# Patient Record
Sex: Female | Born: 1940 | Race: White | Hispanic: No | Marital: Married | Smoking: Never smoker
Health system: Southern US, Community
[De-identification: ages and names within clinical notes are randomized; demographics above are authoritative.]

## PROBLEM LIST (undated history)

## (undated) DIAGNOSIS — M1990 Unspecified osteoarthritis, unspecified site: Secondary | ICD-10-CM

## (undated) DIAGNOSIS — R011 Cardiac murmur, unspecified: Secondary | ICD-10-CM

## (undated) DIAGNOSIS — I639 Cerebral infarction, unspecified: Secondary | ICD-10-CM

## (undated) HISTORY — DX: Cerebral infarction, unspecified: I63.9

## (undated) HISTORY — PX: APPENDECTOMY: SHX54

## (undated) HISTORY — PX: ABDOMINAL HYSTERECTOMY: SHX81

---

## 1999-07-15 ENCOUNTER — Encounter (INDEPENDENT_AMBULATORY_CARE_PROVIDER_SITE_OTHER): Payer: Self-pay | Admitting: Specialist

## 1999-07-15 ENCOUNTER — Observation Stay (HOSPITAL_COMMUNITY): Admission: RE | Admit: 1999-07-15 | Discharge: 1999-07-16 | Payer: Self-pay | Admitting: Orthopedic Surgery

## 1999-08-17 ENCOUNTER — Other Ambulatory Visit: Admission: RE | Admit: 1999-08-17 | Discharge: 1999-08-17 | Payer: Self-pay | Admitting: Obstetrics & Gynecology

## 2000-09-07 ENCOUNTER — Other Ambulatory Visit: Admission: RE | Admit: 2000-09-07 | Discharge: 2000-09-07 | Payer: Self-pay | Admitting: Obstetrics & Gynecology

## 2001-09-07 ENCOUNTER — Ambulatory Visit (HOSPITAL_COMMUNITY): Admission: RE | Admit: 2001-09-07 | Discharge: 2001-09-07 | Payer: Self-pay | Admitting: Gastroenterology

## 2001-09-07 ENCOUNTER — Encounter (INDEPENDENT_AMBULATORY_CARE_PROVIDER_SITE_OTHER): Payer: Self-pay

## 2001-09-17 ENCOUNTER — Other Ambulatory Visit: Admission: RE | Admit: 2001-09-17 | Discharge: 2001-09-17 | Payer: Self-pay | Admitting: Obstetrics & Gynecology

## 2002-03-04 ENCOUNTER — Observation Stay (HOSPITAL_COMMUNITY): Admission: RE | Admit: 2002-03-04 | Discharge: 2002-03-05 | Payer: Self-pay | Admitting: Obstetrics & Gynecology

## 2002-11-11 ENCOUNTER — Other Ambulatory Visit: Admission: RE | Admit: 2002-11-11 | Discharge: 2002-11-11 | Payer: Self-pay | Admitting: Obstetrics & Gynecology

## 2003-10-21 ENCOUNTER — Other Ambulatory Visit: Admission: RE | Admit: 2003-10-21 | Discharge: 2003-10-21 | Payer: Self-pay | Admitting: Obstetrics & Gynecology

## 2004-11-14 HISTORY — PX: JOINT REPLACEMENT: SHX530

## 2004-11-25 ENCOUNTER — Other Ambulatory Visit: Admission: RE | Admit: 2004-11-25 | Discharge: 2004-11-25 | Payer: Self-pay | Admitting: Obstetrics & Gynecology

## 2004-12-10 ENCOUNTER — Encounter: Admission: RE | Admit: 2004-12-10 | Discharge: 2004-12-10 | Payer: Self-pay | Admitting: Neurosurgery

## 2004-12-24 ENCOUNTER — Encounter: Admission: RE | Admit: 2004-12-24 | Discharge: 2004-12-24 | Payer: Self-pay | Admitting: Neurosurgery

## 2005-01-06 ENCOUNTER — Encounter: Admission: RE | Admit: 2005-01-06 | Discharge: 2005-01-06 | Payer: Self-pay | Admitting: Neurosurgery

## 2005-02-17 ENCOUNTER — Encounter: Admission: RE | Admit: 2005-02-17 | Discharge: 2005-02-17 | Payer: Self-pay | Admitting: Neurosurgery

## 2005-03-15 ENCOUNTER — Encounter: Admission: RE | Admit: 2005-03-15 | Discharge: 2005-03-15 | Payer: Self-pay | Admitting: Orthopedic Surgery

## 2005-05-24 ENCOUNTER — Inpatient Hospital Stay (HOSPITAL_COMMUNITY): Admission: RE | Admit: 2005-05-24 | Discharge: 2005-05-26 | Payer: Self-pay | Admitting: Orthopedic Surgery

## 2005-12-22 ENCOUNTER — Other Ambulatory Visit: Admission: RE | Admit: 2005-12-22 | Discharge: 2005-12-22 | Payer: Self-pay | Admitting: Obstetrics & Gynecology

## 2007-10-15 HISTORY — PX: JOINT REPLACEMENT: SHX530

## 2008-10-28 ENCOUNTER — Inpatient Hospital Stay (HOSPITAL_COMMUNITY): Admission: RE | Admit: 2008-10-28 | Discharge: 2008-10-30 | Payer: Self-pay | Admitting: Orthopedic Surgery

## 2009-09-29 ENCOUNTER — Encounter: Admission: RE | Admit: 2009-09-29 | Discharge: 2009-09-29 | Payer: Self-pay | Admitting: Orthopedic Surgery

## 2009-10-19 ENCOUNTER — Encounter (INDEPENDENT_AMBULATORY_CARE_PROVIDER_SITE_OTHER): Payer: Self-pay | Admitting: Orthopedic Surgery

## 2009-10-19 ENCOUNTER — Inpatient Hospital Stay (HOSPITAL_COMMUNITY): Admission: RE | Admit: 2009-10-19 | Discharge: 2009-10-21 | Payer: Self-pay | Admitting: Orthopedic Surgery

## 2010-01-11 ENCOUNTER — Inpatient Hospital Stay (HOSPITAL_COMMUNITY): Admission: RE | Admit: 2010-01-11 | Discharge: 2010-01-12 | Payer: Self-pay | Admitting: Orthopedic Surgery

## 2010-02-12 HISTORY — PX: EYE SURGERY: SHX253

## 2010-08-23 ENCOUNTER — Encounter
Admission: RE | Admit: 2010-08-23 | Discharge: 2010-09-02 | Payer: Self-pay | Source: Home / Self Care | Admitting: Orthopedic Surgery

## 2011-02-02 LAB — BASIC METABOLIC PANEL
BUN: 17 mg/dL (ref 6–23)
Creatinine, Ser: 0.53 mg/dL (ref 0.4–1.2)
GFR calc Af Amer: >60 mL/min (ref >60)
GFR calc non Af Amer: >60 mL/min (ref >60)

## 2011-02-02 LAB — TYPE AND SCREEN
ABO/RH(D): A POS
Antibody Screen: NEGATIVE

## 2011-02-02 LAB — URINALYSIS, ROUTINE W REFLEX MICROSCOPIC
Ketones, ur: NEGATIVE mg/dL
Nitrite: NEGATIVE
Protein, ur: NEGATIVE mg/dL
pH: 7 (ref 5.0–8.0)

## 2011-02-02 LAB — DIFFERENTIAL
Eosinophils Absolute: 0.1 10*3/uL (ref 0.0–0.7)
Lymphocytes Relative: 26 % (ref 12–46)
Lymphs Abs: 0.9 10*3/uL (ref 0.7–4.0)
Neutro Abs: 2 10*3/uL (ref 1.7–7.7)
Neutrophils Relative %: 57 % (ref 43–77)

## 2011-02-02 LAB — CBC
Platelets: 347 10*3/uL (ref 150–400)
WBC: 3.6 10*3/uL — ABNORMAL LOW (ref 4.0–10.5)

## 2011-02-02 LAB — APTT: aPTT: 29 seconds (ref 24–37)

## 2011-02-02 LAB — PROTIME-INR
INR: 1.01 (ref 0.00–1.49)
Prothrombin Time: 13.2 seconds (ref 11.6–15.2)

## 2011-02-06 LAB — BASIC METABOLIC PANEL
BUN: 8 mg/dL (ref 6–23)
GFR calc non Af Amer: >60 mL/min (ref >60)
Glucose, Bld: 98 mg/dL (ref 70–99)
Potassium: 4.3 mEq/L (ref 3.5–5.1)

## 2011-02-06 LAB — CBC
HCT: 27.6 % — ABNORMAL LOW (ref 36.0–46.0)
Platelets: 235 10*3/uL (ref 150–400)
RDW: 14.9 % (ref 11.5–15.5)

## 2011-02-15 LAB — BASIC METABOLIC PANEL
CO2: 23 mEq/L (ref 19–32)
CO2: 24 mEq/L (ref 19–32)
Calcium: 8.1 mg/dL — ABNORMAL LOW (ref 8.4–10.5)
Chloride: 101 mEq/L (ref 96–112)
Chloride: 102 mEq/L (ref 96–112)
Creatinine, Ser: 0.62 mg/dL (ref 0.4–1.2)
GFR calc Af Amer: >60 mL/min (ref >60)
GFR calc Af Amer: >60 mL/min (ref >60)
GFR calc non Af Amer: >60 mL/min (ref >60)
Glucose, Bld: 98 mg/dL (ref 70–99)
Potassium: 4.1 mEq/L (ref 3.5–5.1)
Potassium: 4.2 mEq/L (ref 3.5–5.1)
Sodium: 131 mEq/L — ABNORMAL LOW (ref 135–145)
Sodium: 136 mEq/L (ref 135–145)

## 2011-02-15 LAB — URINE MICROSCOPIC-ADD ON

## 2011-02-15 LAB — TYPE AND SCREEN: Antibody Screen: NEGATIVE

## 2011-02-15 LAB — CBC
HCT: 31.2 % — ABNORMAL LOW (ref 36.0–46.0)
HCT: 41.4 % (ref 36.0–46.0)
Hemoglobin: 10.5 g/dL — ABNORMAL LOW (ref 12.0–15.0)
Hemoglobin: 14 g/dL (ref 12.0–15.0)
MCHC: 33.6 g/dL (ref 30.0–36.0)
MCHC: 33.9 g/dL (ref 30.0–36.0)
MCV: 90.6 fL (ref 78.0–100.0)
MCV: 90.7 fL (ref 78.0–100.0)
Platelets: 307 10*3/uL (ref 150–400)
RBC: 3.45 MIL/uL — ABNORMAL LOW (ref 3.87–5.11)
RDW: 13.7 % (ref 11.5–15.5)
WBC: 4.7 10*3/uL (ref 4.0–10.5)
WBC: 5.8 10*3/uL (ref 4.0–10.5)

## 2011-02-15 LAB — WOUND CULTURE

## 2011-02-15 LAB — URINALYSIS, ROUTINE W REFLEX MICROSCOPIC
Glucose, UA: NEGATIVE mg/dL
Nitrite: NEGATIVE
Protein, ur: NEGATIVE mg/dL

## 2011-02-15 LAB — GRAM STAIN

## 2011-02-15 LAB — PROTIME-INR: Prothrombin Time: 13 seconds (ref 11.6–15.2)

## 2011-02-15 LAB — ANAEROBIC CULTURE

## 2011-02-15 LAB — DIFFERENTIAL
Eosinophils Relative: 1 % (ref 0–5)
Lymphocytes Relative: 22 % (ref 12–46)
Lymphs Abs: 1 10*3/uL (ref 0.7–4.0)
Monocytes Absolute: 0.4 10*3/uL (ref 0.1–1.0)

## 2011-02-15 LAB — APTT: aPTT: 29 seconds (ref 24–37)

## 2011-03-29 NOTE — Op Note (Signed)
[name], LEJEUNE NO.:  [id number]   MEDICAL RECORD NO.:  [id number]          PATIENT TYPE:  INP   LOCATION:  1614                         FACILITY:  [facility]   PHYSICIAN:  Madlyn Frankel. Charlann Boxer, M.D.  DATE OF BIRTH:  1941-09-10   DATE OF PROCEDURE:  10/28/2008  DATE OF DISCHARGE:                               OPERATIVE REPORT   PREOPERATIVE DIAGNOSIS:  Right hip osteoarthritis.   POSTOPERATIVE DIAGNOSIS:  Right hip osteoarthritis.   PROCEDURE:  Right total hip replacement.   COMPONENTS USED:  DePuy hip system size 46 ASR cup, a 2 high Trilock  stem with a 41 +2 SR ball and adapter.   SURGEON:  Madlyn Frankel. Charlann Boxer, M.D.   ASSISTANT:  Yetta Glassman. Loreta Ave, PA   ANESTHESIA:  General.   BLOOD LOSS:  400.   DRAINS:  One Hemovac.   COMPLICATIONS:  None.   SPECIMENS:  None.   INDICATIONS FOR PROCEDURE:  [name] is a very pleasant 70 year old  patient of mine with a history of left total hip replacement.  She  presented recently with progressive arthritic changes noted  radiographically.  She became more and more symptomatic with this and  decided to proceed with hip replacement surgery.  We reviewed the risks  of infection, DVT, component failure, dislocation of the bearing surface  tissues.  Consent was obtained for the right total hip replacement.   PROCEDURE IN DETAIL:  The patient was brought to the operative theater.  Once adequate anesthesia preoperative antibiotics administered the  patient was positioned carefully in the left lateral decubitus position  with the right side up.  We pre scrubbed the right lateral hip and then  prepped and draped in sterile fashion.  Time-out was performed  identifying the patient and extremity.  The lateral based incision was  then made on the lateral side of the hip.  Sharp dissection was carried  down to the iliotibial band and gluteal fascia.  The posterior incision  was then made for posterior approach to the hip.  The  short external  rotators were identified and taken down separate from the posterior  capsule.  An L capsulotomy was made preserving the posterior capsule to  protect the sciatic nerve from retractors and also to repair  anatomically.  The hip was dislocated and neck osteotomy made utilizing  a broach with a smaller ball identifying the center of the femoral head  to match the anatomy.   Severe degenerative changes were noted on the femoral and acetabular  side.  Following neck osteotomy attention was directed first to the  femur.  Femoral retractors were placed, using starting drill and hand  reamer once the irrigation I began broaching with a zero broach set at  anteversion of 15 degrees.  She was fairly neutral femoral neck and I  was able to get about 15 degrees anteversion.  I then broached up to  size 2 with good proximal fit and fill.  I then attended to the  acetabulum and packed off the femur with a sponge to prevent oozing.  Acetabular retractors were placed.  Labrum was removed.  I then began  reaming with a 43 reamer.  The pelvis was very small and I was only able  to ream comfortably with a 45 reamer.  I did not want to ream further as  I did not want to compromise her posterior wall.  Given this I went  ahead and chose to use an ASR component as we were using the  contralateral hip which was the same size at 46.  A 46 ASR cup was  impacted at approximately 35-40 degrees of abduction with posterior and  lateral aspect of the cup exposed.  It was beneath the anterior wall  anteriorly.  It appeared to be well seated with a circumferential bony  contact noted.  At this point I checked the cup position and was secured  into position of the cup.  The trial reduction now cut with a 2 broach  and a high offset neck and a 41 +2 adapter.  The hip appeared to be very  stable through all range of motion with a combined anteversion noted to  be about 45-50 degrees.  There was no evidence  of impingement with  forward flexion, internal rotation nor with external rotation or  extension.  Given these parameters the trial femoral components were  removed.  The final Trilock size 2 high offset stem was impacted.  It  sat at the level of the broach and based on this I used a 41 +2 adapter.  Hip was then reduced, the hip ball was impacted with a clean dry  trunnion, the hip was reduced.  Hip was irrigated throughout the case  and at this point I reapproximated the posterior capsule superior  leaflet using #1 Vicryl.  Medium Hemovac drain was placed deep.  The  remainder of the wound was closed with #1 Vicryl and the iliotibial band  and gluteal fascia, 2-0 Vicryl was used in the subcu layer followed by 4-  0 running Monocryl.  Hip was cleaned, dried, dressed sterilely with  Steri-Strips and a Mepilex dressing.  She was brought to the recovery  room in stable condition.      Madlyn Frankel Charlann Boxer, M.D.  Electronically Signed     MDO/MEDQ  D:  10/28/2008  T:  10/29/2008  Job:  [id number]

## 2011-03-29 NOTE — H&P (Signed)
NAME:  [name], [name] NO.:  [id number]   MEDICAL RECORD NO.:  [id number]          PATIENT TYPE:  INP   LOCATION:  NA                           FACILITY:  [facility]   PHYSICIAN:  Madlyn Frankel. Charlann Boxer, M.D.  DATE OF BIRTH:  1941-07-21   DATE OF ADMISSION:  10/28/2008  DATE OF DISCHARGE:                              HISTORY & PHYSICAL   PROCEDURE:  Right total hip replacement.   CHIEF COMPLAINTS:  Right hip pain.   HISTORY OF PRESENT ILLNESS:  A 70 year old female with a history of  right hip pain secondary to osteoarthritis.  This has been refractory to  all conservative treatment.  Has been presurgically assessed by her  primary care physician, Dr. Martha Clan with full cardiac evaluation by  [facility] and Vascular Dr. Alanda Amass.   Her primary care physician, Dr. Martha Clan.  Other physicians include  Dr. Varney Baas, Dr. Caprice Kluver.   PAST MEDICAL HISTORY:  Significant for  1. Osteoarthritis.  2. Heart murmur.   PAST SURGICAL HISTORY:  Left total hip replacement 2006, hysterectomy,  foot surgery, appendectomy, vein stripping.   MEDICATIONS:  1. Aspirin 81 mg daily  2. Multivitamin daily.  3. Vitamin D 3 400 units daily.  4. Calcium plus vitamin D daily.  5. Estropipate 81.5 mg daily.   REVIEW OF SYSTEMS:  None other than HPI.   PHYSICAL EXAMINATION:  VITAL SIGNS:  Pulse 72, respirations 16, blood  pressure 116/78.  GENERAL:  Awake, alert and oriented, well-developed, well-nourished, no  acute distress.  HEENT: Normocephalic.  NECK: Supple.  No carotid bruits.  CHEST/LUNGS:  Clear to auscultation bilaterally.  BREASTS:  Deferred.  HEART:  Regular rate and rhythm.  S1-S2 distinct.  ABDOMEN:  Soft, nontender, nondistended.  Bowel sounds present.  PELVIS:  Stable.  GENITOURINARY:  Deferred.  EXTREMITIES:  Right hip has decreased range of motion increased pain.  SKIN:  No cellulitis.  NEUROLOGIC:  Intact distal sensibilities.   LABORATORY DATA:   Labs, EKG, chest x-ray all pending presurgical  testing.   IMPRESSION:  Right hip osteoarthritis.   PLAN OF ACTION:  Right total hip replacement [facility] on  October 28, 2008 by surgeon Dr. Durene Romans.  Risks, complication were  discussed.   Postop medications were provided today including aspirin, Robaxin,  Celebrex, Ambien, iron, Colace, MiraLax.     ______________________________  Yetta Glassman. Loreta Ave, [place]      Madlyn Frankel. Charlann Boxer, M.D.  Electronically Signed    BLM/MEDQ  D:  10/22/2008  T:  10/23/2008  Job:  [id number]   cc:   Kari Baars, M.D.  Fax: [phone]   W. Varney Baas, M.D.  Fax: [phone]   Thereasa Solo. Little, M.D.  Fax: [phone]

## 2011-04-01 NOTE — Procedures (Signed)
[facility]  Patient:    [name], [name] Visit Number: [id number] MRN: [id number]          Service Type: END Location: ENDO Attending Physician:  Nelda Marseille Proc. Date: 09/07/01 Admit Date:  09/07/2001 Discharge Date: 09/07/2001   CC:         Izell Garrett. Elesa Hacker, M.D.  Desma Maxim, M.D.   Procedure Report  PROCEDURE:  Colonoscopy with polypectomy.  INDICATION:  Patient due for colonic screening.  Consent was signed after risks, benefits methods and options thoroughly discussed in the office.  MEDICATIONS:  Demerol 70, Versed 7.  DESCRIPTION OF PROCEDURE:  Rectal inspection is pertinent for small external hemorrhoids.  Digital exam was negative.  The pediatric video adjustable colonoscope was inserted and with minimal difficulty due to a tortuous, slightly looping colon, we were able to the cecum.  This did require some abdominal pressure and rolling her on her back.  On insertion in the sigmoid, a 2-3 mm polyp was seen, and one cold biopsy was obtained on insertion to mark the spot.  No other abnormalities were seen on insertion.  The cecum was identified by the appendiceal orifice and the ileocecal valve.  In fact, the scope was inserted a short ways into the terminal ileum which was normal. Photodocumentation was obtained.  The scope was slowly withdrawn.  The prep was adequate.  There was minimal liquid stool that required washing and suctioning.  On slow withdrawal through the colon, in both the distal ascending and proximal transverse, around the transverse were three tiny polyps, all of which were hot biopsied and put in the second container.  The scope was further withdrawn.  No additional polyps were seen as we slowly withdrew back to the sigmoid where we found the polyp seen on insertion, and this was hot biopsied x 2 and put in the first container.  The scope was further withdrawn.  No additional findings were seen as we  withdrew back to the rectum.  Once back in the rectum, the scope was retroflexed, pertinent for some internal hemorrhoids.  The scope was then straightened, air was suctioned, and the scope removed.  The patient tolerated the procedure well. There was no obvious immediate complications.  ENDOSCOPIC DIAGNOSES: 1. Internal and external small hemorrhoids. 2. Sigmoid smaller polyp hot biopsied on withdrawal, cold biopsied on    insertion. 3. Three tiny approximate hepatic flexure polyps, hot biopsied put in a second    container. 4. Otherwise within normal limits to the terminal ileum.  PLAN:  Await pathology to determine future colonic screening.  Happy to see back p.r.n.  Otherwise return care to Dr. Arvilla Market for the customary health care maintenance to include yearly rectals and guaiacs. Attending Physician:  Nelda Marseille DD:  09/07/01 TD:  09/09/01 Job: [id number]

## 2011-04-01 NOTE — Discharge Summary (Signed)
[name], [name] NO.:  [id number]   MEDICAL RECORD NO.:  [id number]          PATIENT TYPE:  INP   LOCATION:  1514                         FACILITY:  [facility]   PHYSICIAN:  Madlyn Frankel. Charlann Boxer, M.D.  DATE OF BIRTH:  1941/11/13   DATE OF ADMISSION:  05/24/2005  DATE OF DISCHARGE:                                 DISCHARGE SUMMARY   ADMITTING DIAGNOSIS:  End-stage osteoarthritis of the left hip.   DISCHARGE DIAGNOSES:  1.  End-stage osteoarthritis of the left hip.  2.  Very mild postoperative anemia.   OPERATION:  On May 24, 2005 the patient underwent DePuy hip system total  left total hip replacement arthroplasty; D. Ashley Akin PA-C assisted.   BRIEF HISTORY:  This 69 year old lady had progressive and persistent left  hip pain which was interfering with her day-to-day activities. She is an  extremely active lady who enjoys a lifestyle filled with sports, hiking,  etc. and this has markedly interfered with her enjoyment of those  activities. X-rays have shown degenerative changes in the left hip. Due to  her age and progression of her discomfort a joint aspiration was performed  on the left hip and fortunately these were all negative. [name] wanted  a definitive treatment for this problem, and after risks and benefits of  surgery were described to her she elected to go ahead with the above  procedure.   COURSE IN THE HOSPITAL:  The patient tolerated the surgical procedure quite  well, entered eagerly into the total hip protocol. She was able to maintain  50% weightbearing on the operative extremity with no difficulty whatsoever.  She accomplished a considerable amount of activity, ambulating in the hall  with minimal discomfort. She had an extremely positive attitude throughout  her hospital stay.   She was placed on Coumadin protocol postoperatively for prevention of DVT.   The wound remained dry without evidence of any infection. Calf was soft.  Neurovascular intact to the operative extremity. Once home health was  arranged and the durable medical equipment needed for home was ordered, it  was felt she could be maintained in her home environment and arrangements  were made for discharge.   LABORATORY VALUES IN THE HOSPITAL:  Hematologically showed a CBC  preoperatively which was completely within normal limits. Hemoglobin was  13.4, hematocrit was 38.9. Final hemoglobin showed a hemoglobin of 10.8 with  hematocrit of 30.7. Blood chemistries remained normal in her  hospitalization. Chest x-ray showed old calcified granulomatous changes with  no active disease and moderate scoliosis. Electrocardiogram was normal sinus  rhythm.   CONDITION ON DISCHARGE:  Improved, stable.   PLAN:  The patient is discharged to her home in the care of her family. She  is to continue with 50% weightbearing to the operative extremity for at  least 4-6 weeks. Return to see Dr. Charlann Boxer 2 weeks after the date of surgery.  May shower in 5 days, trying to keep the wound dry. Use dry dressing p.r.n.  Continue with home medications and diet. Prescriptions written at discharge  were Vicodin for discomfort, Robaxin  for muscle spasm, Coumadin for the  Coumadin protocol to use 4 weeks after the date of surgery, and Trinsicon  one b.i.d. x3 weeks. Recommend she get  an over-the-counter stool softener to take one daily while on iron.  Encouraged her to call us should she have any problems or questions. She is  a little apprehensive concerning the hip protocol and her day-to-day life. I  reassured her that the [facility] would diligently go step by step  with her concerning those precautions.       DLU/MEDQ  D:  05/26/2005  T:  05/26/2005  Job:  [id number]   cc:   Donia Guiles, M.D.  [address] [place]  [place] [address]  Fax: [phone]

## 2011-04-01 NOTE — Op Note (Signed)
NAME:  [name], GLADHILL NO.:  [id number]   MEDICAL RECORD NO.:  [id number]          PATIENT TYPE:  INP   LOCATION:  0001                         FACILITY:  [facility]   PHYSICIAN:  Madlyn Frankel. Charlann Boxer, M.D.  DATE OF BIRTH:  05/27/1941   DATE OF PROCEDURE:  05/24/2005  DATE OF DISCHARGE:                                 OPERATIVE REPORT   PREOPERATIVE DIAGNOSIS:  End-stage left hip osteoarthritis.   POSTOPERATIVE DIAGNOSIS:  End-stage left hip osteoarthritis.   PROCEDURE:  Left total hip replacement.   COMPONENT USED:  DePuy hip system with a 46 mm ASR cup with a femoral side S-  ROM 16 x 11, 36 plus 6 offset with a 16D small sleeve and a 41 mm ball with  a zero adaptor.   SURGEON:  Madlyn Frankel. Charlann Boxer, M.D.   ASSISTANT:  Cherly Beach, P.A.-C.   ANESTHESIA:  General.   SPECIMENS:  None.   ESTIMATED BLOOD LOSS:  200 mL.   DRAINS:  Drains x1.   COMPLICATIONS:  None.   INDICATIONS FOR PROCEDURE:  Ms. [name] is a very pleasant 70 year old female  who presented to the office for evaluation of left hip pain. She had been  worked up in the past for bursitis and back problems but x-ray had revealed  some evidence of degenerative changes in the left hip. She was referred for  evaluation. In the workup based on her age and progression of her discomfort  in her hip, a hip aspiration was performed to rule out infection and this  was negative. Based on these findings, conservative options were presented  to her. She feels they were not going to provide her any longstanding relief  and for that reason she chose to proceed with a left total hip replacement.  The risks and benefits were reviewed including bleeding, nerve damage,  dislocation, component failure. We spent time discussing bearing surfaces  including metal on metal, ceramic on ceramic reviewing the risks and  benefits of all. Consent was obtained for a left total hip replacement with  new technology ASR ball to  decrease wear and prevent dislocation.   DESCRIPTION OF PROCEDURE:  The patient was brought to the operative theater.  Once adequate anesthesia and preoperative antibiotics, 1 g of Ancef were  administered. The patient was positioned in the right lateral decubitus  position with the left side up. The left lower extremity was then prepped  and draped in a sterile fashion. This was followed in a prescrub with  Betadine.   Landmarks were identified and the lateral based incision was made for  posterior approach to the hip. Short external rotators were taken down  separate from the posterior capsule which was tagged and saved to help  protect the sciatic nerve and then later repaired with the superior leaflet.  Once the hip was exposed, the hip was dislocated. The patient's femoral head  was noted to have severe degenerative changes with deformity, flattening and  sclerosis with no cartilage over the superolateral surface. Next osteotomy  was then made. Preoperative templating revealed some asymmetry to the  femoral shaft compared to the metaphysis. For this reason, component  selection was an option. Based on her version which was very little in the  femoral neck and the size of her femoral neck proximally, I did not feel  that I had enough room to add anteversion with a Press-Fit wedge type stem.  For this reason, I chose to use the S-ROM components. Given this selection,  we proceeded with the acetabulum. Acetabular exposure was obtained in  routine fashion with retractors placed. Again the sciatic nerve protected  with a posterior capsular leaflet. With the acetabular exposed labrectomy  carried out, reaming commenced with a 43 reamer. I reamed down to the medial  wall and then reamed up sequentially to a 45 reamer with excellent purchase,  punctate bleeding bone. No cysts were visible for debridement. A 46 ASR cup  was then impacted to the level of the reaming. This was positioned in  the  anatomic position underneath the anterior wall. The cup position appeared to  be at about 40-45 degrees of abduction and 15-20 degrees of forward flexion.  Given the anatomy of her pelvis, this adduction was adequate as she will  flatten out in the standing position. With the final impaction and  inspection of the cup, attention was now directed to the femur. Femoral  exposure was obtained in a routine fashion with retractor placement. The  femur was prepared per protocol for the S-ROM stem with distal reaming to an  11 and then 3/4 of the way down 11.5. This gave me excellent cortical  purchase with good bone purchase. There was no way to proceed up further  than this. Proximal reaming was carried up to a D with good bony purchase  and stability. Milling was then carried out using the standard Hyacinth Meeker to a D  small. A trial 16D small sleeve was then placed and a trial reduction  carried out. Note that the reaming and positioning of the components was  based off of the tip of the trochanter. From preoperative templating, the  center of her femoral head was a few millimeters proximal to the tip of the  trochanter. We tried to mimic that in this point. With the plus 0 sleeve and  the 41 ball, the hip was extremely stable. Note that I did add initially 20  degrees of anteversion. This combined anteversion was perhaps greater than  25. I then backed the stem to a 10 degrees of anteversion giving me what I  felt to be femoral anteversion of about 20-25 degrees. With this in mind,  the hip was reduced. The hip was very stable with a combined anteversion of  about 45-50 degrees. The hip was stable in flexion, neutral abduction,  internal rotation to at least 80 degrees, stable in the sleep position with  adduction of 40 degrees and internal rotation at 30 degrees. The hip was  stable on extension and external rotation with no signs of impingement with amount of anteversion put in the system.  There was a millimeter of shucking.  The leg lengths appeared to be symmetric as determined from the preoperative  position of the lower extremities. Given the position of her pelvis again, I  did not try to match her knee position due to the adduction present. Given  the soft tissue tension and the position of components, I felt anatomic  position of her hip was restored. At this point, trial components removed,  the final components were brought onto the  field.   Final components were positioned with a 16D small sleeve followed by  positioning of the 16 x 11, 36 plus 6 stem adding about 10 degrees of  anteversion to the sleeve giving me again a combined femoral anteversion of  20-25 degrees. When the stem was impacted to its final level, the plus zero  adaptor was placed with the 41 head and then impacted onto a clean and dry  trunnion. The hip was reduced. The hip was copiously irrigated throughout  the course of the case and again at the end of this. The posterior capsule  was reapproximated at the superior leaflet. The medium hemovac drain was  then placed deep. The remainder of the wound was closed in layers. Using #1  Ethibond  on the iliotibial band, a running #1 Vicryl on the gluteus maximus fascia.  The wound was closed in layers with 4-0 Monocryl. The hip was then cleaned,  dried and dressed sterilely. Steri-Strips were applied, dressings and  sponges applied over adaptic. The patient was then extubated and brought to  the recovery room in stable condition.       MDO/MEDQ  D:  05/24/2005  T:  05/24/2005  Job:  [id number]

## 2011-04-01 NOTE — Op Note (Signed)
[facility]  Patient:    [name], [name] Visit Number: [id number] MRN: [id number]          Service Type: DSU Location: [phone] Attending Physician:  Minette Headland Dictated by:   Freddy Finner, M.D. Proc. 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  OPERATION:                    Combination paravaginal and Burch suspension of bladder.  SURGEON:                      Freddy Finner, M.D.  ASSISTANT:                    Guy Sandifer. Arleta Creek, M.D.  ANESTHESIA:                   Spinal.  COMPLICATIONS:                None.  ESTIMATED BLOOD LOSS:         Less than 100 cc.  INDICATIONS:                  Details of the present illness are recorded in the admission note.  DESCRIPTION OF PROCEDURE:     The patient was admitted on the morning of surgery.  She was given a bottle of Ceftan IV.  She was brought to the operating room, and there placed under adequate spinal anesthesia with intravenous sedation.  She was placed in the dorsolithotomy position using the Allens stirrup system with legs extended horizontally.  Abdominal, perineal, and vaginal prep were carried out with scrub followed by solution.  A 30-cc triple-lumen Foley was inserted into the bladder with sterile technique. Sterile drapes were then applied.  A lower abdominal transverse incision was made just above the symphysis and extended sharply down to fascia. Subcutaneous vessels were controlled with the Bovie.  The fascia was entered sharply and extended to the extent of skin incision.  A rectus sheath was developed superiorly and inferiorly with blunt and sharp dissection.  Rectus muscles were divided in the midline.  With very careful blunt  dissection, the space of Retzius was developed inferiorly and laterally.  The white line on the iliopsoas was exposed on either side.  With digital pressure vaginally, the vagina was identified.  Using 0 Vicryl and a UR6 needle, a total of four sutures were placed on each side virtually identically.  This approximated the vagina to the white line on each side.  A fifth suture, using the same suture material, was placed at the urethrovesical junction approximately 1 cm lateral to the junction and elevated to Coopers ligament.  There was minimal intraoperative blood loss.  At this point, the bladder was filled with irrigating solution.  A banana catheter was inserted and attached to the skin with silk suture.  The rectus muscles were then approximated with interrupted figure-of-eights of PDS.  The fascia was closed with running 0 PDS from angle-to-angle on either side.  The skin was closed with broad skin staples and  0.25 inch Steri-Strips.  The patient tolerated the operative procedure well and was awakened and taken to recovery in good condition. Dictated by:   Freddy Finner, M.D. Attending Physician:  Minette Headland DD:  03/04/02 TD:  03/04/02 Job: [id number]

## 2011-04-01 NOTE — Discharge Summary (Signed)
[name], KISSINGER NO.:  [id number]   MEDICAL RECORD NO.:  [id number]          PATIENT TYPE:  INP   LOCATION:  1614                         FACILITY:  [facility]   PHYSICIAN:  Madlyn Frankel. Charlann Boxer, M.D.  DATE OF BIRTH:  [birth year]   DATE OF ADMISSION:  10/28/2008  DATE OF DISCHARGE:  10/30/2008                               DISCHARGE SUMMARY   ADMITTING DIAGNOSES:  1. Osteoarthritis.  2. Heart murmur.   DISCHARGE DIAGNOSES:  1. Osteoarthritis.  2. Heart murmur.   HISTORY OF PRESENT ILLNESS:  A 70 year old female with a history of  right hip pain secondary to osteoarthritis, refractory to all  conservative treatment.   PROCEDURE:  A right total knee replacement by surgeon Dr. Durene Romans.  Assistant - Dwyane Luo, PAC.   CONSULTATIONS:  None.   LABORATORY DATA:  CBC final reading showed a white count of 7.5,  hemoglobin 10.3, hematocrit 30.3 and platelets 253.  White cell  differential all within normal limits.  Coagulation normal.  Metabolic  panel upon read showed her sodium of 134, potassium 3.6, glucose 138 and  creatinine 0.66.  Calcium 8.3.  UA was negative.   RADIOLOGY:  1. Portable pelvis showed satisfactory appearance of her right total      hip replacement.  2. Chest two view showed no acute cardiopulmonary process.   HOSPITAL COURSE:  The patient underwent a right total hip replacement  and was admitted to the orthopedic floor.  Her stay was unremarkable.  She remained hemodynamically orthopedically stable throughout her course  of stay.  The dressing was changed.  No significant drainage from the  wound, and Hemovac was discontinued.  She was neurovascularly intact for  lower throughout and made excellent progress with physical therapy.  When we saw her on day #2, she was doing well, pain was well controlled,  meeting all criteria for discharge home.   DISCHARGE DISPOSITION:  Discharged home with home health care PT in  stable and improved  condition.   DISCHARGE ACTIVITIES:  Weightbearing as tolerated with the use of a  rolling walker.   DISCHARGE DIET:  Heart healthy.   WOUND CARE:  Keep dry.   DISCHARGE MEDICATIONS:  1. Aspirin 325 mg p.o. b.i.d. x6 weeks.  2. Robaxin 500 mg p.o. q.6h. p.r.n. muscle spasm pain.  3. Iron 325 mg one p.o. t.i.d. x3 weeks.  4. Colace 100 mg p.o. b.i.d. p.r.n. constipation while on narcotics.  5. MiraLax 17gms p.o. daily. p.r.n. constipation.  6. Norco 7.5/325 one to 2 p.o. q.4-6h. p.r.n. pain.  7. Multivitamin daily.  8. Vitamin D 3 daily.  9. Calcium plus D daily.  10.over-the-counter daily.   DISCHARGE FOLLOW UP:  Follow up with Dr. Charlann Boxer at phone number [phone]  in 2 weeks for wound check.     ______________________________  Yetta Glassman. Loreta Ave, [place]      Madlyn Frankel. Charlann Boxer, M.D.  Electronically Signed    BLM/MEDQ  D:  11/25/2008  T:  11/25/2008  Job:  [id number]   cc:   Kari Baars, M.D.  Fax: [phone]   W.  Varney Baas, M.D.  Fax: [phone]   Thereasa Solo. Little, M.D.  Fax: [phone]

## 2011-04-01 NOTE — H&P (Signed)
NAME:  [name], [name]           ACCOUNT NO.:  [id number]   MEDICAL RECORD NO.:  [id number]           PATIENT TYPE:   LOCATION:                                 FACILITY:   PHYSICIAN:  Madlyn Frankel. Charlann Boxer, M.D.       DATE OF BIRTH:   DATE OF ADMISSION:  05/24/2005  DATE OF DISCHARGE:                                HISTORY & PHYSICAL   CHIEF COMPLAINT AND PRINCIPAL DIAGNOSIS:  Left hip end-stage osteoarthritis.   SECONDARY DIAGNOSES:  1.  History of bunionectomy.  2.  History of hysterectomy.  3.  History of appendectomy.   HISTORY OF PRESENT ILLNESS:  [name] is a very pleasant 70 year old  female who presents to the office for evaluation of left hip pain.  She had  radiographic workup indicating end stage degenerative changes.  At that  time, we reviewed risks and benefits of treatment options available to her  including the use of anti-inflammatories, a cane, possible hip injection.  After reviewing and attempting conservative measures which failed to provide  any longstanding relief, she wished to proceed with left total hip  replacement.  She presents to the office, on May 18, 2005, with no change in  her history.  No recent problems with upper respiratory, cardiac, or  pulmonary issues.   PAST MEDICAL HISTORY:  Unremarkable.   PAST SURGICAL HISTORY:  1.  History of bunionectomy.  2.  Hysterectomy.  3.  Appendectomy.   CURRENT MEDICATIONS:  Advil, multivitamins, and hormones.   DRUG ALLERGIES:  No known drug allergies.   FAMILY HISTORY:  Noncontributory.   REVIEW OF SYSTEMS:  As noted in HPI is negative.   PHYSICAL EXAMINATION:  VITAL SIGNS:  Temperature 98.4, blood pressure  140/72, pulse rate 72, respirations normal.  GENERAL:  [name] is awake, alert, and oriented, and extremely healthy 70-  year-old female.  HEAD/NECK:  Reveals no asymmetric examination.  She has no findings  concerning for stroke.  Examination of her neck reveals that she has no  bruits.   She has a soft supple neck with no palpable nodes.  CHEST:  Clear to auscultation bilaterally.  HEART:  Regular rate and rhythm with normal S1 S2 with no murmurs detected.  ABDOMEN:  Soft and nontender with positive bowel sounds.  Note, that a  complete abdominal exam is not carried out.  EXTREMITIES:  Reveals that she has limited left hip range of motion that is  painful.  She otherwise is neurovascularly intact.  She has 10 degrees of  internal rotation, 20-30 degrees of external producing some groin and  lateral hip pain.  SKIN:  Otherwise intact.   PREOPERATIVE LAB WORK:  Pending.   Her evaluation at [facility] radiographs reveal end stage left hip  osteoarthritis.   IMPRESSION:  End stage left hip osteoarthritis.   PLAN:  In the office today, I had a long discussion with [name] and her  husband regarding her surgery.  She had already been to the preoperative  instruction class regarding hip replacement.  We will plan on performing a  left total hip  replacement with __________  components and at this point  using either a metal-on-metal ASR or DePuy 36-mm metal-on-metal ball.  We  reviewed concerns for dislocations, prosthetic longevity and wear.  Risks  and benefits were reviewed.  Consent will be obtained.  She will be admitted  for same day surgery, May 24, 2005.       MDO/MEDQ  D:  05/18/2005  T:  05/18/2005  Job:  [id number]

## 2011-04-01 NOTE — Op Note (Signed)
[facility]  Patient:    [name], [name] Visit Number: [id number] MRN: [id number]          Service Type: DSU Location: 9300 [id number] Attending Physician:  Minette Headland Dictated by:   Freddy Finner, M.D. Proc. 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  OPERATION:  Bladder suspension with paravaginal repair and birch suspension of UV angle.  SURGEON:  Freddy Finner, M.D.  ASSISTANT:  Saunders Glance. Huntley Dec, M.D.  ANESTHESIA:  Spinal.  ESTIMATED BLOOD LOSS:  100 cc.  COMPLICATIONS:  None.  Details of the present illness are recorded in the admission note.  The patient was admitted on the morning of surgery.  She was placed in PAS hose. She was given a gram of Cefotan IV.  She was brought to the operating room and there placed under adequate spinal anesthesia.  Placed in the dorsolithotomy position with legs in Huntington stirrups.  Abdomen, perineum and vagina were prepped in the usual fashion with scrub followed by solution.  A 30 cc. Foley bulb was inserted. Dictated by:   Freddy Finner, M.D. Attending Physician:  Minette Headland DD:  03/05/02 TD:  03/05/02 Job: [id number]

## 2011-04-01 NOTE — H&P (Signed)
[facility]  Patient:    [name], [name] Visit Number: [id number] MRN: [id number]          Service Type: DSU Location: [phone] Attending Physician:  Minette Headland Dictated by:   Freddy Finner, M.D. 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  HISTORY:                      The patient is a 70 year old white married female, gravida 3, para 3, who had a hysterectomy many years ago.  In 1990, she had anterior and posterior vaginal repair for a second-degree cystourethrocele and first-degree rectocele.  She did very well with this until approximately 1999, at which time she had the onset of mixed incontinence with some urge symptoms and recurrent stress incontinence.  She has been demonstrated on physical examination to have paravaginal loss of support.  She is now admitted for a paravaginal/Birch repair of the bladder.  REVIEW OF SYSTEMS:            Is otherwise negative.  CURRENT MEDICATIONS:          Ortho-Est 1.25 mg q.d. and a multivitamin.  PAST SURGICAL HISTORY:        Include a hysterectomy, three vaginal births, the anterior and posterior repair as noted, and an appendectomy, and a breast biopsy.  She also had varicose vein stripping in 1976.  PAST MEDICAL HISTORY:         She is also known to have mitral valve prolapse which is essentially asymptomatic.  She has never had a blood transfusion.  SOCIAL HISTORY:               She does not use cigarettes.  Does not use alcohol.  FAMILY HISTORY:               Noncontributory.  PHYSICAL EXAMINATION  HEENT:                        Grossly within normal limits.  NECK:                         There is no palpable enlargement of the thyroid gland.  VITAL SIGNS:                  Blood pressure in the office was  128/80.  CHEST:                        Clear to auscultation.  HEART:                        Normal sinus rhythm, with a grade 2/6 early systolic murmur and an intermittent midsystolic click.  There is an occasional extra systole that the patient is unaware of.  ABDOMEN:                      Soft,  nontender, without appreciable organomegaly or palpable masses.  EXTREMITIES:                  Without clubbing, cyanosis, or edema.  PELVIC:                       Are noted above.  ASSESSMENT:                   Urinary incontinence, with loss of paravaginal                               support of the bladder.  PLAN:                         Birth/paravaginal repair of bladder. Dictated by:   Freddy Finner, M.D. Attending Physician:  Minette Headland DD:  03/03/02 TD:  03/03/02 Job: [id number]

## 2011-04-01 NOTE — Discharge Summary (Signed)
[facility]  Patient:    [name], [name] Visit Number: [id number] MRN: [id number]          Service Type: DSU Location: [phone] Attending Physician:  Minette Headland Dictated by:   Freddy Finner, M.D. Admit Date:  03/04/2002 Disc. Date: 03/05/02                             Discharge Summary  DISCHARGE DIAGNOSES:          Stress urinary incontinence secondary to loss of paravaginal support and loss of urethrovesical angle.  OPERATIVE PROCEDURE:          Paravaginal repair and suspension of urethrovesical neck.  COMPLICATIONS:                None intraoperatively or postoperatively.  DISPOSITION:                  The patient is in satisfactory to good condition at the time of discharge.  She is proceeding with voiding trials.  She is discharged home with appropriate materials for continued voiding trials.  She is to call for any significant drainage from the incision, fever above 100. She is to avoid heavy lifting.  She is given Percocet for pain 5 mg, 30 tablets, 1 or 2 every 4 to 6 hours as needed.  She is to use ibuprofen as needed.  She is to return to my office in 48 hours for staple removal.  HISTORY:                      Details of present illness, past history, Review of Systems, and physician exam are recorded in the admission note.  Admission physical exam was remarkable for mid systolic click and a grade 2/6 systolic murmur on auscultation of the heart, also remarkable for loss of lateral vaginal support of the bladder and for loss of the urethrovesical angle on pelvic exam.  LABORATORY DATA:              Laboratory values on this admission includes admission hemoglobin 13, postoperative hemoglobin 11.  Pro thrombin time, PTT, and INR were normal on admission.  Fasting glucose on admission was 97.  Lipid profile on admission showed a cholesterol 149, HDL 70, total cholesterol to HDL ratio 2.1.  Urinalysis was  normal.  HOSPITAL COURSE:              The patient was admitted on the morning of surgery.  She was treated perioperatively with IV Cefotan and with PAS hose. The above-desribed operative procedure was accomplished without intraoperative complications or significant difficulty.  Her postoperative course was entirely satisfactory and with no complications.  On the evening of her first postoperative day, her condition was satisfactory for discharge.  She was discharged to home with disposition as noted above. Dictated by:   Freddy Finner, M.D. Attending Physician:  Minette Headland DD:  03/05/02 TD:  03/05/02 Job: [phone] [id number]

## 2011-08-01 ENCOUNTER — Other Ambulatory Visit: Payer: Self-pay | Admitting: Orthopedic Surgery

## 2011-08-01 DIAGNOSIS — M1990 Unspecified osteoarthritis, unspecified site: Secondary | ICD-10-CM

## 2011-08-02 ENCOUNTER — Ambulatory Visit
Admission: RE | Admit: 2011-08-02 | Discharge: 2011-08-02 | Disposition: A | Discharge status: Home / Self Care | Payer: Medicare Other | Source: Ambulatory Visit | Attending: Orthopedic Surgery | Admitting: Orthopedic Surgery

## 2011-08-02 DIAGNOSIS — M1990 Unspecified osteoarthritis, unspecified site: Secondary | ICD-10-CM

## 2011-08-05 ENCOUNTER — Other Ambulatory Visit: Payer: Self-pay | Admitting: Orthopedic Surgery

## 2011-08-05 ENCOUNTER — Encounter (HOSPITAL_COMMUNITY): Payer: Medicare Other

## 2011-08-05 LAB — URINALYSIS, ROUTINE W REFLEX MICROSCOPIC
Glucose, UA: NEGATIVE mg/dL
Hgb urine dipstick: NEGATIVE
Ketones, ur: NEGATIVE mg/dL
Protein, ur: NEGATIVE mg/dL
Urobilinogen, UA: 0.2 mg/dL (ref 0.0–1.0)

## 2011-08-05 LAB — BASIC METABOLIC PANEL
BUN: 24 mg/dL — ABNORMAL HIGH (ref 6–23)
CO2: 25 mEq/L (ref 19–32)
Calcium: 10.1 mg/dL (ref 8.4–10.5)
Creatinine, Ser: 0.57 mg/dL (ref 0.50–1.10)
GFR calc non Af Amer: >60 mL/min (ref >60)
Glucose, Bld: 84 mg/dL (ref 70–99)

## 2011-08-05 LAB — PROTIME-INR: Prothrombin Time: 13 seconds (ref 11.6–15.2)

## 2011-08-05 LAB — CBC
HCT: 39.2 % (ref 36.0–46.0)
MCHC: 34.2 g/dL (ref 30.0–36.0)
MCV: 94.7 fL (ref 78.0–100.0)
Platelets: 305 10*3/uL (ref 150–400)
RDW: 13 % (ref 11.5–15.5)
WBC: 4.6 10*3/uL (ref 4.0–10.5)

## 2011-08-05 LAB — DIFFERENTIAL
Basophils Absolute: 0.1 10*3/uL (ref 0.0–0.1)
Basophils Relative: 2 % — ABNORMAL HIGH (ref 0–1)
Eosinophils Absolute: 0.2 10*3/uL (ref 0.0–0.7)
Eosinophils Relative: 5 % (ref 0–5)
Lymphocytes Relative: 24 % (ref 12–46)
Monocytes Absolute: 0.5 10*3/uL (ref 0.1–1.0)

## 2011-08-05 LAB — SURGICAL PCR SCREEN: Staphylococcus aureus: NEGATIVE

## 2011-08-09 ENCOUNTER — Inpatient Hospital Stay (HOSPITAL_COMMUNITY)
Admission: RE | Admit: 2011-08-09 | Discharge: 2011-08-11 | DRG: 468 | Disposition: A | Discharge status: Home Health | Payer: Medicare Other | Source: Ambulatory Visit | Attending: Orthopedic Surgery | Admitting: Orthopedic Surgery

## 2011-08-09 ENCOUNTER — Inpatient Hospital Stay (HOSPITAL_COMMUNITY): Payer: Medicare Other

## 2011-08-09 DIAGNOSIS — Z01812 Encounter for preprocedural laboratory examination: Secondary | ICD-10-CM

## 2011-08-09 DIAGNOSIS — Z791 Long term (current) use of non-steroidal anti-inflammatories (NSAID): Secondary | ICD-10-CM

## 2011-08-09 DIAGNOSIS — R011 Cardiac murmur, unspecified: Secondary | ICD-10-CM | POA: Diagnosis present

## 2011-08-09 DIAGNOSIS — T84039A Mechanical loosening of unspecified internal prosthetic joint, initial encounter: Principal | ICD-10-CM | POA: Diagnosis present

## 2011-08-09 DIAGNOSIS — Y831 Surgical operation with implant of artificial internal device as the cause of abnormal reaction of the patient, or of later complication, without mention of misadventure at the time of the procedure: Secondary | ICD-10-CM | POA: Diagnosis present

## 2011-08-09 DIAGNOSIS — T56894A Toxic effect of other metals, undetermined, initial encounter: Secondary | ICD-10-CM | POA: Diagnosis present

## 2011-08-09 DIAGNOSIS — Z96649 Presence of unspecified artificial hip joint: Secondary | ICD-10-CM

## 2011-08-09 DIAGNOSIS — T5691XA Toxic effect of unspecified metal, accidental (unintentional), initial encounter: Secondary | ICD-10-CM | POA: Diagnosis present

## 2011-08-09 DIAGNOSIS — M1990 Unspecified osteoarthritis, unspecified site: Secondary | ICD-10-CM | POA: Diagnosis present

## 2011-08-09 LAB — TYPE AND SCREEN

## 2011-08-10 LAB — BASIC METABOLIC PANEL
BUN: 11 mg/dL (ref 6–23)
Calcium: 8.7 mg/dL (ref 8.4–10.5)
Creatinine, Ser: 0.49 mg/dL — ABNORMAL LOW (ref 0.50–1.10)
GFR calc Af Amer: >60 mL/min (ref >60)
GFR calc non Af Amer: >60 mL/min (ref >60)
Glucose, Bld: 119 mg/dL — ABNORMAL HIGH (ref 70–99)

## 2011-08-10 LAB — CBC
HCT: 27.4 % — ABNORMAL LOW (ref 36.0–46.0)
Hemoglobin: 9.5 g/dL — ABNORMAL LOW (ref 12.0–15.0)
MCH: 33 pg (ref 26.0–34.0)
MCHC: 34.7 g/dL (ref 30.0–36.0)
RDW: 12.9 % (ref 11.5–15.5)

## 2011-08-11 LAB — BASIC METABOLIC PANEL
Calcium: 8.5 mg/dL (ref 8.4–10.5)
Glucose, Bld: 101 mg/dL — ABNORMAL HIGH (ref 70–99)
Potassium: 3.8 mEq/L (ref 3.5–5.1)
Sodium: 138 mEq/L (ref 135–145)

## 2011-08-11 LAB — CBC
MCH: 32.3 pg (ref 26.0–34.0)
MCHC: 33.6 g/dL (ref 30.0–36.0)
Platelets: 238 10*3/uL (ref 150–400)
RDW: 13.2 % (ref 11.5–15.5)

## 2011-08-15 NOTE — H&P (Signed)
[name], [name] NO.:  [id number]  MEDICAL RECORD NO.:  [id number]  LOCATION:  1606                         FACILITY:  [facility]  PHYSICIAN:  Madlyn Frankel. Charlann Boxer, M.D.  DATE OF BIRTH:  04-04-1941  DATE OF ADMISSION:  08/09/2011 DATE OF DISCHARGE:                             HISTORY & PHYSICAL   CHIEF COMPLAINT:  Left hip pain with a history of previous left total hip revision.  HISTORY OF PRESENT ILLNESS:  The patient is a 70 year old white female who had a failed left total hip revision surgery in December 2010.  The patient presented to the orthopedic clinic in August of this year, complaining of left hip pain.  Due to the description of where the pain was, the patient was given a peritrochanteric bursa injection of steroids on her followup, this did little to relieve her pain.  The patient continued to worsen and even had to revert back to a cane to ambulate.  X-rays in the clinic show possible loosening of the acetabular component of the total hip with hollowing around the screw in the ileum.  A CT was obtained to confirm this.  Various options were discussed with the patient.  The patient wished to proceed with surgery. Risks, benefits, and expectations of procedure were discussed with the patient.  The patient understands the risks, benefits, and expectations, and wishes to proceed with a revision of the left total hip pain.  PAST MEDICAL HISTORY: 1. Osteoarthritis. 2. Heart murmur.  PAST SURGICAL HISTORY: 1. Right total hip replacement. 2. Left total hip replacement. 3. Revision of the right hip. 4. Revision of left hip replacement. 5. Hysterectomy. 6. Foot surgery.  MEDICATIONS: 1. Advil, which was stopped prior to surgery. 2. Ultracet 1-2 p.o. q.8 h. p.r.n. pain.  ALLERGIES:  No known drug allergies.  SOCIAL HISTORY:  Unremarkable.  REVIEW OF SYSTEMS:  The patient complaining of heart murmur.  The patient also complaining of left hip  pain, otherwise unremarkable.  PHYSICAL EXAMINATION:  GENERAL:  The patient is a 70 year old white female, in no acute distress. VITAL SIGNS:  Benign. HEENT:  Pupils are equal, round, reactive to light and accommodation. Throat is clear. NECK:  Supple.  No carotid bruits.  No JVD noted. CHEST:  Clear to auscultation bilaterally. CARDIAC:  Distinct S1, S2. NEURO:  The patient is oriented x3. ORTHO:  Pertaining to the left leg, the patient does have tenderness in the left leg, with any range of motion, the patient has good sensation distally.  The patient is distally neurovascularly intact.  The patient has posterior dorsalis pedis pulse.  Surgical site looks clear.  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  PLAN:  The patient admitted to hospital to undergo a revision of a left total hip replacement.  Risks, benefits, and expectations of procedure were discussed with the patient.  The patient understands the risks, benefits, and expectations, and wishes to proceed with surgery.    ______________________________ Lanney Gins, PA   ______________________________ Madlyn Frankel. Charlann Boxer, M.D.    MB/MEDQ  D:  08/09/2011  T:  08/10/2011  Job:  [id number]  Electronically Signed by Lanney Gins PA on 08/13/2011 02:41:49 AM Electronically Signed by Durene Romans M.D. on 08/15/2011 09:06:04 AM

## 2011-08-15 NOTE — Op Note (Signed)
  NAMETEALA, DAFFRON NO.:  [id number]  MEDICAL RECORD NO.:  [id number]  LOCATION:  1606                         FACILITY:  [facility]  PHYSICIAN:  Madlyn Frankel. Charlann Boxer, M.D.  DATE OF BIRTH:  Oct 26, 1941  DATE OF PROCEDURE:  08/09/2011 DATE OF DISCHARGE:                              OPERATIVE REPORT   ADDENDUM:  Physician Assistant, Lanney Gins, was utilized the entirety of the case with Ms. Lisanti from preoperative positioning, perioperative retractor management, and soft tissue management in leg holding and positioning as well as primarily involving the wound closure.     Madlyn Frankel Charlann Boxer, M.D.     MDO/MEDQ  D:  08/09/2011  T:  08/10/2011  Job:  [id number]  Electronically Signed by Durene Romans M.D. on 08/15/2011 09:05:52 AM

## 2011-08-15 NOTE — Op Note (Signed)
[name], [name] NO.:  [id number]  MEDICAL RECORD NO.:  [id number]  LOCATION:  1606                         FACILITY:  [facility]  PHYSICIAN:  Madlyn Frankel. Charlann Boxer, M.D.  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  No signs of infection.  PROCEDURE:  Revision of left total hip replacement utilizing a Zimmer size 54 multi-holed tantalum cup with a 36 neutral +3.5 mm buildup liner.  A DePuy S-ROM 16 x 11, 36 stem with a 36 +0 Delta ceramic ball. Also, utilized was autologous bone graft with demineralized bone matrix from [employer].  SURGEON:  Madlyn Frankel. Charlann Boxer, M.D.  ASSISTANT:  Lanney Gins, PA-C.  ANESTHESIA:  General.  SPECIMENS:  None.  COMPLICATIONS:  None.  DRAINS:  One Hemovac.  ESTIMATED BLOOD LOSS:  250 cc.  INDICATION OF THE PROCEDURE:  [name] is a very pleasant 70 year old female who has been a patient of mine since initially performing bilateral total hip replacements with ASR components.  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  At the time of revision, she was noted to have fairly poor bone stock.  Nonetheless, revision was carried out.  She had initially done well for over a year or more at this point until she presented recently with increasing pain.  Radiographs revealed concern for loosening of the acetabular cup with failure of bony ingrowth.  After presenting to her the current dilemma at hand, we reviewed  the risks, benefits, necessity of the planned procedure.  After discussing this, consent was obtained for the benefit of pain relief.  Extensive discussion regarding the postop course and expectation was carried out.  PROCEDURE IN DETAIL:  The patient was brought to operative theater. Once adequate anesthesia and preoperative antibiotics, Ancef administered, she was positioned into the right lateral decubitus position, left side up, the left lower extremity was then prepped and draped in the sterile fashion to allow for revision setting.  A time-out was performed identifying the patient, planned procedure, and extremity.  The old incision was identified, extended slightly distal, and the old incision ellipted out.  Sharp dissection was carried down to the iliotibial band and gluteal fascia, which I then incised posteriorly for posterior approach.  At this point, once I entered the pseudocapsule, identified some of this metal-stained fluid as well as some metal-stained synovial tissue.  A significant portion of this time period was addressed at synovectomy and debriding this metallosed tissue.  The acetabular shell was noted to be grossly loose, hip was dislocated following debridement around the proximal femur of this metal-stained tissue.  I removed the ceramic ball from the previous revision and then removed the S-ROM stem that was present.  This came out  without difficulty per protocol.  At this point, I was able to retract the femur anteriorly.  I removed the liner with a liner extractor and then removed the 3 screws from the acetabulum and then was able to just pull the acetabular component out. At this point, in evaluating the bone stock, other than the ischium which I am aware of its poor quality, the remainder of bone appeared to be intact.  Curette was used to initially remove some of the fibrous tissue.  I had previously had a 50 cup, which was floating around in  the space.  Her bone quality again was not perfect.  There was a small medial wall defect.  At this point, I reamed gently only up to about a 54 reamer, which seemed to sit within this acetabular area circumferentially.  The revision cup flares to 56 mm in diameter, thus I touched the outer rim with a 55 reamer.  The 54 cup was then opened and subsequently inserted, orienting the screws in an effort to get the best purchase on remaining bone stock.  I initially had hoped to get a screw into the pubis inferiorly knowing that I would not be able to get one into the ischium as well as into the iliac bone.  With the cup in position in about 20 degrees of flexion and about 35-40 degrees of abduction, I was able to get an excellent screw anterior into the ilium, 2 more into the posterior ilium that had good purchase. Everywhere else that I attempted to get purchase met with very poor bone stock.  I did get a 15 mm screw placed into the medial wall with a decent purchase to help provide some fixation at this distal point.  Following placement of all these screws in the cup, the cup and pelvis moved as a complete unit and I was satisfied with this fixation.  I placed a trial 36 neutral liner and did a trial reduction with the S- ROM components.  Given the fact that the trial liner was built up by 3.5 mm as well as the thickness of the shell, I ended up downsizing from a 36 +6 offset stem to a 36 standard with a 0 ball.  The trial reductions confirmed the orientation of the femoral stem necessary in addition to the ball size.  I chose the 16 x 11, 36 standard stem was then impacted close to 20 degrees of anteversion in relation of the sleeve, which is about 5 degrees anteverted.  36 +0 Delta ceramic ball was then impacted on the clean and dry trunnion following the trial reductions and this reduced.  The hip was irrigated throughout the case again at this point.  I noted that the final  liner was impacted and with this ring lock fixation the ring was floating.  We reapproximated posterior pseudocapsule with #1 Vicryl, placed a medium Hemovac drain deep, reapproximated the iliotibial band and gluteal fascia using #1 Vicryl.  The remaining of the wound was closed with 2-0 Vicryl and a running 4-0 Monocryl.  Hip was cleaned, dried, and dressed sterilely using Dermabond  and Aquacel dressing, drain site dressed separately.  She was then brought to recovery room in stable condition, tolerating the procedure well.     Madlyn Frankel Charlann Boxer, M.D.     MDO/MEDQ  D:  08/09/2011  T:  08/10/2011  Job:  [id number]  Electronically Signed by Durene Romans M.D. on 08/15/2011 09:06:13 AM

## 2011-08-15 NOTE — Discharge Summary (Signed)
NAMESHAGUN, WORDELL NO.:  [id number]  MEDICAL RECORD NO.:  [id number]  LOCATION:  1606                         FACILITY:  [facility]  PHYSICIAN:  Madlyn Frankel. Charlann Boxer, M.D.  DATE OF BIRTH:  Mar 30, 1941  DATE OF ADMISSION:  08/09/2011 DATE OF DISCHARGE:  08/11/2011                              DISCHARGE SUMMARY   PROCEDURE:  Left total hip revision.  ATTENDING PHYSICIAN:  Madlyn Frankel. Charlann Boxer, M.D.  ADMITTING DIAGNOSIS:  Left hip pain with a history of failure of the left total hip revision.  DISCHARGE DIAGNOSES: 1. Status post revision of left total hip arthroplasty. 2. Osteoarthritis. 3. Heart murmur.  HISTORY OF PRESENT ILLNESS:  The patient is a 70 year old white female who had a failed left total hip revision surgery in December 2010.  The patient presented to orthopedic clinic in August of this year complaining of left hip pain.  Due to description of where the pain was, the patient was given peritrochanteric bursa injection of steroids.  On a follow up, this did little to relieve her pain.  The patient continued to worsen even had to revert back to using a cane to ambulate. X-rays in the clinic do show a possible loosening of acetabular component of the total hip with haloing around the screw and the ilium. A CT was obtained to confirm this.  Various options were discussed with the patient.  The patient wished to proceed with surgery.  Risks, benefits, and expectations of procedure were discussed with the patient. The patient understands risks, benefits, and expectations and wishes to proceed with surgery.  HOSPITAL COURSE:  The patient underwent the above-stated procedure on August 09, 2011.  The patient tolerated the procedure well, was brought to the recovery room in good condition and subsequently to the floor.  Postop day #1, August 10, 2011.  The patient doing good.  No events. The patient's pain was well-controlled.  She was afebrile, vital  signs stable.  H and H were 9.5/27.4.  Dressing was good, clean, dry, and intact.  She was distally neurovascular intact.  Hemovac drain was taken out.  IV was changed to saline lock.  The patient had physical therapy.  Postop day #2, August 11, 2011.  The patient doing well, no events. Pain is well-controlled.  She is afebrile, vital signs stable.  H and H 9.5/28.3.  Dressings good, clean, dry, and intact.  She was distally neurovascular intact.  The patient also had physical therapy.  ID was subsequently seen.  The patient was felt to be doing well enough to be discharged home with home health PT after having physical therapy within the hospital.  DISCHARGE CONDITION:  Good.  DISCHARGE INSTRUCTIONS:  The patient will be 25% weightbearing on discharge from hospital with home health PT.  The patient should maintain her surgical dressing for about 8 days after which time she will replace with gauze and tape.  The patient is to keep the area dry and clean until followup.  The patient will follow up at [facility] in 2 weeks.  The patient is to call with any questions or concerns.  DISCHARGE MEDICATIONS: 1. Aspirin enteric-coated 325 mg one p.o. b.i.d. x4  weeks. 2. Colace 100 mg one p.o. b.i.d. constipation. 3. Iron sulfate 325 mg one p.o. t.i.d. x2-3 weeks. 4. Norco 5/325 1-2 p.o. q.4-6 h. p.r.n. pain. 5. MiraLax 70 g p.o. daily constipation. 6. Robaxin 500 mg one p.o. q.6 h. p.r.n. muscle spasms. 7. Calcium citrate with D one p.o. daily. 8. Estropipate 1.25 mg one p.o. q.a.m. 9. Multivitamin one p.o. daily.    ______________________________ Lanney Gins, PA   ______________________________ Madlyn Frankel. Charlann Boxer, M.D.    MB/MEDQ  D:  08/11/2011  T:  08/11/2011  Job:  [id number]  Electronically Signed by Lanney Gins PA on 08/13/2011 02:41:57 AM Electronically Signed by Durene Romans M.D. on 08/15/2011 09:06:18 AM

## 2011-08-19 LAB — URINALYSIS, ROUTINE W REFLEX MICROSCOPIC
Ketones, ur: NEGATIVE mg/dL
Nitrite: NEGATIVE
Protein, ur: NEGATIVE mg/dL
pH: 6 (ref 5.0–8.0)

## 2011-08-19 LAB — BASIC METABOLIC PANEL
BUN: 7 mg/dL (ref 6–23)
BUN: 7 mg/dL (ref 6–23)
CO2: 24 mEq/L (ref 19–32)
Calcium: 9.5 mg/dL (ref 8.4–10.5)
Chloride: 104 mEq/L (ref 96–112)
Chloride: 109 mEq/L (ref 96–112)
Creatinine, Ser: 0.66 mg/dL (ref 0.4–1.2)
GFR calc Af Amer: >60 mL/min (ref >60)
GFR calc non Af Amer: >60 mL/min (ref >60)
GFR calc non Af Amer: >60 mL/min (ref >60)
Glucose, Bld: 100 mg/dL — ABNORMAL HIGH (ref 70–99)
Glucose, Bld: 93 mg/dL (ref 70–99)
Potassium: 3.9 mEq/L (ref 3.5–5.1)
Sodium: 138 mEq/L (ref 135–145)

## 2011-08-19 LAB — CBC
HCT: 28.9 % — ABNORMAL LOW (ref 36.0–46.0)
Hemoglobin: 13.1 g/dL (ref 12.0–15.0)
MCV: 93.4 fL (ref 78.0–100.0)
MCV: 93.5 fL (ref 78.0–100.0)
Platelets: 253 10*3/uL (ref 150–400)
Platelets: 260 10*3/uL (ref 150–400)
RDW: 13.6 % (ref 11.5–15.5)
RDW: 13.8 % (ref 11.5–15.5)
WBC: 5 10*3/uL (ref 4.0–10.5)
WBC: 7.5 10*3/uL (ref 4.0–10.5)

## 2011-08-19 LAB — APTT: aPTT: 31 seconds (ref 24–37)

## 2011-08-19 LAB — DIFFERENTIAL
Basophils Absolute: 0.1 10*3/uL (ref 0.0–0.1)
Lymphocytes Relative: 18 % (ref 12–46)
Lymphs Abs: 0.9 10*3/uL (ref 0.7–4.0)
Monocytes Absolute: 0.6 10*3/uL (ref 0.1–1.0)
Neutro Abs: 3.3 10*3/uL (ref 1.7–7.7)

## 2011-08-19 LAB — PROTIME-INR: Prothrombin Time: 14 seconds (ref 11.6–15.2)

## 2011-10-28 ENCOUNTER — Other Ambulatory Visit: Payer: Self-pay | Admitting: Orthopedic Surgery

## 2011-10-28 ENCOUNTER — Ambulatory Visit
Admission: RE | Admit: 2011-10-28 | Discharge: 2011-10-28 | Disposition: A | Discharge status: Home / Self Care | Payer: Medicare Other | Source: Ambulatory Visit | Attending: Orthopedic Surgery | Admitting: Orthopedic Surgery

## 2011-10-28 DIAGNOSIS — M25552 Pain in left hip: Secondary | ICD-10-CM

## 2011-10-28 DIAGNOSIS — M25559 Pain in unspecified hip: Secondary | ICD-10-CM

## 2011-10-28 LAB — SYNOVIAL CELL COUNT + DIFF, W/ CRYSTALS
Crystals, Fluid: NONE SEEN
Neutrophil, Synovial: 89 % — ABNORMAL HIGH (ref 0–25)

## 2011-11-02 ENCOUNTER — Encounter (HOSPITAL_COMMUNITY): Payer: Self-pay | Admitting: Pharmacy Technician

## 2011-11-02 LAB — BODY FLUID CULTURE
Gram Stain: NONE SEEN
Organism ID, Bacteria: NO GROWTH

## 2011-11-02 NOTE — Progress Notes (Signed)
H&P dictated 11/02/11 Dictation # [id number] 

## 2011-11-02 NOTE — H&P (Signed)
[name], [name] NO.:  [id number]  MEDICAL RECORD NO.:  [id number]  LOCATION:  PERIO                        FACILITY:  [facility]  PHYSICIAN:  Madlyn Frankel. Charlann Boxer, M.D.  DATE OF BIRTH:  02-11-1941  DATE OF ADMISSION:  11/12/2011 DATE OF DISCHARGE:                             HISTORY & PHYSICAL   ADMITTING DIAGNOSIS:  Infected left hip.  HISTORY OF PRESENT ILLNESS:  This is a 70 year old lady with a history of a revision total hip in December 2010 who had loosening of the prosthesis and subsequent revision of the hip back in September.  At that time, she initially did well, but she recently has had redness and swelling in her wound and some discomfort anteriorly in the hip, and after aspiration and scanning, is noted to have infection of the hip. At this time, she is admitted to the hospital for removal of her arthroplasty, and at this time is scheduled for I and D and poly exchange.  At this time, the patient is scheduled for I and D, cultures, and component exchange.  The surgery, risks, benefits, and aftercare were discussed with the patient.  Questions invited and answered.  Note, that she is on Keflex and Bactrim DS at this time for the infection. Note that her medical doctor is Dr. Buren Kos and she is planning on going home after surgery.  We have contacted Dr. Lina Sayre who happens to be on call the day of her surgery on the 29th and he will be seeing her postoperatively for ID consult and help in management.  PAST MEDICAL HISTORY:  Drug allergies, none.  CURRENT MEDICATIONS: 1. Advil. 2. Vitamin D and calcium. 3. Aspirin. 4. Tylenol. 5. Estropipate.  MEDICAL ILLNESSES:  Heart murmur, which she has had for years with no problems.  Previous surgery, right total hip replacement, left total hip replacement, revision of right hip, revision of left hip x2, hysterectomy, and foot surgery.  FAMILY HISTORY:  Positive for CVA and  osteoporosis.  SOCIAL HISTORY:  The patient is married.  She lives at home.  She does not smoke and has occasional glass of wine.  REVIEW OF SYSTEMS:  CENTRAL NERVOUS SYSTEM:  Negative for headache, blurred vision, or dizziness.  PULMONARY:  Negative for shortness of breath, PND, or orthopnea.  CARDIOVASCULAR:  Negative chest pain, palpitation.  GI:  Negative for ulcers, hepatitis.  GU:  Negative for urinary tract difficulty.  MUSCULOSKELETAL:  Positive in In HPI.  PHYSICAL EXAMINATION:  VITAL SIGNS:  BP 118/80, pulse 72 and regular, and respirations 12. HEENT:  Head normocephalic.  Nose patent.  Ears patent.  Pupils equal, round, reactive to light.  Throat without injection.  NECK:  Supple without adenopathy.  Carotids 2+ without bruit. CHEST:  Clear to auscultation.  No rales or rhonchi.  Respirations 12. HEART:  Regular rate and rhythm at 72 beats per minute without murmur. ABDOMEN:  Soft.  Active bowel sounds.  No masses or organomegaly. NEUROLOGIC:  The patient alert and oriented to time, place, and person. Cranial nerves II through XII grossly intact. EXTREMITIES:  Shows the right hip with full extension, further flexion to 125 degrees.  The left hip  shows full extension, further flexion to 110 degrees with discomfort.  External rotation of 20 degrees, internal rotation of 10 degrees with discomfort.  Wound is slightly red. Neurovascular status intact.  ASSESSMENT:  Infected left total hip arthroplasty.  PLAN:  I and D, and revision left total hip arthroplasty.     Jaquelyn Bitter. Mliss Wedin, P.A.   ______________________________ Madlyn Frankel Charlann Boxer, M.D.    SJC/MEDQ  D:  11/02/2011  T:  11/02/2011  Job:  [id number]

## 2011-11-03 ENCOUNTER — Other Ambulatory Visit: Payer: Self-pay | Admitting: Orthopedic Surgery

## 2011-11-04 ENCOUNTER — Encounter (HOSPITAL_COMMUNITY)
Admission: RE | Admit: 2011-11-04 | Discharge: 2011-11-04 | Disposition: A | Discharge status: Home / Self Care | Payer: Medicare Other | Source: Ambulatory Visit | Attending: Orthopedic Surgery | Admitting: Orthopedic Surgery

## 2011-11-04 ENCOUNTER — Encounter (HOSPITAL_COMMUNITY): Payer: Self-pay

## 2011-11-04 HISTORY — DX: Cardiac murmur, unspecified: R01.1

## 2011-11-04 HISTORY — DX: Unspecified osteoarthritis, unspecified site: M19.90

## 2011-11-04 LAB — URINALYSIS, ROUTINE W REFLEX MICROSCOPIC
Bilirubin Urine: NEGATIVE
Glucose, UA: NEGATIVE mg/dL
Ketones, ur: NEGATIVE mg/dL
pH: 7 (ref 5.0–8.0)

## 2011-11-04 LAB — BASIC METABOLIC PANEL
CO2: 23 mEq/L (ref 19–32)
Calcium: 9.5 mg/dL (ref 8.4–10.5)
Chloride: 98 mEq/L (ref 96–112)
Creatinine, Ser: 0.69 mg/dL (ref 0.50–1.10)
GFR calc Af Amer: >90 mL/min (ref >90)
Sodium: 133 mEq/L — ABNORMAL LOW (ref 135–145)

## 2011-11-04 LAB — DIFFERENTIAL
Basophils Relative: 1 % (ref 0–1)
Eosinophils Absolute: 0.1 10*3/uL (ref 0.0–0.7)
Eosinophils Relative: 1 % (ref 0–5)
Monocytes Absolute: 0.6 10*3/uL (ref 0.1–1.0)
Monocytes Relative: 10 % (ref 3–12)

## 2011-11-04 LAB — ANAEROBIC CULTURE: Gram Stain: NONE SEEN

## 2011-11-04 LAB — CBC
MCV: 90.9 fL (ref 78.0–100.0)
RBC: 3.94 MIL/uL (ref 3.87–5.11)
RDW: 14.8 % (ref 11.5–15.5)
WBC: 5.9 10*3/uL (ref 4.0–10.5)

## 2011-11-04 LAB — APTT: aPTT: 32 seconds (ref 24–37)

## 2011-11-04 NOTE — Pre-Procedure Instructions (Signed)
EKG 06/12/2008 on chart, Chest 2 view 01/07/2009 [facility]), Echo 09/18/2008 from [facility] on chart.

## 2011-11-04 NOTE — Patient Instructions (Signed)
[address] [facility] [facility]  11/04/2011   Your procedure is scheduled on:  Sat. 11/12/2011  Report to [facility] at 0800 AM.  Call this number if you have problems the morning of surgery: [phone]   Remember: REPORT TO PACU DAY OF SURGERY -[phone]   Do not eat food:After Midnight.  May have clear liquids:until Midnight .  Clear liquids include soda, tea, black coffee, apple or grape juice, broth.  Take these medicines the morning of surgery with A SIP OF WATER:none   Do not wear jewelry, make-up or nail polish.  Do not wear lotions, powders, or perfumes.   Do not shave 48 hours prior to surgery.  Do not bring valuables to the hospital.  Contacts, dentures or bridgework may not be worn into surgery.  Leave suitcase in the car. After surgery it may be brought to your room.  For patients admitted to the hospital, checkout time is 11:00 AM the day of discharge.   Patients discharged the day of surgery will not be allowed to drive home.  Name and phone number of your driver:   Special Instructions: CHG Shower Use Special Wash: 1/2 bottle night before surgery and 1/2 bottle morning of surgery.   Please read over the following fact sheets that you were given: MRSA Information

## 2011-11-07 LAB — MRSA CULTURE

## 2011-11-10 NOTE — Pre-Procedure Instructions (Signed)
Patient called and informed her of time and date change for surgery . Now to report to Short Stay Surgery on Fri. 11/11/2011 at 0900 for surgery at 11:15 am. Patient verbalized understanding.

## 2011-11-11 ENCOUNTER — Inpatient Hospital Stay (HOSPITAL_COMMUNITY): Payer: Medicare Other | Admitting: *Deleted

## 2011-11-11 ENCOUNTER — Encounter (HOSPITAL_COMMUNITY): Payer: Self-pay | Admitting: *Deleted

## 2011-11-11 ENCOUNTER — Encounter (HOSPITAL_COMMUNITY)
Admission: RE | Disposition: A | Discharge status: Home Health | Payer: Self-pay | Source: Ambulatory Visit | Attending: Orthopedic Surgery

## 2011-11-11 ENCOUNTER — Inpatient Hospital Stay (HOSPITAL_COMMUNITY): Payer: Medicare Other

## 2011-11-11 ENCOUNTER — Inpatient Hospital Stay (HOSPITAL_COMMUNITY)
Admission: RE | Admit: 2011-11-11 | Discharge: 2011-11-13 | DRG: 468 | Disposition: A | Discharge status: Home Health | Payer: Medicare Other | Source: Ambulatory Visit | Attending: Orthopedic Surgery | Admitting: Orthopedic Surgery

## 2011-11-11 DIAGNOSIS — R011 Cardiac murmur, unspecified: Secondary | ICD-10-CM | POA: Diagnosis present

## 2011-11-11 DIAGNOSIS — Z01812 Encounter for preprocedural laboratory examination: Secondary | ICD-10-CM

## 2011-11-11 DIAGNOSIS — T8450XA Infection and inflammatory reaction due to unspecified internal joint prosthesis, initial encounter: Principal | ICD-10-CM | POA: Diagnosis present

## 2011-11-11 DIAGNOSIS — Y831 Surgical operation with implant of artificial internal device as the cause of abnormal reaction of the patient, or of later complication, without mention of misadventure at the time of the procedure: Secondary | ICD-10-CM | POA: Diagnosis present

## 2011-11-11 DIAGNOSIS — Z96649 Presence of unspecified artificial hip joint: Secondary | ICD-10-CM

## 2011-11-11 DIAGNOSIS — M00859 Arthritis due to other bacteria, unspecified hip: Secondary | ICD-10-CM

## 2011-11-11 HISTORY — PX: INCISION AND DRAINAGE HIP: SHX1801

## 2011-11-11 LAB — GRAM STAIN

## 2011-11-11 LAB — SYNOVIAL CELL COUNT + DIFF, W/ CRYSTALS
Eosinophils-Synovial: 0 % (ref 0–1)
Neutrophil, Synovial: 93 % — ABNORMAL HIGH (ref 0–25)
Other Cells-SYN: 0

## 2011-11-11 LAB — TYPE AND SCREEN: ABO/RH(D): A POS

## 2011-11-11 SURGERY — IRRIGATION AND DEBRIDEMENT HIP WITH POLY EXCHANGE
Anesthesia: General | Site: Hip | Laterality: Left | Wound class: Clean Contaminated

## 2011-11-11 MED ORDER — FLEET ENEMA 7-19 GM/118ML RE ENEM: 1.0000 | ENEMA | Freq: Once | RECTAL | Status: AC | PRN

## 2011-11-11 MED ORDER — FENTANYL CITRATE 0.05 MG/ML IJ SOLN
25.0000 ug | INTRAMUSCULAR | Status: DC | PRN
  Administered 2011-11-11: 25 ug via INTRAVENOUS

## 2011-11-11 MED ORDER — METOCLOPRAMIDE HCL 5 MG/ML IJ SOLN: 5.0000 mg | Freq: Three times a day (TID) | INTRAMUSCULAR | Status: DC | PRN

## 2011-11-11 MED ORDER — FENTANYL CITRATE 0.05 MG/ML IJ SOLN
INTRAMUSCULAR | Status: DC | PRN
  Administered 2011-11-11 (×5): 50 ug via INTRAVENOUS
  Administered 2011-11-11 (×2): 25 ug via INTRAVENOUS
  Administered 2011-11-11: 50 ug via INTRAVENOUS

## 2011-11-11 MED ORDER — ONDANSETRON HCL 4 MG/2ML IJ SOLN: 4.0000 mg | Freq: Four times a day (QID) | INTRAMUSCULAR | Status: DC | PRN

## 2011-11-11 MED ORDER — DIPHENHYDRAMINE HCL 25 MG PO CAPS: 25.0000 mg | ORAL_CAPSULE | Freq: Four times a day (QID) | ORAL | Status: DC | PRN

## 2011-11-11 MED ORDER — TOBRAMYCIN SULFATE 1.2 G IJ SOLR
INTRAMUSCULAR | Status: DC | PRN
  Administered 2011-11-11: 2.4 g

## 2011-11-11 MED ORDER — EPHEDRINE SULFATE 50 MG/ML IJ SOLN
INTRAMUSCULAR | Status: DC | PRN
  Administered 2011-11-11: 5 mg via INTRAVENOUS

## 2011-11-11 MED ORDER — METHOCARBAMOL 500 MG PO TABS: 500.0000 mg | ORAL_TABLET | Freq: Four times a day (QID) | ORAL | Status: DC | PRN

## 2011-11-11 MED ORDER — METOCLOPRAMIDE HCL 10 MG PO TABS: 5.0000 mg | ORAL_TABLET | Freq: Three times a day (TID) | ORAL | Status: DC | PRN

## 2011-11-11 MED ORDER — PROMETHAZINE HCL 25 MG/ML IJ SOLN: 6.2500 mg | INTRAMUSCULAR | Status: DC | PRN

## 2011-11-11 MED ORDER — LACTATED RINGERS IV SOLN: INTRAVENOUS | Status: DC | PRN

## 2011-11-11 MED ORDER — FENTANYL CITRATE 0.05 MG/ML IJ SOLN
INTRAMUSCULAR | Status: AC
  Filled 2011-11-11: qty 2

## 2011-11-11 MED ORDER — VANCOMYCIN HCL IN DEXTROSE 1-5 GM/200ML-% IV SOLN
1000.0000 mg | Freq: Two times a day (BID) | INTRAVENOUS | Status: AC
  Administered 2011-11-12: 1000 mg via INTRAVENOUS
  Filled 2011-11-11: qty 200

## 2011-11-11 MED ORDER — ACETAMINOPHEN 10 MG/ML IV SOLN
INTRAVENOUS | Status: AC
  Filled 2011-11-11: qty 100

## 2011-11-11 MED ORDER — RIVAROXABAN 10 MG PO TABS
10.0000 mg | ORAL_TABLET | ORAL | Status: DC
  Administered 2011-11-12 – 2011-11-13 (×2): 10 mg via ORAL
  Filled 2011-11-11 (×3): qty 1

## 2011-11-11 MED ORDER — LACTATED RINGERS IV SOLN
INTRAVENOUS | Status: AC
  Administered 2011-11-11: 1000 mL via INTRAVENOUS
  Administered 2011-11-11 (×2): via INTRAVENOUS

## 2011-11-11 MED ORDER — CHLORHEXIDINE GLUCONATE 4 % EX LIQD: 60.0000 mL | Freq: Once | CUTANEOUS | Status: DC

## 2011-11-11 MED ORDER — HYDROCODONE-ACETAMINOPHEN 7.5-325 MG PO TABS
1.0000 | ORAL_TABLET | ORAL | Status: DC
  Administered 2011-11-11: 1 via ORAL
  Filled 2011-11-11: qty 1

## 2011-11-11 MED ORDER — HYDROMORPHONE HCL PF 1 MG/ML IJ SOLN: 0.5000 mg | INTRAMUSCULAR | Status: DC | PRN

## 2011-11-11 MED ORDER — DEXAMETHASONE SODIUM PHOSPHATE 10 MG/ML IJ SOLN
INTRAMUSCULAR | Status: DC | PRN
  Administered 2011-11-11: 10 mg via INTRAVENOUS

## 2011-11-11 MED ORDER — BISACODYL 5 MG PO TBEC: 5.0000 mg | DELAYED_RELEASE_TABLET | Freq: Every day | ORAL | Status: DC | PRN

## 2011-11-11 MED ORDER — ACETAMINOPHEN 10 MG/ML IV SOLN
INTRAVENOUS | Status: DC | PRN
  Administered 2011-11-11: 1000 mg via INTRAVENOUS

## 2011-11-11 MED ORDER — MENTHOL 3 MG MT LOZG: 1.0000 | LOZENGE | OROMUCOSAL | Status: DC | PRN

## 2011-11-11 MED ORDER — ESTROPIPATE 1.5 MG PO TABS
1.5000 mg | ORAL_TABLET | Freq: Every day | ORAL | Status: DC
  Administered 2011-11-12 – 2011-11-13 (×2): 1.5 mg via ORAL
  Filled 2011-11-11 (×4): qty 1

## 2011-11-11 MED ORDER — POLYETHYLENE GLYCOL 3350 17 G PO PACK
17.0000 g | PACK | Freq: Two times a day (BID) | ORAL | Status: DC
  Filled 2011-11-11 (×7): qty 1

## 2011-11-11 MED ORDER — VANCOMYCIN HCL 1000 MG IV SOLR
1000.0000 mg | INTRAVENOUS | Status: DC | PRN
  Administered 2011-11-11: 1000 mg via INTRAVENOUS

## 2011-11-11 MED ORDER — FERROUS SULFATE 325 (65 FE) MG PO TABS
325.0000 mg | ORAL_TABLET | Freq: Three times a day (TID) | ORAL | Status: DC
  Administered 2011-11-11 – 2011-11-13 (×6): 325 mg via ORAL
  Filled 2011-11-11 (×10): qty 1

## 2011-11-11 MED ORDER — PROPOFOL 10 MG/ML IV EMUL
INTRAVENOUS | Status: DC | PRN
  Administered 2011-11-11: 150 mg via INTRAVENOUS

## 2011-11-11 MED ORDER — VANCOMYCIN HCL IN DEXTROSE 1-5 GM/200ML-% IV SOLN
INTRAVENOUS | Status: AC
  Filled 2011-11-11: qty 200

## 2011-11-11 MED ORDER — ACETAMINOPHEN 325 MG PO TABS
650.0000 mg | ORAL_TABLET | Freq: Four times a day (QID) | ORAL | Status: DC | PRN
  Administered 2011-11-12 – 2011-11-13 (×3): 650 mg via ORAL
  Filled 2011-11-11 (×2): qty 2

## 2011-11-11 MED ORDER — METHOCARBAMOL 100 MG/ML IJ SOLN
500.0000 mg | Freq: Four times a day (QID) | INTRAVENOUS | Status: DC | PRN
  Filled 2011-11-11: qty 5

## 2011-11-11 MED ORDER — ONDANSETRON HCL 4 MG/2ML IJ SOLN
INTRAMUSCULAR | Status: DC | PRN
  Administered 2011-11-11 (×2): 2 mg via INTRAVENOUS

## 2011-11-11 MED ORDER — ZOLPIDEM TARTRATE 5 MG PO TABS: 5.0000 mg | ORAL_TABLET | Freq: Every evening | ORAL | Status: DC | PRN

## 2011-11-11 MED ORDER — GLYCOPYRROLATE 0.2 MG/ML IJ SOLN
INTRAMUSCULAR | Status: DC | PRN
  Administered 2011-11-11: .2 mg via INTRAVENOUS

## 2011-11-11 MED ORDER — ONDANSETRON HCL 4 MG PO TABS: 4.0000 mg | ORAL_TABLET | Freq: Four times a day (QID) | ORAL | Status: DC | PRN

## 2011-11-11 MED ORDER — TOBRAMYCIN SULFATE 1.2 G IJ SOLR
INTRAMUSCULAR | Status: AC
  Filled 2011-11-11: qty 2.4

## 2011-11-11 MED ORDER — SODIUM CHLORIDE 0.9 % IV SOLN
100.0000 mL/h | INTRAVENOUS | Status: DC
  Administered 2011-11-11 – 2011-11-13 (×5): 100 mL/h via INTRAVENOUS
  Filled 2011-11-11 (×11): qty 1000

## 2011-11-11 MED ORDER — ACETAMINOPHEN 650 MG RE SUPP: 650.0000 mg | Freq: Four times a day (QID) | RECTAL | Status: DC | PRN

## 2011-11-11 MED ORDER — NEOSTIGMINE METHYLSULFATE 1 MG/ML IJ SOLN
INTRAMUSCULAR | Status: DC | PRN
  Administered 2011-11-11: 1 mg via INTRAVENOUS

## 2011-11-11 MED ORDER — PHENOL 1.4 % MT LIQD: 1.0000 | OROMUCOSAL | Status: DC | PRN

## 2011-11-11 MED ORDER — CEFAZOLIN SODIUM 1-5 GM-% IV SOLN
1.0000 g | INTRAVENOUS | Status: AC
  Administered 2011-11-11: 1 g via INTRAVENOUS

## 2011-11-11 MED ORDER — PHENYLEPHRINE HCL 10 MG/ML IJ SOLN
INTRAMUSCULAR | Status: DC | PRN
  Administered 2011-11-11 (×4): 20 ug via INTRAVENOUS

## 2011-11-11 MED ORDER — DOCUSATE SODIUM 100 MG PO CAPS
100.0000 mg | ORAL_CAPSULE | Freq: Two times a day (BID) | ORAL | Status: DC
  Administered 2011-11-11 – 2011-11-13 (×4): 100 mg via ORAL
  Filled 2011-11-11 (×7): qty 1

## 2011-11-11 MED ORDER — HYDROMORPHONE HCL PF 1 MG/ML IJ SOLN
INTRAMUSCULAR | Status: DC | PRN
  Administered 2011-11-11 (×2): 1 mg via INTRAVENOUS

## 2011-11-11 MED ORDER — ALUM & MAG HYDROXIDE-SIMETH 200-200-20 MG/5ML PO SUSP: 30.0000 mL | ORAL | Status: DC | PRN

## 2011-11-11 MED ORDER — RIFAMPIN 300 MG PO CAPS
300.0000 mg | ORAL_CAPSULE | Freq: Two times a day (BID) | ORAL | Status: DC
  Administered 2011-11-11 – 2011-11-12 (×2): 300 mg via ORAL
  Filled 2011-11-11 (×5): qty 1

## 2011-11-11 MED ORDER — VANCOMYCIN HCL 1000 MG IV SOLR
INTRAVENOUS | Status: AC
  Filled 2011-11-11: qty 1000

## 2011-11-11 MED ORDER — CISATRACURIUM BESYLATE 2 MG/ML IV SOLN
INTRAVENOUS | Status: DC | PRN
  Administered 2011-11-11 (×2): 2 mg via INTRAVENOUS
  Administered 2011-11-11: 8 mg via INTRAVENOUS

## 2011-11-11 MED ORDER — LIDOCAINE HCL (CARDIAC) 20 MG/ML IV SOLN
INTRAVENOUS | Status: DC | PRN
  Administered 2011-11-11: 80 mg via INTRAVENOUS

## 2011-11-11 MED ORDER — LACTATED RINGERS IV SOLN: INTRAVENOUS | Status: DC

## 2011-11-11 MED ORDER — VANCOMYCIN HCL 1000 MG IV SOLR
INTRAVENOUS | Status: DC | PRN
  Administered 2011-11-11: 2 g

## 2011-11-11 MED ORDER — MIDAZOLAM HCL 5 MG/5ML IJ SOLN
INTRAMUSCULAR | Status: DC | PRN
  Administered 2011-11-11: .5 mg via INTRAVENOUS
  Administered 2011-11-11: 1 mg via INTRAVENOUS

## 2011-11-11 SURGICAL SUPPLY — 60 items
BAG SPEC THK2 15X12 ZIP CLS (MISCELLANEOUS) ×1
BAG ZIPLOCK 12X15 (MISCELLANEOUS) ×2 IMPLANT
BIT DRILL 2.8X128 (BIT) ×1 IMPLANT
CLOTH BEACON ORANGE TIMEOUT ST (SAFETY) ×2 IMPLANT
CONT SPECI 4OZ STER CLIK (MISCELLANEOUS) ×2 IMPLANT
DRAPE INCISE IOBAN 85X60 (DRAPES) ×2 IMPLANT
DRAPE ORTHO SPLIT 77X108 STRL (DRAPES) ×4
DRAPE POUCH INSTRU U-SHP 10X18 (DRAPES) ×2 IMPLANT
DRAPE SURG 17X11 SM STRL (DRAPES) ×3 IMPLANT
DRAPE SURG ORHT 6 SPLT 77X108 (DRAPES) ×2 IMPLANT
DRAPE U-SHAPE 47X51 STRL (DRAPES) ×2 IMPLANT
DRAPE WARM FLUID 44X44 (DRAPE) ×2 IMPLANT
DRESSING ALLEVYN BORDER HEEL (GAUZE/BANDAGES/DRESSINGS) ×1 IMPLANT
DRESSING XEROFORM 5X9 (GAUZE/BANDAGES/DRESSINGS) ×1 IMPLANT
DRSG EMULSION OIL 3X16 NADH (GAUZE/BANDAGES/DRESSINGS) IMPLANT
DRSG MEPITEL 3X4 ME34 (GAUZE/BANDAGES/DRESSINGS) ×1 IMPLANT
DRSG PAD ABDOMINAL 8X10 ST (GAUZE/BANDAGES/DRESSINGS) IMPLANT
DURAPREP 26ML APPLICATOR (WOUND CARE) ×2 IMPLANT
ELECT BLADE TIP CTD 4 INCH (ELECTRODE) ×2 IMPLANT
ELECT REM PT RETURN 9FT ADLT (ELECTROSURGICAL) ×2
ELECTRODE REM PT RTRN 9FT ADLT (ELECTROSURGICAL) ×1 IMPLANT
EVACUATOR 1/8 PVC DRAIN (DRAIN) ×2 IMPLANT
FACESHIELD LNG OPTICON STERILE (SAFETY) ×8 IMPLANT
FLOSEAL (HEMOSTASIS) ×1 IMPLANT
GLOVE BIOGEL PI IND STRL 7.5 (GLOVE) ×1 IMPLANT
GLOVE BIOGEL PI IND STRL 8 (GLOVE) ×1 IMPLANT
GLOVE BIOGEL PI INDICATOR 7.5 (GLOVE) ×1
GLOVE BIOGEL PI INDICATOR 8 (GLOVE) ×1
GLOVE ECLIPSE 8.0 STRL XLNG CF (GLOVE) IMPLANT
GLOVE ORTHO TXT STRL SZ7.5 (GLOVE) ×2 IMPLANT
GLOVE SURG ORTHO 8.0 STRL STRW (GLOVE) ×2 IMPLANT
GOWN STRL NON-REIN LRG LVL3 (GOWN DISPOSABLE) ×2 IMPLANT
HEAD CERAMIC BIOLOX 36 +3 (Hips) ×1 IMPLANT
HEAD FEM DLT TS CER 36X+0 (Hips) IMPLANT
IMMOBILIZER KNEE 20 (SOFTGOODS) ×2
IMMOBILIZER KNEE 20 THIGH 36 (SOFTGOODS) IMPLANT
KIT BASIN OR (CUSTOM PROCEDURE TRAY) ×2 IMPLANT
KIT STIMULAN RAPID CURE  10CC (Orthopedic Implant) ×3 IMPLANT
KIT STIMULAN RAPID CURE 10CC (Orthopedic Implant) IMPLANT
LINER ACET ZIMMER (Orthopedic Implant) ×1 IMPLANT
MANIFOLD NEPTUNE II (INSTRUMENTS) ×2 IMPLANT
NS IRRIG 1000ML POUR BTL (IV SOLUTION) ×4 IMPLANT
PACK TOTAL JOINT (CUSTOM PROCEDURE TRAY) ×2 IMPLANT
POSITIONER SURGICAL ARM (MISCELLANEOUS) ×2 IMPLANT
SPONGE GAUZE 4X4 12PLY (GAUZE/BANDAGES/DRESSINGS) IMPLANT
SPONGE LAP 18X18 X RAY DECT (DISPOSABLE) ×2 IMPLANT
SPONGE LAP 4X18 X RAY DECT (DISPOSABLE) ×1 IMPLANT
STEM FEM MOD STD 36 11X16X150 (Hips) ×1 IMPLANT
STRIP CLOSURE SKIN 1/2X4 (GAUZE/BANDAGES/DRESSINGS) ×8 IMPLANT
SUCTION FRAZIER TIP 10 FR DISP (SUCTIONS) ×2 IMPLANT
SUT ETHILON 3 0 PS 1 (SUTURE) ×1 IMPLANT
SUT MNCRL AB 4-0 PS2 18 (SUTURE) ×2 IMPLANT
SUT VIC AB 1 CT1 36 (SUTURE) ×4 IMPLANT
SUT VIC AB 2-0 CT1 27 (SUTURE) ×4
SUT VIC AB 2-0 CT1 TAPERPNT 27 (SUTURE) ×2 IMPLANT
SWAB COLLECTION DEVICE MRSA (MISCELLANEOUS) ×2 IMPLANT
TOWEL OR 17X26 10 PK STRL BLUE (TOWEL DISPOSABLE) ×4 IMPLANT
TRAY FOLEY CATH 14FRSI W/METER (CATHETERS) ×2 IMPLANT
TUBE ANAEROBIC SPECIMEN COL (MISCELLANEOUS) ×2 IMPLANT
WATER STERILE IRR 1500ML POUR (IV SOLUTION) ×2 IMPLANT

## 2011-11-11 NOTE — Interval H&P Note (Signed)
History and Physical Interval Note:  11/11/2011 8:55 AM  [name]  has presented today for surgery, with the diagnosis of Infected Left Total Hip  The various methods of treatment have been discussed with the patient and family. After consideration of risks, benefits and other options for treatment, the patient has consented to  Procedure(s):  IRRIGATION AND DEBRIDEMENT LEFT  HIP WITH POLY EXCHANGE as a surgical intervention .  The patients' history has been reviewed, patient examined, no change in status, stable for surgery.  I have reviewed the patients' chart and labs.  Questions were answered to the patient's satisfaction.     [name]

## 2011-11-11 NOTE — Anesthesia Postprocedure Evaluation (Signed)
Anesthesia Post Note  Patient: [name]  Procedure(s) Performed:  IRRIGATION AND DEBRIDEMENT HIP WITH POLY EXCHANGE - Irrigation and Debridement of Infected Left Total Hip with Exchange Component  Anesthesia type: General  Patient location: PACU  Post pain: Pain level controlled  Post assessment: Post-op Vital signs reviewed  Last Vitals:  Filed Vitals:   11/11/11 1400  BP: 105/55  Pulse: 78  Temp:   Resp: 13    Post vital signs: Reviewed  Level of consciousness: sedated  Complications: No apparent anesthesia complications

## 2011-11-11 NOTE — Preoperative (Signed)
Beta Blockers   Reason not to administer Beta Blockers:Not Applicable 

## 2011-11-11 NOTE — Op Note (Signed)
[name], SHETLEY NO.:  [id number]  MEDICAL RECORD NO.:  [id number]  LOCATION:  1616                         FACILITY:  [facility]  PHYSICIAN:  Madlyn Frankel. Charlann Boxer, M.D.  DATE OF BIRTH:  Jun 21, 1941  DATE OF PROCEDURE: DATE OF DISCHARGE:                              OPERATIVE REPORT   PREOPERATIVE DIAGNOSIS:  Left total hip replacement infection.  POSTOPERATIVE DIAGNOSIS:  Left total hip replacement infection.  PROCEDURE: 1. Sharp incisional debridement and irrigation of left hip. 2. Exchange of her modular components including the femoral S-ROM stem     16 x 11 with a 36 +3 delta ceramic ball, and a new Zimmer 54 x 36     +3.5 neutral liner. 3. Placement of Stimulan antibiotic loaded or impregnated calcium     beads. 4. Removal of deep implant, it was a screw.  SURGEON:  Madlyn Frankel. Charlann Boxer, MD  ASSISTANT:  Lanney Gins, PA  Please note that Lanney Gins was present for the entirety of the case from preoperative position, perioperative retractor management, general facilitation of the case as well as primary wound closure.  ANESTHESIA:  General.  SPECIMENS:  The patient was noted to have a large swelling and effusion of the left hip and culture swabs were taken in addition to an aspiration of this fluid and sent for cell count, Gram stain, and culture.  DRAINS:  1 medium Hemovac.  BLOOD LOSS:  Probably up to 700 or 800.  FINDINGS:  The patient was noted to have a very hypertrophic and hyperemic synovium with a cobblestone appearance to it.  The fluid had a cloudy appearance to it, did not appear to be excessively purulent.  It had the appearance of a strep infection versus a staph infection.  INDICATIONS FOR PROCEDURE:  [name] is a very pleasant 70 year old female with a history of total hip replacement, requiring revision surgery and subsequent revision.  After a last revision surgery, she had been doing well until a recent visit to a dentist,  and presented to the office with some increasing left hip pain and swelling.  She was sent for aspiration revealing 18,000 white blood cells, the cultures never grew out anything.  She was placed on some oral antibiotics to get through the holiday time, but had progressive redness around the left hip with pain.  She was scheduled for I and D of the left hip with preservation of the femoral and acetabular components based on her bone stock.  The idea and our concerns and the logic behind this process were discussed at length, knowing full well that this may not work, we have a 60% to 70% chance of trying to cure this, I would want to preserve her components based on her underlying bone quality in previous revision surgeries.  Consent was obtained for benefit of and management of fracture healing.  PROCEDURE IN DETAIL:  The patient was brought to the operative theater. Once adequate anesthesia, preoperative antibiotics, which included both Ancef and vancomycin were administered.  She was positioned into the right lateral decubitus position with left side up.  Please note the antibiotics were held until the fascia incision was opened and cultures  obtained.  A time-out was performed, identifying the patient, planned procedure, and extremity.  Her left hip was prepped and draped in a sterile fashion.  Her old incision was identified.  She did have marked erythema around this area.  Then, skin incision was made followed by a soft tissue plane creation.  She was noted to have fullness on the lateral side of her hip.  Once I incised the fascia, this cloudy synovial fluid was encountered and cultures obtained both the swabs in addition to aspiration.  At this point, exposure of the hip was carried out.  She was noted to have a very hyperemic synovium that almost had a cobblestone-type appearance.  Extensive time at debridement was carried out at this point.  Once the incision and  debridement was carried out of as much tissue as I could get out given the fairly weak and fragile nature to it, the hip was dislocated.  I removed the femoral head and then removed the S-ROM component fairly easily with a known technique.  At this point, the femur was retracted anteriorly and once I debrided the acetabular area, we had the Zimmer tantalum cup put in place, I was able to remove the acetabular liner without difficulty.  Please note that, as I was moving this, we were pulling on the polyethylene pretty good and there was no evidence of the cup being loose.  The polyethylene was removed without difficulty or damage to the locking ring mechanism.  Following further debridement around the acetabulum, we irrigated the hip first at this point with 3 L of normal saline solution with pulse lavage.  I then irrigated the canal with a canal brush irrigator with about 2 L of normal saline solution and completed irrigation with a total of 9 L of normal saline solution with pulse lavage.  Once I felt that the hip had been adequately debrided, as well as irrigated out.  We prepared the Stimulan calcium beads, 20 cc was mixed with 1 g of vancomycin and 1.2 g of tobramycin and formed into beads and other 10 cc was mixed with 1 g of vancomycin and 1.2 g of tobramycin.  The paste form of this last 10 cc was used to place on the femoral stem as well as into the cup.  Once the cup had been irrigated, this paste was smeared into the cup penetrating the remaining holes to provide some antibiotic dilution in this area.  The new 36 x 54 neutral liner was then placed and impacted with the locking ring mobile again indicating that it was well seated.  I did also rub some of this prepared calcium Stimulan material around the new stem and impacted it with approximately 30 degrees of anteversion, which was about 10-15 more than her S-ROM sleeve, which was stable and probably 30 degrees of neutral.   I did do a trial reduction with __________ the range of motion was stable.  I ended up choosing a 32 +3 ball, which is 30 mm more than what we had done before to enhance stability based on debridement of soft tissues.  At this point, the beads had been prepared on the back table.  I put approximately 3/4th of beads into the joint surface.  I placed a medium Hemovac drain and reapproximated the iliotibial band and gluteal fascia using a #1 PDS suture.  The remaining quarter of the beads were then placed in the subcutaneous tissue with the subcu layer closed with 2-0 Vicryl and then  2-0 nylon for skin closure.  I chose the monofilament skin closure based on the fragility of her incision mainly in the midportion of it and also due to the cellulitic response in this area. Once this was done, the hip was cleaned, dried, and dressed sterilely and dressed with Xeroform and Mepilex dressing.  The drain site was dressed separately.  She was then brought to recovery room, extubated in stable condition, tolerating the procedure thus far well.  I will see her back in the office in followup.  We will get infectious disease consult.  She had a PICC line in the hospital and she is aware of this plan.     Madlyn Frankel Charlann Boxer, M.D.     MDO/MEDQ  D:  11/11/2011  T:  11/11/2011  Job:  [id number]

## 2011-11-11 NOTE — Progress Notes (Addendum)
Patient feeling that she is urinating while having a catheter in place.  Noted urine flowing on pad. Deflated balloon and replaced fluid after moving catheter tubing further inside bladder. Patient stated that the tubing felt better, noted urine flowing in catheter; foley catheter working for a bit, then urine started coming out around tubing.  Notified ortho on call MD - Diane, PA, replace catheter order given.

## 2011-11-11 NOTE — Anesthesia Preprocedure Evaluation (Signed)
Anesthesia Evaluation  Patient identified by MRN, date of birth, ID band Patient awake    Reviewed: Allergy & Precautions, H&P , NPO status , Patient's Chart, lab work & pertinent test results  Airway Mallampati: II TM Distance: >3 FB     Dental  (+) Teeth Intact and Dental Advisory Given,    Pulmonary neg pulmonary ROS,  clear to auscultation  Pulmonary exam normal       Cardiovascular neg cardio ROS + Valvular Problems/Murmurs Regular Normal    Neuro/Psych Negative Neurological ROS  Negative Psych ROS   GI/Hepatic negative GI ROS, Neg liver ROS,   Endo/Other  Negative Endocrine ROS  Renal/GU negative Renal ROS  Genitourinary negative   Musculoskeletal negative musculoskeletal ROS (+)   Abdominal Normal abdominal exam  (+)   Peds negative pediatric ROS (+)  Hematology negative hematology ROS (+)   Anesthesia Other Findings   Reproductive/Obstetrics negative OB ROS                           Anesthesia Physical Anesthesia Plan  ASA: II  Anesthesia Plan: General   Post-op Pain Management:    Induction: Intravenous  Airway Management Planned: Oral ETT  Additional Equipment:   Intra-op Plan:   Post-operative Plan: Extubation in OR  Informed Consent: I have reviewed the patients History and Physical, chart, labs and discussed the procedure including the risks, benefits and alternatives for the proposed anesthesia with the patient or authorized representative who has indicated his/her understanding and acceptance.   Dental advisory given  Plan Discussed with: CRNA  Anesthesia Plan Comments:         Anesthesia Quick Evaluation

## 2011-11-11 NOTE — Interval H&P Note (Signed)
History and Physical Interval Note:  11/11/2011 8:56 AM  [name]  has presented today for surgery, with the diagnosis of Infected Left Total Hip  The various methods of treatment have been discussed with the patient and family. After consideration of risks, benefits and other options for treatment, the patient has consented to  Procedure(s): IRRIGATION AND DEBRIDEMENT LEFT HIP WITH POLY EXCHANGE as a surgical intervention .  The patients' history has been reviewed, patient examined, no change in status, stable for surgery.  I have reviewed the patients' chart and labs.  Questions were answered to the patient's satisfaction.     [name]

## 2011-11-11 NOTE — H&P (View-Only) (Signed)
H&P dictated 11/02/11 Dictation # [id number]

## 2011-11-11 NOTE — Anesthesia Postprocedure Evaluation (Signed)
Anesthesia Post Note  Patient: [name]  Procedure(s) Performed:  IRRIGATION AND DEBRIDEMENT HIP WITH POLY EXCHANGE - Irrigation and Debridement of Infected Left Total Hip with Exchange Component  Anesthesia type: General  Patient location: PACU  Post pain: Pain level controlled  Post assessment: Post-op Vital signs reviewed  Last Vitals:  Filed Vitals:   11/11/11 1316  BP:   Pulse:   Temp: 36.7 C  Resp:     Post vital signs: Reviewed  Level of consciousness: sedated  Complications: No apparent anesthesia complications

## 2011-11-11 NOTE — Transfer of Care (Signed)
Immediate Anesthesia Transfer of Care Note  Patient: [name]  Procedure(s) Performed:  IRRIGATION AND DEBRIDEMENT HIP WITH POLY EXCHANGE - Irrigation and Debridement of Infected Left Total Hip with Exchange Component  Patient Location: PACU  Anesthesia Type: General  Level of Consciousness: awake, oriented, sedated, patient cooperative, lethargic and responds to stimulation  Airway & Oxygen Therapy: Patient Spontanous Breathing and Patient connected to face mask oxygen  Post-op Assessment: Report given to PACU RN, Post -op Vital signs reviewed and stable and Patient moving all extremities  Post vital signs: Reviewed and stable  Complications: No apparent anesthesia complications

## 2011-11-11 NOTE — Interval H&P Note (Signed)
History and Physical Interval Note:  11/11/2011 8:56 AM  [name]  has presented today for surgery, with the diagnosis of Infected Left Total Hip  The various methods of treatment have been discussed with the patient and family. After consideration of risks, benefits and other options for treatment, the patient has consented to  Procedure(s): IRRIGATION AND DEBRIDEMENT left HIP WITH POLY EXCHANGE as a surgical intervention .  The patients' history has been reviewed, patient examined, no change in status, stable for surgery.  I have reviewed the patients' chart and labs.  Questions were answered to the patient's satisfaction.     [name]

## 2011-11-11 NOTE — Brief Op Note (Signed)
11/11/2011  1:12 PM  PATIENT:  [name]  70 y.o. female  PRE-OPERATIVE DIAGNOSIS:  Infected Left Total Hip  POST-OPERATIVE DIAGNOSIS:  Infected Left Total Hip  PROCEDURE:  Procedure(s): IRRIGATION AND DEBRIDEMENT HIP WITH POLY EXCHANGE, placement of antibiotic beads (Stimulan)  SURGEON:  Surgeon(s): [name]  PHYSICIAN ASSISTANT: Lanney Gins, PA-C  ANESTHESIA:   general  EBL:  Total I/O In: 2000 [I.V.:2000] Out: 675 [Urine:125; Blood:550]  BLOOD ADMINISTERED:none  DRAINS: (1 medium) Hemovact drain(s) in the left hip with  Suction Open   LOCAL MEDICATIONS USED:  NONE  SPECIMEN:  Source of Specimen:  left hip fluid  DISPOSITION OF SPECIMEN:  microbiology  COUNTS:  YES  TOURNIQUET:  * No tourniquets in log *  DICTATION: .Other Dictation: Dictation Number [id number]  PLAN OF CARE: Admit to inpatient   PATIENT DISPOSITION:  PACU - hemodynamically stable.   Delay start of Pharmacological VTE agent (>24hrs) due to surgical blood loss or risk of bleeding:  {YES/NO/NOT APPLICABLE:20182

## 2011-11-12 ENCOUNTER — Encounter (HOSPITAL_COMMUNITY): Payer: Self-pay

## 2011-11-12 DIAGNOSIS — Y849 Medical procedure, unspecified as the cause of abnormal reaction of the patient, or of later complication, without mention of misadventure at the time of the procedure: Secondary | ICD-10-CM

## 2011-11-12 DIAGNOSIS — T847XXA Infection and inflammatory reaction due to other internal orthopedic prosthetic devices, implants and grafts, initial encounter: Secondary | ICD-10-CM

## 2011-11-12 LAB — BASIC METABOLIC PANEL
CO2: 25 mEq/L (ref 19–32)
Calcium: 8.9 mg/dL (ref 8.4–10.5)
Creatinine, Ser: 0.45 mg/dL — ABNORMAL LOW (ref 0.50–1.10)
Glucose, Bld: 120 mg/dL — ABNORMAL HIGH (ref 70–99)

## 2011-11-12 LAB — CBC
Hemoglobin: 8.2 g/dL — ABNORMAL LOW (ref 12.0–15.0)
MCH: 29.9 pg (ref 26.0–34.0)
MCV: 87.6 fL (ref 78.0–100.0)
RBC: 2.74 MIL/uL — ABNORMAL LOW (ref 3.87–5.11)

## 2011-11-12 MED ORDER — SODIUM CHLORIDE 0.9 % IJ SOLN
10.0000 mL | INTRAMUSCULAR | Status: DC | PRN
  Administered 2011-11-13: 10 mL

## 2011-11-12 MED ORDER — VANCOMYCIN HCL IN DEXTROSE 1-5 GM/200ML-% IV SOLN
1000.0000 mg | INTRAVENOUS | Status: DC
  Administered 2011-11-12: 1000 mg via INTRAVENOUS
  Filled 2011-11-12: qty 200

## 2011-11-12 MED ORDER — RIFAMPIN 300 MG PO CAPS
600.0000 mg | ORAL_CAPSULE | Freq: Two times a day (BID) | ORAL | Status: DC
  Administered 2011-11-12 – 2011-11-13 (×2): 600 mg via ORAL
  Filled 2011-11-12 (×6): qty 2

## 2011-11-12 MED ORDER — VANCOMYCIN HCL IN DEXTROSE 1-5 GM/200ML-% IV SOLN: 1000.0000 mg | Freq: Two times a day (BID) | INTRAVENOUS | Status: DC

## 2011-11-12 MED ORDER — CIPROFLOXACIN HCL 500 MG PO TABS
500.0000 mg | ORAL_TABLET | Freq: Two times a day (BID) | ORAL | Status: DC
  Administered 2011-11-12 – 2011-11-13 (×2): 500 mg via ORAL
  Filled 2011-11-12 (×6): qty 1

## 2011-11-12 NOTE — Progress Notes (Signed)
Subjective: 1 Day Post-Op Procedure(s) (LRB): IRRIGATION AND DEBRIDEMENT HIP WITH POLY EXCHANGE (Left)   Patient reports pain as mild. No events.  Objective:   VITALS:   Filed Vitals:   11/12/11 0540  BP: 100/58  Pulse: 91  Temp: 97.5 F (36.4 C)  Resp: 12    Neurovascular intact Dorsiflexion/Plantar flexion intact Incision: dressing C/D/I and no drainage Compartment soft  LABS  Basename 11/12/11 0545  HGB 8.2*  HCT 24.0*  WBC 3.1*  PLT 187     Basename 11/12/11 0545  NA 135  K 3.9  BUN 9  CREATININE 0.45*  GLUCOSE 120*     Assessment/Plan: 1 Day Post-Op Procedure(s) (LRB): IRRIGATION AND DEBRIDEMENT HIP WITH POLY EXCHANGE (Left)  Foley d/c'ed HV drain d/c'ed Advance diet Up with therapy Plan for discharge tomorrow to home with home health.   Anastasio Auerbach Blanca Thornton   PAC  11/12/2011, 8:35 AM

## 2011-11-12 NOTE — Progress Notes (Signed)
PT/OT/ST Cancellation Note  ___Treatment cancelled today due to medical issues with patient which prohibited therapy  __x_ Treatment cancelled this a.m. due to patient receiving procedure or test. Pt having PICC line placed. Will check back after procedure and x-ray to complete PT evaluation. Thanks.  ___ Treatment cancelled today due to patient's refusal to participate   ___ Treatment cancelled today due to   Signature: Carmina Miller, PT [phone]

## 2011-11-12 NOTE — Progress Notes (Signed)
Physical Therapy Treatment Patient Details Name: [name] MRN: [id number] DOB: 02/12/1941 Today's Date: 11/12/2011 Time: 7829-5621  1G PT Assessment/Plan  PT - Assessment/Plan Comments on Treatment Session: Pt continues to perform well with no c/o pain. Pt very aware of hip precautions-declined pillow between legs while sitting in recliner. Encouraged pt to continue to have assist when mobilizing in room.  PT Plan: Discharge plan remains appropriate PT Frequency: 7X/week Follow Up Recommendations: Home health PT Equipment Recommended: None recommended by PT PT Goals  Acute Rehab PT Goals PT Goal Formulation: With patient Time For Goal Achievement: 7 days Pt will go Supine/Side to Sit: with supervision PT Goal: Supine/Side to Sit - Progress: Not met Pt will go Sit to Supine/Side: with supervision PT Goal: Sit to Supine/Side - Progress: Not met Pt will go Sit to Stand: with modified independence PT Goal: Sit to Stand - Progress: Revised due to lack of progress Pt will Ambulate: 51 - 150 feet;with modified independence PT Goal: Ambulate - Progress: Revised (modified due to lack of progress/goal met) Pt will Go Up / Down Stairs: 1-2 stairs (1 step) PT Goal: Up/Down Stairs - Progress: Not met Pt will Perform Home Exercise Program: with supervision, verbal cues required/provided PT Goal: Perform Home Exercise Program - Progress: Not met  PT Treatment Precautions/Restrictions  Precautions Precautions: Posterior Hip Precaution Booklet Issued: No Precaution Comments: Pt able to recall 3/3 hip precautions. Declined hip precation handout-pt has had 3 previous surgeries on left hip. Required Braces or Orthoses: No Restrictions Weight Bearing Restrictions: Yes LLE Weight Bearing: Partial weight bearing LLE Partial Weight Bearing Percentage or Pounds: 50% Mobility (including Balance) Transfers: Yes Sit to Stand: 5: Supervision Sit to Stand Details (indicate cue type and reason):  VCs safety. Stand to Sit: 5: Supervision Stand to Sit Details: VCs safety Ambulation/Gait Ambulation/Gait: Yes Ambulation/Gait Assistance: 5: Supervision Ambulation/Gait Assistance Details (indicate cue type and reason): VCs safety, technique. Encouraged increased WBing on L LE-pt utilizing NWB pattern initially during ambulation. Ambulation Distance (Feet): 125 Feet Assistive device: Rolling walker Gait Pattern: Step-to pattern;Decreased stance time - left;Decreased weight shift to left  Exercise    End of Session PT - End of Session Equipment Utilized During Treatment: Gait belt Activity Tolerance: Patient tolerated treatment well Patient left: in chair;with call bell in reach General Behavior During Session: [facility] for tasks performed Cognition: [facility] for tasks performed  Rebeca Alert [facility] 11/12/2011, 3:38 PM [phone]

## 2011-11-12 NOTE — Consult Note (Signed)
Infectious Diseases Note:      [name] is 70 year  Old woman with complicated history of left hip joint prosthesis. 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 She had revision in the past year of left hip and did well until about three weeks ago when she developed redness and swelling at incision site. Dr. Charlann Boxer tapped hip at that time and fluid was cloudy with about 18K WBCs which 89% were PMNs. Gram stain and culture were negative despite not having been on any recent antibiotics. She had had dental work in recent weeks and this is one possible explanation but in no way provable or likely based on bacteria usually seen in joint infections that do not reflect mouth origin. She has had no bouts of cellulitis nor any UTIs. She has now undergone po;y exchange per Dr. Charlann Boxer and subsequent cultures are no growth.  BP 110/73  Pulse 97  Temp(Src) 97.8 F (36.6 C) (Oral)  Resp 14  Ht 5' 0.75" (1.543 m)  Wt 105 lb (47.628 kg)  BMI 20.00 kg/m2  SpO2 98%  Brief Exam: Comfortable, fully alert and oriented. Wound dressed but no obvious drainage.   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 Thus recommend 4-6 weeks of oral and IV antibiotics and would use vancomycin+rifampin as planned to cover staph and strep. Also would add oral ciprofloxacin 500mg  PO BID for 4 weeks and then oral regiment of cipo plus doxycycline 100mg  BID for another 6 weeks for total of 10 weeks. She had rash with recent Septra so will not expose further.    She will call my office for f/u visit in mid January. Thank you, Lina Sayre

## 2011-11-12 NOTE — Progress Notes (Signed)
OT Screen:  Pt was screened for OT services.  No acute OT needs were identified. She has had several hip surgeries and has assistance as well as DME.   Will sign off. [place], [place] [phone] 11/12/2011

## 2011-11-12 NOTE — Progress Notes (Signed)
ANTIBIOTIC CONSULT NOTE - INITIAL  Pharmacy Consult for Vanco Indication: Left prosthetic hip infection  Allergies  Allergen Reactions  . Bactrim Rash    Patient Measurements: Height: 5' 0.75" (154.3 cm) Weight: 105 lb (47.628 kg) IBW/kg (Calculated) : 47.23   Vital Signs: Temp: 97.8 F (36.6 C) (12/29 1425) Temp src: Oral (12/29 1425) BP: 110/73 mmHg (12/29 1425) Pulse Rate: 97  (12/29 1425) Intake/Output from previous day: 12/28 0701 - 12/29 0700 In: 3000 [P.O.:120; I.V.:2800] Out: 3695 [Urine:3050; Drains:95; Blood:550] Intake/Output from this shift: Total I/O In: 120 [P.O.:120] Out: 100 [Urine:100]  Labs:  Basename 11/12/11 0545  WBC 3.1*  HGB 8.2*  PLT 187  LABCREA --  CREATININE 0.45*   Estimated Creatinine Clearance: 48.8 ml/min (by C-G formula based on Cr of 0.45). No results found for this basename: VANCOTROUGH:2,VANCOPEAK:2,VANCORANDOM:2,GENTTROUGH:2,GENTPEAK:2,GENTRANDOM:2,TOBRATROUGH:2,TOBRAPEAK:2,TOBRARND:2,AMIKACINPEAK:2,AMIKACINTROU:2,AMIKACIN:2, in the last 72 hours   Microbiology: Recent Results (from the past 720 hour(s))  ANAEROBIC CULTURE     Status: Normal   Collection Time   10/28/11 12:00 AM      Component Value Range Status Comment   GRAM STAIN WBC present-both PMN and Mononuclear   Final    GRAM STAIN No Organisms Seen   Final    Organism ID, Bacteria NO ANAEROBES ISOLATED   Final   BODY FLUID CULTURE     Status: Normal   Collection Time   10/28/11 12:00 AM      Component Value Range Status Comment   GRAM STAIN WBC present-both PMN and Mononuclear   Final    GRAM STAIN No Organisms Seen   Final    Organism ID, Bacteria NO GROWTH 3 DAYS   Final   MRSA CULTURE     Status: Normal   Collection Time   11/04/11 10:30 AM      Component Value Range Status Comment   Specimen Description NOSE   Final    Special Requests NONE   Final    Culture NO GROWTH 2 DAYS   Final    Report Status 11/07/2011 FINAL   Final   SURGICAL PCR SCREEN      Status: Abnormal   Collection Time   11/04/11 11:09 AM      Component Value Range Status Comment   MRSA, PCR INVALID RESULTS, SPECIMEN SENT FOR CULTURE (*) NEGATIVE  Final    Staphylococcus aureus INVALID RESULTS, SPECIMEN SENT FOR CULTURE (*) NEGATIVE  Final   GRAM STAIN     Status: Normal   Collection Time   11/11/11 11:30 AM      Component Value Range Status Comment   Specimen Description SYNOVIAL LT HIP   Final    Special Requests NONE   Final    Gram Stain     Final    Value: ABUNDANT WBC PRESENT, PREDOMINANTLY PMN     NO ORGANISMS SEEN     Gram Stain Report Called to,Read Back By and Verified With: DR OLIN AT 1312 ON 12.28.12 BY SHUEA   Report Status 11/11/2011 FINAL   Final   ANAEROBIC CULTURE     Status: Normal (Preliminary result)   Collection Time   11/11/11 11:30 AM      Component Value Range Status Comment   Specimen Description SYNOVIAL LT HIP   Final    Special Requests NONE   Final    Gram Stain     Final    Value: RARE WBC PRESENT,BOTH PMN AND MONONUCLEAR     NO ORGANISMS SEEN   Culture  Final    Value: NO ANAEROBES ISOLATED; CULTURE IN PROGRESS FOR 5 DAYS   Report Status PENDING   Incomplete   BODY FLUID CULTURE     Status: Normal (Preliminary result)   Collection Time   11/11/11 11:30 AM      Component Value Range Status Comment   Specimen Description SYNOVIAL LT HIP   Final    Special Requests NONE   Final    Gram Stain     Final    Value: RARE WBC PRESENT,BOTH PMN AND MONONUCLEAR     NO ORGANISMS SEEN   Culture NO GROWTH 1 DAY   Final    Report Status PENDING   Incomplete     Medical History: Past Medical History  Diagnosis Date  . Heart murmur   . Arthritis     Medications:  Scheduled:    . ciprofloxacin  500 mg Oral BID  . docusate sodium  100 mg Oral BID  . estropipate  1.5 mg Oral Daily  . fentaNYL      . ferrous sulfate  325 mg Oral TID PC  . HYDROcodone-acetaminophen  1-2 tablet Oral Q4H  . polyethylene glycol  17 g Oral BID  .  rifampin  600 mg Oral Q12H  . rivaroxaban  10 mg Oral Q24H  . vancomycin  1,000 mg Intravenous Q12H  . vancomycin  1,000 mg Intravenous Q12H  . DISCONTD: rifampin  300 mg Oral Q12H   Infusions:    . lactated ringers    . sodium chloride 0.9 % 1,000 mL with potassium chloride 10 mEq infusion 100 mL/hr (11/12/11 1237)   PRN: acetaminophen, acetaminophen, alum & mag hydroxide-simeth, bisacodyl, diphenhydrAMINE, HYDROmorphone, menthol-cetylpyridinium, methocarbamol(ROBAXIN) IV, methocarbamol, metoCLOPramide (REGLAN) injection, metoCLOPramide, ondansetron (ZOFRAN) IV, ondansetron, phenol, sodium chloride, sodium phosphate, zolpidem  Assessment: 70 yo F to start IV vanco for left prosthetic hip infection. Last dose of Vanco 1g given 12/29 @ 0230. Plan is for patient to go home on IV vanco (in addition to other abx per ID recs).  Goal of Therapy:  Vancomycin trough level 15-20 mcg/ml  Plan:  Vanco 1g IV q24h Check Vanco trough at steady state  [name] 11/12/2011,10:09 PM

## 2011-11-13 LAB — CBC
HCT: 23.5 % — ABNORMAL LOW (ref 36.0–46.0)
MCH: 30 pg (ref 26.0–34.0)
MCV: 87 fL (ref 78.0–100.0)
Platelets: 214 10*3/uL (ref 150–400)
RBC: 2.7 MIL/uL — ABNORMAL LOW (ref 3.87–5.11)
RDW: 15 % (ref 11.5–15.5)

## 2011-11-13 LAB — BASIC METABOLIC PANEL
BUN: 8 mg/dL (ref 6–23)
CO2: 25 mEq/L (ref 19–32)
Calcium: 9.6 mg/dL (ref 8.4–10.5)
Creatinine, Ser: 0.42 mg/dL — ABNORMAL LOW (ref 0.50–1.10)

## 2011-11-13 MED ORDER — VANCOMYCIN HCL IN DEXTROSE 1-5 GM/200ML-% IV SOLN: 1000.0000 mg | Freq: Once | INTRAVENOUS | Status: DC

## 2011-11-13 MED ORDER — HYDROCODONE-ACETAMINOPHEN 5-325 MG PO TABS: 1.0000 | ORAL_TABLET | Freq: Four times a day (QID) | ORAL | Status: DC | PRN

## 2011-11-13 MED ORDER — METHOCARBAMOL 500 MG PO TABS: 500.0000 mg | ORAL_TABLET | Freq: Four times a day (QID) | ORAL | Status: DC | PRN

## 2011-11-13 MED ORDER — RIFAMPIN 300 MG PO CAPS: 600.0000 mg | ORAL_CAPSULE | Freq: Two times a day (BID) | ORAL | Status: AC

## 2011-11-13 MED ORDER — ASPIRIN EC 325 MG PO TBEC: 325.0000 mg | DELAYED_RELEASE_TABLET | Freq: Two times a day (BID) | ORAL | Status: AC

## 2011-11-13 MED ORDER — VANCOMYCIN HCL IN DEXTROSE 1-5 GM/200ML-% IV SOLN
1000.0000 mg | Freq: Once | INTRAVENOUS | Status: AC
  Administered 2011-11-13: 1000 mg via INTRAVENOUS
  Filled 2011-11-13: qty 200

## 2011-11-13 MED ORDER — RIFAMPIN 300 MG PO CAPS: 600.0000 mg | ORAL_CAPSULE | Freq: Two times a day (BID) | ORAL | Status: DC

## 2011-11-13 MED ORDER — CIPROFLOXACIN HCL 500 MG PO TABS: 500.0000 mg | ORAL_TABLET | Freq: Two times a day (BID) | ORAL | Status: AC

## 2011-11-13 MED ORDER — DIPHENHYDRAMINE HCL 25 MG PO CAPS: 25.0000 mg | ORAL_CAPSULE | Freq: Four times a day (QID) | ORAL | Status: AC | PRN

## 2011-11-13 MED ORDER — FERROUS SULFATE 325 (65 FE) MG PO TABS: 325.0000 mg | ORAL_TABLET | Freq: Three times a day (TID) | ORAL | Status: DC

## 2011-11-13 MED ORDER — POLYETHYLENE GLYCOL 3350 17 G PO PACK: 17.0000 g | PACK | Freq: Two times a day (BID) | ORAL | Status: AC

## 2011-11-13 MED ORDER — DSS 100 MG PO CAPS: 100.0000 mg | ORAL_CAPSULE | Freq: Two times a day (BID) | ORAL | Status: AC

## 2011-11-13 MED ORDER — CIPROFLOXACIN HCL 500 MG PO TABS: 500.0000 mg | ORAL_TABLET | Freq: Two times a day (BID) | ORAL | Status: DC

## 2011-11-13 MED ORDER — METHOCARBAMOL 500 MG PO TABS: 500.0000 mg | ORAL_TABLET | Freq: Four times a day (QID) | ORAL | Status: AC | PRN

## 2011-11-13 MED ORDER — VANCOMYCIN HCL IN DEXTROSE 1-5 GM/200ML-% IV SOLN: 1000.0000 mg | INTRAVENOUS | Status: DC

## 2011-11-13 MED ORDER — HYDROCODONE-ACETAMINOPHEN 5-325 MG PO TABS: 1.0000 | ORAL_TABLET | Freq: Four times a day (QID) | ORAL | Status: AC | PRN

## 2011-11-13 NOTE — Progress Notes (Signed)
Patient ID: [name], female   DOB: [birth year], 70 y.o.   MRN: [id number] Subjective: 2 Days Post-Op Procedure(s) (LRB): IRRIGATION AND DEBRIDEMENT HIP WITH POLY EXCHANGE (Left)    Patient reports pain as mild.  Objective:   VITALS:   Filed Vitals:   11/13/11 0510  BP: 118/70  Pulse: 100  Temp: 98.4 F (36.9 C)  Resp: 16    Neurovascular intact Incision: dressing C/D/I peri-incisional wound erythema diminishing  LABS  Basename 11/13/11 0500 11/12/11 0545  HGB 8.1* 8.2*  HCT 23.5* 24.0*  WBC 5.3 3.1*  PLT 214 187     Basename 11/13/11 0500 11/12/11 0545  NA 131* 135  K 3.8 3.9  BUN 8 9  CREATININE 0.42* 0.45*  GLUCOSE 98 120*    No results found for this basename: LABPT:2,INR:2 in the last 72 hours   Assessment/Plan: 2 Days Post-Op Procedure(s) (LRB): IRRIGATION AND DEBRIDEMENT HIP WITH POLY EXCHANGE (Left)   Up with therapy Discharge home with home health

## 2011-11-13 NOTE — Discharge Summary (Signed)
Physician Discharge Summary  Patient ID: [name] MRN: [id number] DOB/AGE: 1941-01-07 70 y.o.  Admit date: 11/11/2011 Discharge date: 11/13/2011  Procedures:  Procedure(s) (LRB): IRRIGATION AND DEBRIDEMENT HIP WITH POLY EXCHANGE (Left)  Attending Physician: [name]   Admission Diagnoses: Infected left hip  Discharge Diagnoses:  Principal Problem:  *Infection of the left hip Heart murmur, which she has had for years with no problems.  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 At that time, she initially did well, but she recently has had redness and swelling in her wound and some discomfort anteriorly in the hip, and after aspiration and scanning, is noted to have infection of the hip. At this time, she is admitted to the hospital for removal of her arthroplasty, and at this time is scheduled for I and D and poly exchange. At this time, the patient is scheduled for I and D, cultures, and component exchange. The surgery, risks, benefits, and aftercare were discussed with the patient. Questions invited and answered.    PCP: Kari Baars, MD, MD   Discharged Condition: good  Hospital Course:  Patient underwent the above stated procedure on 11/11/2011. Patient tolerated the procedure well and brought to the recovery room in good condition and subsequently to the floor.  POD #1 BP: 100/58 ; Pulse: 91 ; Temp: 97.5 F  Pt's foley was removed, as well as the hemovac drain removed. IV was changed to a saline lock. Patient reports pain as mild. No events. Neurovascular intact, dorsiflexion/plantar flexion intact, incision: dressing C/D/I and no drainage and compartment soft.  LABS  Basename  11/12/11 0545   HGB  8.2*   HCT  24.0*    POD #2  BP: 118/70 ; Pulse: 100 ; Temp: 98.4 F  Patient reports pain as mild. No events. Ready to go  home. Neurovascular intact, dorsiflexion/plantar flexion intact, incision: dressing C/D/I and no drainage and compartment soft.  LABS  Basename  11/13/11 0500   HGB  8.1*   HCT  23.5*     Discharge Exam: Extremities: Homans sign is negative, no sign of DVT, no edema, redness or tenderness in the calves or thighs and no ulcers, gangrene or trophic changes  Disposition: Home-Health Care Svc with HHPT (50% WB) and HHRN for IV Vancomycin and weekly troughs.  Discharge Orders    Future Orders Please Complete By Expires   Call MD / Call 911      Comments:   If you experience chest pain or shortness of breath, CALL 911 and be transported to the hospital emergency room.  If you develope a fever above 101 F, pus (white drainage) or increased drainage or redness at the wound, or calf pain, call your surgeon's office.   Discharge instructions      Comments:   Daily dressing changes with gauze and tape. Keep the area dry and clean until follow up. Follow up in 2 weeks at [facility]. Call with any questions or concerns.     Constipation Prevention      Comments:   Drink plenty of fluids.  Prune juice may be helpful.  You may use a stool softener, such as Colace (over the counter) 100 mg twice a day.  Use MiraLax (over the counter) for constipation as needed.   Increase activity slowly as tolerated      Weight  Bearing as taught in Physical Therapy      Comments:   50% weight bearing with use of a walker as instructed.   Driving restrictions      Comments:   No driving for 4 weeks   Change dressing      Comments:   Daily dressing changes with 4x4 guaze and tape.   TED hose      Comments:   Use stockings (TED hose) for 2 weeks on both leg(s).  You may remove them at night for sleeping.      Current Discharge Medication List    START taking these medications   Details  ciprofloxacin (CIPRO) 500 MG tablet Take 1 tablet (500 mg total) by mouth 2 (two) times daily. Qty: 84  tablet, Refills: 0    diphenhydrAMINE (BENADRYL) 25 mg capsule Take 1 capsule (25 mg total) by mouth every 6 (six) hours as needed for itching, allergies or sleep.    docusate sodium 100 MG CAPS Take 100 mg by mouth 2 (two) times daily.    ferrous sulfate 325 (65 FE) MG tablet Take 1 tablet (325 mg total) by mouth 3 (three) times daily after meals.    HYDROcodone-acetaminophen (NORCO) 5-325 MG per tablet Take 1 tablet by mouth every 6 (six) hours as needed for pain. Qty: 30 tablet, Refills: 0    methocarbamol (ROBAXIN) 500 MG tablet Take 1 tablet (500 mg total) by mouth every 6 (six) hours as needed (muscle spasms). Qty: 30 tablet, Refills: 0    polyethylene glycol (MIRALAX / GLYCOLAX) packet Take 17 g by mouth 2 (two) times daily.    rifampin (RIFADIN) 300 MG capsule Take 2 capsules (600 mg total) by mouth every 12 (twelve) hours. Qty: 84 capsule, Refills: 0    vancomycin (VANCOCIN) 1 GM/200ML SOLN Inject 200 mLs (1,000 mg total) into the vein once. Qty: 4000 mL, Refills: 0   Comments: 1 gram daily. Weekly trough to be draw and monitored by Home Health RN and adjustments to be made by [facility] pharmacy.      CONTINUE these medications which have CHANGED   Details  aspirin EC 325 MG tablet Take 1 tablet (325 mg total) by mouth 2 (two) times daily. X 4 weeks Qty: 60 tablet, Refills: 0      CONTINUE these medications which have NOT CHANGED   Details  Calcium Citrate-Vitamin D (CALCIUM CITRATE + D PO) Take 1 tablet by mouth daily.     estropipate (OGEN) 1.5 MG tablet Take 1.5 mg by mouth daily.       STOP taking these medications     acetaminophen (TYLENOL) 500 MG tablet Comments:  Reason for Stopping:       cephALEXin (KEFLEX) 500 MG capsule Comments:  Reason for Stopping:       ibuprofen (ADVIL,MOTRIN) 200 MG tablet Comments:  Reason for Stopping:       Multiple Vitamin (MULITIVITAMIN WITH MINERALS) TABS Comments:  Reason for Stopping:        sulfamethoxazole-trimethoprim (BACTRIM DS) 800-160 MG per tablet Comments:  Reason for Stopping:            Signed: Anastasio Auerbach. Hazael Olveda   PAC  11/13/2011, 8:52 AM

## 2011-11-13 NOTE — Progress Notes (Signed)
Physical Therapy Treatment Patient Details Name: [name] MRN: [id number] DOB: Jul 16, 1941 Today's Date: 11/13/2011 9:06-9:25, gt  PT Assessment/Plan  PT - Assessment/Plan Comments on Treatment Session: Pt doing well.  Anticipate d/c home this PM. PT Plan: Discharge plan remains appropriate PT Frequency: 7X/week Follow Up Recommendations: Home health PT Equipment Recommended: None recommended by PT PT Goals  Acute Rehab PT Goals PT Goal: Supine/Side to Sit - Progress: Progressing toward goal PT Goal: Sit to Stand - Progress: Met PT Goal: Ambulate - Progress: Progressing toward goal PT Goal: Up/Down Stairs - Progress: Progressing toward goal PT Goal: Perform Home Exercise Program - Progress: Progressing toward goal  PT Treatment Precautions/Restrictions  Precautions Precautions: Posterior Hip Precaution Booklet Issued: No Precaution Comments: Pt able to recall 3/3 hip precautions. Declined hip precation handout-pt has had 3 previous surgeries on left hip. Required Braces or Orthoses: No Restrictions Weight Bearing Restrictions: Yes LLE Weight Bearing: Partial weight bearing LLE Partial Weight Bearing Percentage or Pounds: 50% Mobility (including Balance) Bed Mobility Supine to Sit: 6: Modified independent (Device/Increase time) Supine to Sit Details (indicate cue type and reason): good following of precautions Transfers Sit to Stand: 6: Modified independent (Device/Increase time);From bed;From toilet Stand to Sit: 6: Modified independent (Device/Increase time) Ambulation/Gait Ambulation/Gait Assistance: 6: Modified independent (Device/Increase time) Ambulation/Gait Assistance Details (indicate cue type and reason): cues to increase weight up to 50% on L LE Ambulation Distance (Feet): 100 Feet Assistive device: Rolling walker Gait Pattern: Step-to pattern Stairs: Yes Stairs Assistance: 5: Supervision Stair Management Technique: Backwards;No rails;With  walker Number of Stairs: 1     Exercise  Reviewed illustrated handout of exercises.  Pt verbalized understanding.   End of Session PT - End of Session Equipment Utilized During Treatment: Gait belt Activity Tolerance: Patient tolerated treatment well Patient left: in chair;with call bell in reach Nurse Communication: Mobility status for transfers;Mobility status for ambulation General Behavior During Session: [facility] for tasks performed Cognition: [facility] for tasks performed  [facility] LUBECK 11/13/2011, 10:35 AM

## 2011-11-13 NOTE — Progress Notes (Signed)
   CARE MANAGEMENT NOTE 11/13/2011  Patient:  [name], [name]   Account Number:  [id number]  Date Initiated:  11/13/2011  Documentation initiated by:  Tera Mater  Subjective/Objective Assessment:   Admitted to have surgical repair of infected Left Hip.  Pt. lives at home with spouse.     Action/Plan:   Will need HH RN for possible IV antibiotics and HH PT for rehab   Anticipated DC Date:  11/13/2011   Anticipated DC Plan:  HOME W HOME HEALTH SERVICES      DC Planning Services  CM consult      Choice offered to / List presented to:          [facility] arranged  HH-1 RN  HH-2 PT      [facility] agency  [facility]   Status of service:  Completed, signed off Medicare Important Message given?  NO (If response is "NO", the following Medicare IM given date fields will be blank) Date Medicare IM given:   Date Additional Medicare IM given:    Discharge Disposition:  HOME W HOME HEALTH SERVICES  Per UR Regulation:    Comments:  11/13/11 1315--CM referral received.  Prior to admission, pt. was set up with [facility] for [facility] services. TC to [place] with [facility] to make referral for [facility] RN to give IV antibiotics and HH PT.  Pt. to be discharged today.  Faxed facesheet, history and physical, discharge summary, and IV antibiotic Vancomycin rx to ([phone]).  SOC to begin tomorrow for Vancomycin dose. Spoke with pt. about discharge plan and she is agreeable. Tera Mater, RN, BSN Case Manager [phone]

## 2011-11-14 ENCOUNTER — Encounter (HOSPITAL_COMMUNITY): Payer: Self-pay | Admitting: Orthopedic Surgery

## 2011-11-14 LAB — BODY FLUID CULTURE

## 2011-11-15 HISTORY — PX: DOPPLER ECHOCARDIOGRAPHY: SHX263

## 2011-11-15 HISTORY — PX: OTHER SURGICAL HISTORY: SHX169

## 2011-11-16 LAB — ANAEROBIC CULTURE

## 2011-11-18 ENCOUNTER — Telehealth: Payer: Self-pay | Admitting: *Deleted

## 2011-11-18 NOTE — Telephone Encounter (Signed)
[name], a pharmacist from [place] called to report a vanco of 5.7. She is going to follow the protocol and change dose accordingly.

## 2011-12-01 ENCOUNTER — Inpatient Hospital Stay: Payer: Medicare Other | Admitting: Internal Medicine

## 2011-12-06 ENCOUNTER — Telehealth: Payer: Self-pay | Admitting: *Deleted

## 2011-12-06 NOTE — Telephone Encounter (Signed)
rec'd a call from [facility] with the [facility]. The pt's WBCs are trending down. States she has faxed labs here.not found. She will fax them again. I gave her Dr. Bonnetta Barry pager so she can call & discus with him. She reports pt is on other meds that we do not have listed (rifampin, cipro)

## 2011-12-06 NOTE — Telephone Encounter (Signed)
States the pharmacist told her her WBCs were dropping & that she should call us. I told her I had spoken with Mindi Junker, the pharmacist this am & gave her Dr. Bonnetta Barry pager & cell number. She was going to call him & report the trending numbers & see if there were any orders. I told pt I will check with him Thursday when he is here & make sure it has been addressed [phone]

## 2011-12-22 ENCOUNTER — Ambulatory Visit (INDEPENDENT_AMBULATORY_CARE_PROVIDER_SITE_OTHER): Payer: Medicare Other | Admitting: Infectious Diseases

## 2011-12-22 VITALS — BP 152/76 | HR 80 | Temp 97.4°F | Ht 61.0 in | Wt 111.0 lb

## 2011-12-22 DIAGNOSIS — T8149XA Infection following a procedure, other surgical site, initial encounter: Secondary | ICD-10-CM

## 2011-12-22 DIAGNOSIS — T8140XA Infection following a procedure, unspecified, initial encounter: Secondary | ICD-10-CM

## 2011-12-22 MED ORDER — DOXYCYCLINE HYCLATE 100 MG PO TABS: 100.0000 mg | ORAL_TABLET | Freq: Two times a day (BID) | ORAL | Status: AC

## 2011-12-22 MED ORDER — DOXYCYCLINE HYCLATE 100 MG PO TABS: 100.0000 mg | ORAL_TABLET | Freq: Two times a day (BID) | ORAL | Status: DC

## 2011-12-28 ENCOUNTER — Encounter: Payer: Self-pay | Admitting: Infectious Diseases

## 2011-12-28 NOTE — Progress Notes (Signed)
Subjective:    Patient ID: [name] is a 71 y.o. female.  Chief Complaint:follow up for inpatient consultation for right prosthetic hip infection and  capsule replacement in 12/12 by Lajoyce Corners, MD. Mrs Mancusi is wife of retired neurosurgeon Teesha Ohm who accompanies his wife at today's visit. Mrs. Knaak developed acute swelling about her prosthesis in late November 2 months ago and had aspiration which showed >80,000 WBCs which were predominantly PMNs and no bacteria were seen on gram stain and none were recovered on culture. Because of the presentation, Dr Charlann Boxer and I agreed to treat her 6 weeks with IV Vancomycin and oral ciprofloxacin to target most likely pathogens. Mrs Bajorek understands that 10% or more of apparent acute clinical septic arthritis are culture negative but will persist or relapse if not treated. She just completed her antibiotic regimen and she is agreeable to continue oral doxycycline 100mg  BID for 6 more weeks. I will follow her up at that point. } Data} Review of Systems  Constitutional: Negative.   HENT: Negative.   Eyes: Negative.   Respiratory: Negative.   Musculoskeletal: Negative.   Skin: Negative.     Objective:  Physical ExamBP 152/76  Pulse 80  Temp 97.4 F (36.3 C) (Oral)  Ht 5\' 1"  (1.549 m)  Wt 111 lb (50.349 kg)  BMI 20.97 kg/m2 Healed incision left hip without any sign of inflammation.  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  Plan:  6 more weeks of oral doxycycline to complete 3 month course of empiric antibiotics Lina Sayre

## 2012-02-29 ENCOUNTER — Other Ambulatory Visit: Payer: Self-pay | Admitting: Neurosurgery

## 2012-02-29 DIAGNOSIS — M549 Dorsalgia, unspecified: Secondary | ICD-10-CM

## 2012-03-01 ENCOUNTER — Ambulatory Visit
Admission: RE | Admit: 2012-03-01 | Discharge: 2012-03-01 | Disposition: A | Discharge status: Home / Self Care | Payer: Medicare Other | Source: Ambulatory Visit | Attending: Neurosurgery | Admitting: Neurosurgery

## 2012-03-01 DIAGNOSIS — M549 Dorsalgia, unspecified: Secondary | ICD-10-CM

## 2012-03-01 MED ORDER — GADOBENATE DIMEGLUMINE 529 MG/ML IV SOLN
10.0000 mL | Freq: Once | INTRAVENOUS | Status: AC | PRN
  Administered 2012-03-01: 10 mL via INTRAVENOUS

## 2012-09-24 ENCOUNTER — Encounter (HOSPITAL_COMMUNITY): Payer: Self-pay | Admitting: *Deleted

## 2012-09-25 ENCOUNTER — Encounter (HOSPITAL_COMMUNITY)
Admission: RE | Disposition: A | Discharge status: Home Health | Payer: Self-pay | Source: Ambulatory Visit | Attending: Orthopedic Surgery

## 2012-09-25 ENCOUNTER — Inpatient Hospital Stay (HOSPITAL_COMMUNITY): Payer: Medicare Other

## 2012-09-25 ENCOUNTER — Encounter (HOSPITAL_COMMUNITY): Payer: Self-pay | Admitting: *Deleted

## 2012-09-25 ENCOUNTER — Encounter (HOSPITAL_COMMUNITY): Payer: Self-pay | Admitting: Certified Registered Nurse Anesthetist

## 2012-09-25 ENCOUNTER — Inpatient Hospital Stay (HOSPITAL_COMMUNITY)
Admission: RE | Admit: 2012-09-25 | Discharge: 2012-09-27 | DRG: 464 | Disposition: A | Discharge status: Home Health | Payer: Medicare Other | Source: Ambulatory Visit | Attending: Orthopedic Surgery | Admitting: Orthopedic Surgery

## 2012-09-25 ENCOUNTER — Inpatient Hospital Stay (HOSPITAL_COMMUNITY): Payer: Medicare Other | Admitting: Certified Registered Nurse Anesthetist

## 2012-09-25 DIAGNOSIS — E871 Hypo-osmolality and hyponatremia: Secondary | ICD-10-CM | POA: Diagnosis not present

## 2012-09-25 DIAGNOSIS — M169 Osteoarthritis of hip, unspecified: Secondary | ICD-10-CM | POA: Diagnosis present

## 2012-09-25 DIAGNOSIS — T84039A Mechanical loosening of unspecified internal prosthetic joint, initial encounter: Secondary | ICD-10-CM | POA: Diagnosis present

## 2012-09-25 DIAGNOSIS — T8459XA Infection and inflammatory reaction due to other internal joint prosthesis, initial encounter: Secondary | ICD-10-CM

## 2012-09-25 DIAGNOSIS — Y831 Surgical operation with implant of artificial internal device as the cause of abnormal reaction of the patient, or of later complication, without mention of misadventure at the time of the procedure: Secondary | ICD-10-CM | POA: Diagnosis present

## 2012-09-25 DIAGNOSIS — M1610 Unilateral primary osteoarthritis, unspecified hip: Secondary | ICD-10-CM | POA: Diagnosis present

## 2012-09-25 DIAGNOSIS — D62 Acute posthemorrhagic anemia: Secondary | ICD-10-CM | POA: Diagnosis not present

## 2012-09-25 DIAGNOSIS — Z96649 Presence of unspecified artificial hip joint: Secondary | ICD-10-CM

## 2012-09-25 DIAGNOSIS — T8450XA Infection and inflammatory reaction due to unspecified internal joint prosthesis, initial encounter: Principal | ICD-10-CM | POA: Diagnosis present

## 2012-09-25 HISTORY — PX: EXCISIONAL TOTAL HIP ARTHROPLASTY WITH ANTIBIOTIC SPACERS: SHX5826

## 2012-09-25 LAB — BASIC METABOLIC PANEL
BUN: 14 mg/dL (ref 6–23)
CO2: 25 mEq/L (ref 19–32)
Calcium: 9.4 mg/dL (ref 8.4–10.5)
Creatinine, Ser: 0.47 mg/dL — ABNORMAL LOW (ref 0.50–1.10)
Glucose, Bld: 85 mg/dL (ref 70–99)

## 2012-09-25 LAB — TYPE AND SCREEN
ABO/RH(D): A POS
Antibody Screen: NEGATIVE

## 2012-09-25 LAB — PROTIME-INR: Prothrombin Time: 13 seconds (ref 11.6–15.2)

## 2012-09-25 LAB — CBC
MCH: 30.2 pg (ref 26.0–34.0)
MCV: 90 fL (ref 78.0–100.0)
Platelets: 437 10*3/uL — ABNORMAL HIGH (ref 150–400)
RBC: 3.71 MIL/uL — ABNORMAL LOW (ref 3.87–5.11)

## 2012-09-25 LAB — URINALYSIS, ROUTINE W REFLEX MICROSCOPIC
Glucose, UA: NEGATIVE mg/dL
Hgb urine dipstick: NEGATIVE
Specific Gravity, Urine: 1.008 (ref 1.005–1.030)
pH: 6.5 (ref 5.0–8.0)

## 2012-09-25 LAB — POCT I-STAT 4, (NA,K, GLUC, HGB,HCT)
HCT: 25 % — ABNORMAL LOW (ref 36.0–46.0)
Sodium: 134 mEq/L — ABNORMAL LOW (ref 135–145)

## 2012-09-25 SURGERY — EXCISIONAL TOTAL HIP ARTHROPLASTY WITH ANTIBIOTIC SPACERS
Anesthesia: General | Site: Hip | Laterality: Left | Wound class: Dirty or Infected

## 2012-09-25 MED ORDER — ONDANSETRON HCL 4 MG/2ML IJ SOLN: 4.0000 mg | Freq: Four times a day (QID) | INTRAMUSCULAR | Status: DC | PRN

## 2012-09-25 MED ORDER — VANCOMYCIN HCL 1000 MG IV SOLR
INTRAVENOUS | Status: DC | PRN
  Administered 2012-09-25: 1 g

## 2012-09-25 MED ORDER — CHLORHEXIDINE GLUCONATE 4 % EX LIQD: 60.0000 mL | Freq: Once | CUTANEOUS | Status: DC

## 2012-09-25 MED ORDER — SUFENTANIL CITRATE 50 MCG/ML IV SOLN
INTRAVENOUS | Status: DC | PRN
  Administered 2012-09-25: 15 ug via INTRAVENOUS
  Administered 2012-09-25: 10 ug via INTRAVENOUS
  Administered 2012-09-25: 20 ug via INTRAVENOUS
  Administered 2012-09-25: 5 ug via INTRAVENOUS
  Administered 2012-09-25 (×2): 10 ug via INTRAVENOUS

## 2012-09-25 MED ORDER — CEFAZOLIN SODIUM-DEXTROSE 2-3 GM-% IV SOLR
INTRAVENOUS | Status: DC | PRN
  Administered 2012-09-25: 2 g via INTRAVENOUS

## 2012-09-25 MED ORDER — MENTHOL 3 MG MT LOZG: 1.0000 | LOZENGE | OROMUCOSAL | Status: DC | PRN

## 2012-09-25 MED ORDER — RIVAROXABAN 10 MG PO TABS
10.0000 mg | ORAL_TABLET | Freq: Every day | ORAL | Status: DC
  Administered 2012-09-26: 10 mg via ORAL
  Filled 2012-09-25 (×3): qty 1

## 2012-09-25 MED ORDER — ONDANSETRON HCL 4 MG PO TABS: 4.0000 mg | ORAL_TABLET | Freq: Four times a day (QID) | ORAL | Status: DC | PRN

## 2012-09-25 MED ORDER — MINERAL OIL LIGHT 100 % EX OIL
TOPICAL_OIL | CUTANEOUS | Status: DC | PRN
  Administered 2012-09-25: 1 via TOPICAL

## 2012-09-25 MED ORDER — ONDANSETRON HCL 4 MG/2ML IJ SOLN
INTRAMUSCULAR | Status: DC | PRN
  Administered 2012-09-25: 4 mg via INTRAVENOUS

## 2012-09-25 MED ORDER — EPHEDRINE SULFATE 50 MG/ML IJ SOLN
INTRAMUSCULAR | Status: DC | PRN
  Administered 2012-09-25 (×4): 5 mg via INTRAVENOUS
  Administered 2012-09-25: 10 mg via INTRAVENOUS
  Administered 2012-09-25 (×3): 5 mg via INTRAVENOUS

## 2012-09-25 MED ORDER — VANCOMYCIN HCL 1000 MG IV SOLR
INTRAVENOUS | Status: DC | PRN
  Administered 2012-09-25: 1000 mg

## 2012-09-25 MED ORDER — ACETAMINOPHEN 10 MG/ML IV SOLN
INTRAVENOUS | Status: DC | PRN
  Administered 2012-09-25: 1000 mg via INTRAVENOUS

## 2012-09-25 MED ORDER — HYDROMORPHONE HCL PF 1 MG/ML IJ SOLN
INTRAMUSCULAR | Status: DC | PRN
  Administered 2012-09-25 (×2): .4 mg via INTRAVENOUS

## 2012-09-25 MED ORDER — ESTROPIPATE 1.5 MG PO TABS
1.5000 mg | ORAL_TABLET | Freq: Every day | ORAL | Status: DC
  Administered 2012-09-26 – 2012-09-27 (×2): 1.5 mg via ORAL
  Filled 2012-09-25 (×2): qty 1

## 2012-09-25 MED ORDER — ACETAMINOPHEN 325 MG PO TABS
650.0000 mg | ORAL_TABLET | Freq: Four times a day (QID) | ORAL | Status: DC | PRN
  Administered 2012-09-27 (×2): 650 mg via ORAL
  Filled 2012-09-25 (×2): qty 2

## 2012-09-25 MED ORDER — LACTATED RINGERS IV SOLN
INTRAVENOUS | Status: DC
  Administered 2012-09-25: 22:00:00 via INTRAVENOUS

## 2012-09-25 MED ORDER — CISATRACURIUM BESYLATE (PF) 10 MG/5ML IV SOLN
INTRAVENOUS | Status: DC | PRN
  Administered 2012-09-25: 10 mg via INTRAVENOUS

## 2012-09-25 MED ORDER — SODIUM CHLORIDE 0.9 % IV SOLN
INTRAVENOUS | Status: DC
  Administered 2012-09-26: 01:00:00 via INTRAVENOUS
  Filled 2012-09-25 (×8): qty 1000

## 2012-09-25 MED ORDER — TOBRAMYCIN SULFATE 1.2 G IJ SOLR
INTRAMUSCULAR | Status: DC | PRN
  Administered 2012-09-25: 3.6 g

## 2012-09-25 MED ORDER — TOBRAMYCIN SULFATE 1.2 G IJ SOLR
INTRAMUSCULAR | Status: DC | PRN
  Administered 2012-09-25: 1.2 g

## 2012-09-25 MED ORDER — CALCIUM CARBONATE-VITAMIN D 500-200 MG-UNIT PO TABS
1.0000 | ORAL_TABLET | Freq: Every day | ORAL | Status: DC
  Administered 2012-09-26 – 2012-09-27 (×2): 1 via ORAL
  Filled 2012-09-25 (×2): qty 1

## 2012-09-25 MED ORDER — PROPOFOL 10 MG/ML IV BOLUS
INTRAVENOUS | Status: DC | PRN
  Administered 2012-09-25: 100 mg via INTRAVENOUS

## 2012-09-25 MED ORDER — VANCOMYCIN HCL 1000 MG IV SOLR
7000.0000 mg | INTRAVENOUS | Status: DC
  Filled 2012-09-25: qty 7000

## 2012-09-25 MED ORDER — VANCOMYCIN HCL 1000 MG IV SOLR
1000.0000 mg | INTRAVENOUS | Status: DC | PRN
  Administered 2012-09-25: 1000 mg via INTRAVENOUS

## 2012-09-25 MED ORDER — HYDROMORPHONE HCL PF 1 MG/ML IJ SOLN
0.2500 mg | INTRAMUSCULAR | Status: DC | PRN
  Administered 2012-09-25 (×2): 0.5 mg via INTRAVENOUS

## 2012-09-25 MED ORDER — VANCOMYCIN HCL IN DEXTROSE 1-5 GM/200ML-% IV SOLN
1000.0000 mg | INTRAVENOUS | Status: DC
  Administered 2012-09-26: 1000 mg via INTRAVENOUS
  Filled 2012-09-25: qty 200

## 2012-09-25 MED ORDER — HYDROMORPHONE HCL PF 1 MG/ML IJ SOLN: 0.5000 mg | INTRAMUSCULAR | Status: DC | PRN

## 2012-09-25 MED ORDER — CALCIUM CITRATE-VITAMIN D 300-100 MG-UNIT PO TABS: 1.0000 | ORAL_TABLET | Freq: Every day | ORAL | Status: DC

## 2012-09-25 MED ORDER — TOBRAMYCIN SULFATE 1.2 G IJ SOLR
8.4000 g | INTRAMUSCULAR | Status: DC
  Filled 2012-09-25: qty 8.4

## 2012-09-25 MED ORDER — LIDOCAINE HCL (CARDIAC) 20 MG/ML IV SOLN
INTRAVENOUS | Status: DC | PRN
  Administered 2012-09-25: 100 mg via INTRAVENOUS

## 2012-09-25 MED ORDER — ACETAMINOPHEN 650 MG RE SUPP: 650.0000 mg | Freq: Four times a day (QID) | RECTAL | Status: DC | PRN

## 2012-09-25 MED ORDER — POLYETHYLENE GLYCOL 3350 17 G PO PACK: 17.0000 g | PACK | Freq: Every day | ORAL | Status: DC | PRN

## 2012-09-25 MED ORDER — CEFAZOLIN SODIUM-DEXTROSE 2-3 GM-% IV SOLR: 2.0000 g | Freq: Once | INTRAVENOUS | Status: DC

## 2012-09-25 MED ORDER — METOCLOPRAMIDE HCL 10 MG PO TABS: 5.0000 mg | ORAL_TABLET | Freq: Three times a day (TID) | ORAL | Status: DC | PRN

## 2012-09-25 MED ORDER — METOCLOPRAMIDE HCL 5 MG/ML IJ SOLN: 5.0000 mg | Freq: Three times a day (TID) | INTRAMUSCULAR | Status: DC | PRN

## 2012-09-25 MED ORDER — SODIUM CHLORIDE 0.9 % IR SOLN
Status: DC | PRN
  Administered 2012-09-25: 9000 mL

## 2012-09-25 MED ORDER — HYDROCODONE-ACETAMINOPHEN 5-325 MG PO TABS: 1.0000 | ORAL_TABLET | ORAL | Status: DC | PRN

## 2012-09-25 MED ORDER — MUPIROCIN 2 % EX OINT
TOPICAL_OINTMENT | Freq: Two times a day (BID) | CUTANEOUS | Status: DC
  Administered 2012-09-25: 15:00:00 via NASAL
  Filled 2012-09-25: qty 22

## 2012-09-25 MED ORDER — ACETAMINOPHEN 10 MG/ML IV SOLN
1000.0000 mg | Freq: Four times a day (QID) | INTRAVENOUS | Status: AC
  Administered 2012-09-26 (×4): 1000 mg via INTRAVENOUS
  Filled 2012-09-25 (×4): qty 100

## 2012-09-25 MED ORDER — 0.9 % SODIUM CHLORIDE (POUR BTL) OPTIME
TOPICAL | Status: DC | PRN
  Administered 2012-09-25: 1000 mL

## 2012-09-25 MED ORDER — VANCOMYCIN HCL 1000 MG IV SOLR
INTRAVENOUS | Status: DC | PRN
  Administered 2012-09-25: 3 g

## 2012-09-25 MED ORDER — DEXAMETHASONE SODIUM PHOSPHATE 10 MG/ML IJ SOLN
INTRAMUSCULAR | Status: DC | PRN
  Administered 2012-09-25: 5 mg via INTRAVENOUS

## 2012-09-25 MED ORDER — VANCOMYCIN HCL IN DEXTROSE 1-5 GM/200ML-% IV SOLN
1000.0000 mg | Freq: Once | INTRAVENOUS | Status: DC
Start: 2012-09-25 — End: 2012-09-25

## 2012-09-25 MED ORDER — PHENOL 1.4 % MT LIQD: 1.0000 | OROMUCOSAL | Status: DC | PRN

## 2012-09-25 MED ORDER — LACTATED RINGERS IV SOLN
INTRAVENOUS | Status: DC
  Administered 2012-09-25: 20:00:00 via INTRAVENOUS
  Administered 2012-09-25: 1000 mL via INTRAVENOUS
  Administered 2012-09-25: 18:00:00 via INTRAVENOUS

## 2012-09-25 MED ORDER — METHOCARBAMOL 500 MG PO TABS: 500.0000 mg | ORAL_TABLET | Freq: Four times a day (QID) | ORAL | Status: DC | PRN

## 2012-09-25 MED ORDER — MAGNESIUM CITRATE PO SOLN: 0.5000 | Freq: Once | ORAL | Status: AC | PRN

## 2012-09-25 MED ORDER — KETAMINE HCL 10 MG/ML IJ SOLN
INTRAMUSCULAR | Status: DC | PRN
  Administered 2012-09-25: 10 mg via INTRAVENOUS
  Administered 2012-09-25 (×3): 5 mg via INTRAVENOUS
  Administered 2012-09-25: 20 mg via INTRAVENOUS
  Administered 2012-09-25: 5 mg via INTRAVENOUS

## 2012-09-25 MED ORDER — FERROUS SULFATE 325 (65 FE) MG PO TABS
325.0000 mg | ORAL_TABLET | Freq: Three times a day (TID) | ORAL | Status: DC
  Administered 2012-09-26 – 2012-09-27 (×4): 325 mg via ORAL
  Filled 2012-09-25 (×7): qty 1

## 2012-09-25 MED ORDER — METHOCARBAMOL 100 MG/ML IJ SOLN
500.0000 mg | Freq: Four times a day (QID) | INTRAVENOUS | Status: DC | PRN
  Filled 2012-09-25 (×2): qty 5

## 2012-09-25 MED ORDER — TRAMADOL HCL 50 MG PO TABS
50.0000 mg | ORAL_TABLET | Freq: Four times a day (QID) | ORAL | Status: DC | PRN
  Administered 2012-09-27 (×2): 50 mg via ORAL
  Filled 2012-09-25 (×2): qty 1

## 2012-09-25 MED ORDER — DOCUSATE SODIUM 100 MG PO CAPS
100.0000 mg | ORAL_CAPSULE | Freq: Two times a day (BID) | ORAL | Status: DC
  Administered 2012-09-26 – 2012-09-27 (×4): 100 mg via ORAL

## 2012-09-25 SURGICAL SUPPLY — 57 items
BAG SPEC THK2 15X12 ZIP CLS (MISCELLANEOUS) ×1
BAG ZIPLOCK 12X15 (MISCELLANEOUS) ×2 IMPLANT
BLADE SAW SGTL 18X1.27X75 (BLADE) ×2 IMPLANT
BOWL SMART MIX CTS (DISPOSABLE) ×2 IMPLANT
BRUSH FEMORAL CANAL (MISCELLANEOUS) ×2 IMPLANT
CEMENT HV SMART SET (Cement) ×7 IMPLANT
CLOTH BEACON ORANGE TIMEOUT ST (SAFETY) ×2 IMPLANT
DRAPE INCISE IOBAN 85X60 (DRAPES) ×2 IMPLANT
DRAPE ORTHO SPLIT 77X108 STRL (DRAPES) ×4
DRAPE POUCH INSTRU U-SHP 10X18 (DRAPES) ×2 IMPLANT
DRAPE SURG 17X11 SM STRL (DRAPES) ×2 IMPLANT
DRAPE SURG ORHT 6 SPLT 77X108 (DRAPES) ×2 IMPLANT
DRAPE U-SHAPE 47X51 STRL (DRAPES) ×2 IMPLANT
DRSG EMULSION OIL 3X16 NADH (GAUZE/BANDAGES/DRESSINGS) ×2 IMPLANT
DRSG MEPILEX BORDER 4X4 (GAUZE/BANDAGES/DRESSINGS) ×1 IMPLANT
DRSG MEPILEX BORDER 4X8 (GAUZE/BANDAGES/DRESSINGS) ×2 IMPLANT
DRSG TEGADERM 4X4.75 (GAUZE/BANDAGES/DRESSINGS) ×1 IMPLANT
DURAPREP 26ML APPLICATOR (WOUND CARE) ×2 IMPLANT
ELECT BLADE TIP CTD 4 INCH (ELECTRODE) ×2 IMPLANT
ELECT REM PT RETURN 9FT ADLT (ELECTROSURGICAL) ×2
ELECTRODE REM PT RTRN 9FT ADLT (ELECTROSURGICAL) ×1 IMPLANT
EVACUATOR 1/8 PVC DRAIN (DRAIN) ×2 IMPLANT
FACESHIELD LNG OPTICON STERILE (SAFETY) ×8 IMPLANT
GLOVE BIOGEL PI IND STRL 7.5 (GLOVE) ×1 IMPLANT
GLOVE BIOGEL PI IND STRL 8 (GLOVE) ×1 IMPLANT
GLOVE BIOGEL PI INDICATOR 7.5 (GLOVE) ×1
GLOVE BIOGEL PI INDICATOR 8 (GLOVE) ×1
GLOVE ORTHO TXT STRL SZ7.5 (GLOVE) ×4 IMPLANT
GLOVE SURG ORTHO 8.0 STRL STRW (GLOVE) ×2 IMPLANT
GOWN BRE IMP PREV XXLGXLNG (GOWN DISPOSABLE) ×4 IMPLANT
GOWN STRL NON-REIN LRG LVL3 (GOWN DISPOSABLE) ×2 IMPLANT
HANDPIECE INTERPULSE COAX TIP (DISPOSABLE) ×2
HEAD FEM STD 32X+9 STRL (Hips) ×1 IMPLANT
KIT BASIN OR (CUSTOM PROCEDURE TRAY) ×2 IMPLANT
KIT STIMULAN RAPID CURE  10CC (Orthopedic Implant) ×1 IMPLANT
KIT STIMULAN RAPID CURE 10CC (Orthopedic Implant) ×1 IMPLANT
LINER ACET CUP 42MMX32MM (Hips) ×1 IMPLANT
MANIFOLD NEPTUNE II (INSTRUMENTS) ×2 IMPLANT
NS IRRIG 1000ML POUR BTL (IV SOLUTION) ×4 IMPLANT
PACK TOTAL JOINT (CUSTOM PROCEDURE TRAY) ×2 IMPLANT
POSITIONER SURGICAL ARM (MISCELLANEOUS) ×2 IMPLANT
PRESSURIZER FEMORAL UNIV (MISCELLANEOUS) ×3 IMPLANT
SET HNDPC FAN SPRY TIP SCT (DISPOSABLE) ×1 IMPLANT
SPONGE LAP 18X18 X RAY DECT (DISPOSABLE) ×2 IMPLANT
SPONGE LAP 4X18 X RAY DECT (DISPOSABLE) ×2 IMPLANT
STAPLER VISISTAT 35W (STAPLE) ×2 IMPLANT
STEM HIGH OFFSET SIZE 1 (Stem) ×1 IMPLANT
SUCTION FRAZIER TIP 10 FR DISP (SUCTIONS) ×2 IMPLANT
SUT VIC AB 1 CT1 36 (SUTURE) ×5 IMPLANT
SUT VIC AB 2-0 CT1 27 (SUTURE) ×4
SUT VIC AB 2-0 CT1 TAPERPNT 27 (SUTURE) ×3 IMPLANT
SWAB COLLECTION DEVICE MRSA (MISCELLANEOUS) ×2 IMPLANT
TOWEL OR 17X26 10 PK STRL BLUE (TOWEL DISPOSABLE) ×4 IMPLANT
TOWER CARTRIDGE SMART MIX (DISPOSABLE) ×2 IMPLANT
TRAY FOLEY CATH 14FRSI W/METER (CATHETERS) ×2 IMPLANT
TUBE ANAEROBIC SPECIMEN COL (MISCELLANEOUS) ×2 IMPLANT
WATER STERILE IRR 1500ML POUR (IV SOLUTION) ×2 IMPLANT

## 2012-09-25 NOTE — Anesthesia Preprocedure Evaluation (Addendum)
Anesthesia Evaluation  Patient identified by MRN, date of birth, ID band Patient awake    Reviewed: Allergy & Precautions, H&P , NPO status , Patient's Chart, lab work & pertinent test results  Airway Mallampati: II TM Distance: >3 FB Neck ROM: full    Dental  (+) Dental Advisory Given and Caps,    Pulmonary neg pulmonary ROS,  breath sounds clear to auscultation  Pulmonary exam normal       Cardiovascular Exercise Tolerance: Good negative cardio ROS  Rhythm:regular Rate:Normal     Neuro/Psych negative neurological ROS  negative psych ROS   GI/Hepatic negative GI ROS, Neg liver ROS,   Endo/Other  negative endocrine ROS  Renal/GU negative Renal ROS  negative genitourinary   Musculoskeletal   Abdominal   Peds  Hematology negative hematology ROS (+)   Anesthesia Other Findings   Reproductive/Obstetrics negative OB ROS                          Anesthesia Physical Anesthesia Plan  ASA: I  Anesthesia Plan: General   Post-op Pain Management:    Induction: Intravenous  Airway Management Planned: Oral ETT  Additional Equipment:   Intra-op Plan:   Post-operative Plan: Extubation in OR  Informed Consent: I have reviewed the patients History and Physical, chart, labs and discussed the procedure including the risks, benefits and alternatives for the proposed anesthesia with the patient or authorized representative who has indicated his/her understanding and acceptance.   Dental Advisory Given  Plan Discussed with: CRNA and Surgeon  Anesthesia Plan Comments:         Anesthesia Quick Evaluation

## 2012-09-25 NOTE — Anesthesia Procedure Notes (Signed)
Procedure Name: Intubation Date/Time: 09/25/2012 6:08 PM Performed by: Leroy Libman L Preoxygenation: Pre-oxygenation with 100% oxygen Intubation Type: IV induction Ventilation: Mask ventilation without difficulty and Oral airway inserted - appropriate to patient size Laryngoscope Size: Miller and 2 Grade View: Grade I Tube type: Oral Tube size: 7.0 mm Number of attempts: 1 Airway Equipment and Method: Stylet Placement Confirmation: ETT inserted through vocal cords under direct vision,  breath sounds checked- equal and bilateral and positive ETCO2 Secured at: 20 cm Tube secured with: Tape Dental Injury: Teeth and Oropharynx as per pre-operative assessment

## 2012-09-25 NOTE — Anesthesia Postprocedure Evaluation (Signed)
  Anesthesia Post-op Note  Patient: [name]  Procedure(s) Performed: Procedure(s) (LRB): EXCISIONAL TOTAL HIP ARTHROPLASTY WITH ANTIBIOTIC SPACERS (Left)  Patient Location: PACU  Anesthesia Type: General  Level of Consciousness: awake and alert   Airway and Oxygen Therapy: Patient Spontanous Breathing  Post-op Pain: mild  Post-op Assessment: Post-op Vital signs reviewed, Patient's Cardiovascular Status Stable, Respiratory Function Stable, Patent Airway and No signs of Nausea or vomiting  Post-op Vital Signs: stable  Complications: No apparent anesthesia complications

## 2012-09-25 NOTE — Transfer of Care (Signed)
Immediate Anesthesia Transfer of Care Note  Patient: [name]  Procedure(s) Performed: Procedure(s) (LRB) with comments: EXCISIONAL TOTAL HIP ARTHROPLASTY WITH ANTIBIOTIC SPACERS (Left) - RESECTION LEFT TOTAL HIP ARTHROPLASTY AND PLACEMENT OF ANTIBIOTIC SPACERs  Patient Location: PACU  Anesthesia Type:General  Level of Consciousness: sedated  Airway & Oxygen Therapy: Patient Spontanous Breathing and Patient connected to face mask oxygen  Post-op Assessment: Report given to PACU RN and Post -op Vital signs reviewed and stable  Post vital signs: Reviewed and stable  Complications: No apparent anesthesia complications

## 2012-09-25 NOTE — H&P (Signed)
[name] is an 71 y.o. female.    Chief Complaint:   Septic left total hip arthroplasty  HPI:   [name] is a 71 year old female who has gotten in touch with [place] over the past couple of weeks with increasing pain in her left hip. She was seen in the clinic and reported increasing pain and difficulty getting around, a difficult time with sitting and getting out of positions as well as laying flat. She has had no fevers, chills, night sweats that she reports at this point. She started on Doxycycline and Cipro. After many weeks of antibiotic use she wanted to stop the antibiotics and see how things were coming along.When she was last seen on the 6th of June, Dr. Charlann Boxer ordered labs. At that time she was noted to have a significant bump in her sed rate to 100 and C-reactive protein up to 4.7. Both were significantly elevated. She was restarted on antibiotics. She comes in today in follow up. She had labwork repeated on the 19th of July. She's had a reduction in the sed rate to a 6 and a C-reactive protein drop to 3.3. She notes that she's doing a little bit better with regards to hip pain.  In the office an AP pelvis and an AP and lateral of the left hip showing stable femoral and acetabular components, no evidence of any complicating features. She appeared to have spiculated bone growth into her Zimmer cup at this point. After reviewing with Ms. Renbarger her current situation as well as reviewing what we discussed in the past at this point we are going to plan to resect her left hip and treat her with a Prostalac cemented hip from [place], antibiotic beads and IV antibiotics for six weeks. I am going to go ahead and have her get a sedimentation rate and C reactive protein today so we can monitor the progress of treatment in the postoperative period. She was given a prescription for Ultram today. Surgery was scheduled. Questions were encouraged and answered and reviewed with her. Risks, benefits  and expectations were discussed with the patient. Patient understand the risks, benefits and expectations and wishes to proceed with surgery.  PCP:  Kari Baars, MD  D/C Plans:  Home with HHPT/SNF/Rehab  Post-op Meds:  No Rx given  Tranexamic Acid:   Not to be given  Decadron:   Not to be given  PMH: Past Medical History  Diagnosis Date  . Heart murmur   . Arthritis     PSH: Past Surgical History  Procedure Date  . Abdominal hysterectomy     age 83  . Eye surgery 02/2010    bilateral cataracts  . Appendectomy   . Joint replacement 2006    left hip, revision 2009 and 07/2011  . Joint replacement 10/2007    right hip, revision 12/2008  . 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 She does not have any smokeless tobacco history on file. She reports that she drinks about .6 ounces of alcohol per week. She reports that she does not use illicit drugs.  Allergies:  Allergies  Allergen Reactions  . Bactrim Rash    Medications: No current facility-administered medications for  this encounter.   Current Outpatient Prescriptions  Medication Sig Dispense Refill  . aspirin 81 MG tablet Take 160 mg by mouth daily.      . Calcium Citrate-Vitamin D (CALCIUM CITRATE + D PO) Take 1 tablet by mouth daily.       Marland Kitchen estropipate (OGEN) 1.5 MG tablet Take 1.5 mg by mouth daily.       . ferrous sulfate 325 (65 FE) MG tablet Take 1 tablet (325 mg total) by mouth 3 (three) times daily after meals.      . rifampin (RIFADIN) 300 MG capsule          ROS: Review of Systems  Constitutional: Negative.   HENT: Negative.   Eyes: Negative.   Respiratory: Negative.   Cardiovascular: Negative.        Murmur    Gastrointestinal: Negative.   Genitourinary: Negative.   Musculoskeletal: Positive for joint pain.  Skin: Negative.   Neurological: Negative.   Endo/Heme/Allergies: Negative.   Psychiatric/Behavioral: Negative.      Physical Exam: Physical Exam  Constitutional: She is oriented to person, place, and time and well-developed, well-nourished, and in no distress.  HENT:  Head: Normocephalic and atraumatic.  Nose: Nose normal.  Mouth/Throat: Oropharynx is clear and moist.  Eyes: Pupils are equal, round, and reactive to light.  Neck: Neck supple. No JVD present. No tracheal deviation present. No thyromegaly present.  Cardiovascular: Normal rate, regular rhythm and intact distal pulses.   Murmur heard. Pulmonary/Chest: Effort normal and breath sounds normal. No respiratory distress. She has no wheezes.  Abdominal: Soft. There is no tenderness. There is no guarding.  Musculoskeletal:       Left hip: She exhibits decreased range of motion, decreased strength, tenderness, bony tenderness, swelling and laceration. She exhibits no deformity.  Lymphadenopathy:    She has no cervical adenopathy.  Neurological: She is alert and oriented to person, place, and time.  Skin: Skin is warm and dry.  Psychiatric: Affect normal.      Assessment/Plan Assessment:   Septic left total hip arthroplasty  Plan: Patient will undergo a resection of the left total hip arthroplasty on 09/25/2012 per Dr. Charlann Boxer at [facility]. Risks benefits and expectations were discussed with the patient. Patient understand risks, benefits and expectations and wishes to proceed.   Anastasio Auerbach Johna Kearl   PAC  09/25/2012, 12:10 PM

## 2012-09-25 NOTE — Interval H&P Note (Signed)
History and Physical Interval Note:  09/25/2012 5:46 PM  [name]  has presented today for surgery, with the diagnosis of infected left total hip arthroplasty   The various methods of treatment have been discussed with the patient and family. After consideration of risks, benefits and other options for treatment, the patient has consented to  Procedure(s) (LRB) with comments: EXCISIONAL TOTAL HIP ARTHROPLASTY WITH ANTIBIOTIC SPACERS (Left) - RESECTION LEFT TOTAL HIP ARTHROPLASTY AND PLACEMENT OF ANTIBIOTIC SPACER as a surgical intervention .  The patient's history has been reviewed, patient examined, no change in status, stable for surgery.  I have reviewed the patient's chart and labs.  Questions were answered to the patient's satisfaction.     [name]

## 2012-09-25 NOTE — Preoperative (Signed)
Beta Blockers   Reason not to administer Beta Blockers:Not Applicable 

## 2012-09-26 DIAGNOSIS — T8450XA Infection and inflammatory reaction due to unspecified internal joint prosthesis, initial encounter: Secondary | ICD-10-CM

## 2012-09-26 DIAGNOSIS — Y831 Surgical operation with implant of artificial internal device as the cause of abnormal reaction of the patient, or of later complication, without mention of misadventure at the time of the procedure: Secondary | ICD-10-CM

## 2012-09-26 LAB — BASIC METABOLIC PANEL
BUN: 8 mg/dL (ref 6–23)
Calcium: 8 mg/dL — ABNORMAL LOW (ref 8.4–10.5)
GFR calc non Af Amer: >90 mL/min (ref >90)
Glucose, Bld: 167 mg/dL — ABNORMAL HIGH (ref 70–99)

## 2012-09-26 LAB — CBC
HCT: 21.5 % — ABNORMAL LOW (ref 36.0–46.0)
Hemoglobin: 7.3 g/dL — ABNORMAL LOW (ref 12.0–15.0)
MCH: 30.5 pg (ref 26.0–34.0)
MCHC: 34 g/dL (ref 30.0–36.0)

## 2012-09-26 MED ORDER — ASPIRIN EC 325 MG PO TBEC
325.0000 mg | DELAYED_RELEASE_TABLET | Freq: Two times a day (BID) | ORAL | Status: DC
  Administered 2012-09-27: 325 mg via ORAL
  Filled 2012-09-26 (×2): qty 1

## 2012-09-26 MED ORDER — SODIUM CHLORIDE 0.9 % IJ SOLN
10.0000 mL | INTRAMUSCULAR | Status: DC | PRN
  Administered 2012-09-27: 10 mL

## 2012-09-26 MED ORDER — RIFAMPIN 300 MG PO CAPS
300.0000 mg | ORAL_CAPSULE | Freq: Two times a day (BID) | ORAL | Status: DC
  Administered 2012-09-27: 300 mg via ORAL
  Filled 2012-09-26 (×2): qty 1

## 2012-09-26 MED ORDER — SODIUM CHLORIDE 0.9 % IV BOLUS (SEPSIS)
500.0000 mL | Freq: Once | INTRAVENOUS | Status: AC
  Administered 2012-09-26: 500 mL via INTRAVENOUS

## 2012-09-26 MED ORDER — DEXTROSE 5 % IV SOLN
2.0000 g | INTRAVENOUS | Status: DC
  Administered 2012-09-26: 2 g via INTRAVENOUS
  Filled 2012-09-26 (×2): qty 2

## 2012-09-26 NOTE — Care Management Note (Signed)
    Page 1 of 2   09/26/2012     5:43:28 PM   CARE MANAGEMENT NOTE 09/26/2012  Patient:  [name], [name]   Account Number:  [id number]  Date Initiated:  09/26/2012  Documentation initiated by:  Colleen Can  Subjective/Objective Assessment:   dx infection -left hip arthroplasty; resection left total hip with placemnt antibiotic spacer  Pre-arranged with Genevieve Norlander prior to admission.     Action/Plan:   Cm spoke with patient. Plans are for her to return to her home in [place], where spouse will be caregiver. She already has DME and plans to use [place] for [facility] services.   Anticipated DC Date:  09/27/2012   Anticipated DC Plan:  HOME W HOME HEALTH SERVICES  In-house referral  NA      DC Planning Services  CM consult      [facility] Choice  HOME HEALTH   Choice offered to / List presented to:  C-1 Patient   DME arranged  NA      DME agency  NA     HH arranged  HH-2 PT  IV Antibiotics      HH agency  [facility]   Status of service:  Completed, signed off Medicare Important Message given?  NA - LOS <3 / Initial given by admissions (If response is "NO", the following Medicare IM given date fields will be blank) Date Medicare IM given:   Date Additional Medicare IM given:    Discharge Disposition:    Per UR Regulation:    If discussed at Long Length of Stay Meetings, dates discussed:    Comments:  09/26/2012 Damaris Schooner RN CCM [phone] Genevieve Norlander will provide [facility] services and arrange for home antibiotic IV therapy. They will arrange with [facility] to provide IV antibiotic meds

## 2012-09-26 NOTE — Progress Notes (Signed)
OT Screen Order received, chart reviewed. Spoke briefly with pt who stated that this is her 5th hip sx and that she has all necessary DME and AE at home. Pt will have prn A from family. Pt presents with no OT needs at this time. Will sign off.  Garrel Ridgel, OTR/L  Pager [phone] 09/26/2012

## 2012-09-26 NOTE — Progress Notes (Signed)
   Subjective: 1 Day Post-Op Procedure(s) (LRB): EXCISIONAL TOTAL HIP ARTHROPLASTY WITH ANTIBIOTIC SPACERS (Left)   Patient reports pain as mild, pain well controlled. No events throughout the night.  Objective:   VITALS:   Filed Vitals:   09/26/12 0511  BP: 116/66  Pulse: 92  Temp: 97.6 F (36.4 C)  Resp: 16    Neurovascular intact Dorsiflexion/Plantar flexion intact Incision: dressing C/D/I No cellulitis present Compartment soft  LABS  Basename 09/26/12 0437 09/25/12 2028 09/25/12 1455  HGB 7.3* 8.5* 11.2*  HCT 21.5* 25.0* 33.4*  WBC 6.3 -- 4.2  PLT 322 -- 437*     Basename 09/26/12 0437 09/25/12 2028 09/25/12 1455  NA 133* 134* 134*  K 4.6 4.0 4.1  BUN 8 -- 14  CREATININE 0.47* -- 0.47*  GLUCOSE 167* 160* 85     Assessment/Plan: 1 Day Post-Op Procedure(s) (LRB): EXCISIONAL TOTAL HIP ARTHROPLASTY WITH ANTIBIOTIC SPACERS (Left) HV drain d/c'ed Foley cath d/c'ed Advance diet Up with therapy PICC line ordered ID Consult called, was told Dr. Daiva Eves will be seeing the patient Discharge home with home health eventually, with home IV antibiotic treatments  Expected ABLA  Treated with iron and will observe  Hyponatremia Treated with IV fluids and will observe       Anastasio Auerbach. Raphaela Cannaday   PAC  09/26/2012, 9:44 AM

## 2012-09-26 NOTE — Progress Notes (Signed)
Physical Therapy Treatment Patient Details Name: [name] MRN: [id number] DOB: 08-08-1941 Today's Date: 09/26/2012 Time: 0827-0905 PT Time Calculation (min): 38 min  PT Assessment / Plan / Recommendation Comments on Treatment Session       Follow Up Recommendations  Home health PT     Does the patient have the potential to tolerate intense rehabilitation     Barriers to Discharge None      Equipment Recommendations  None recommended by PT    Recommendations for Other Services    Frequency 7X/week   Plan      Precautions / Restrictions Precautions Precautions: Posterior Hip Restrictions Weight Bearing Restrictions: Yes LLE Weight Bearing: Partial weight bearing   Pertinent Vitals/Pain 2/10; premedicated, ice pack provided    Mobility  Bed Mobility Bed Mobility: Supine to Sit Supine to Sit: 4: Min assist Details for Bed Mobility Assistance: cues for sequence and use of R LE to self assist.  MIn assist required to mobilize L LE Transfers Transfers: Sit to Stand;Stand to Sit Sit to Stand: 4: Min assist Stand to Sit: 4: Min assist Details for Transfer Assistance: cues for LE management and use of UEs to self assist Ambulation/Gait Ambulation/Gait Assistance: 4: Min assist Ambulation Distance (Feet): 49 Feet (and 20) Assistive device: Rolling walker Ambulation/Gait Assistance Details: min cues for sequence, step to and stride length Gait Pattern: Step-to pattern    Exercises     PT Diagnosis: Difficulty walking  PT Problem List: Decreased strength;Decreased range of motion;Decreased activity tolerance;Decreased mobility;Decreased knowledge of use of DME;Pain PT Treatment Interventions: DME instruction;Gait training;Stair training;Functional mobility training;Therapeutic activities;Therapeutic exercise;Patient/family education   PT Goals Acute Rehab PT Goals PT Goal Formulation: With patient Time For Goal Achievement: 10/02/12 Potential to Achieve Goals:  Good Pt will go Supine/Side to Sit: with supervision PT Goal: Supine/Side to Sit - Progress: Goal set today Pt will go Sit to Supine/Side: with supervision PT Goal: Sit to Supine/Side - Progress: Goal set today Pt will go Sit to Stand: with supervision PT Goal: Sit to Stand - Progress: Goal set today Pt will go Stand to Sit: with supervision PT Goal: Stand to Sit - Progress: Goal set today Pt will Ambulate: 51 - 150 feet;with supervision;with rolling walker PT Goal: Ambulate - Progress: Goal set today Pt will Go Up / Down Stairs: 1-2 stairs;with min assist;with least restrictive assistive device PT Goal: Up/Down Stairs - Progress: Goal set today  Visit Information  Last PT Received On: 09/26/12 Assistance Needed: +1    Subjective Data  Subjective: My hip hurts less now than it did before surgery Patient Stated Goal: Get new hip and resume active pain free lifestyle   Cognition  Overall Cognitive Status: Appears within functional limits for tasks assessed/performed Arousal/Alertness: Awake/alert Orientation Level: Appears intact for tasks assessed Behavior During Session: [facility] for tasks performed    Balance     End of Session     GP     Tamaya Pun 09/26/2012, 5:22 PM

## 2012-09-26 NOTE — Progress Notes (Signed)
Utilization review completed.  

## 2012-09-26 NOTE — Progress Notes (Signed)
ANTIBIOTIC CONSULT NOTE - INITIAL  Pharmacy Consult for Vancomycin Indication: Infected hip prosthesis  Allergies  Allergen Reactions  . Bactrim Rash    Patient Measurements: Height: 5\' 1"  (154.9 cm) Weight: 104 lb 2 oz (47.231 kg) IBW/kg (Calculated) : 47.8    Vital Signs: Temp: 97.6 F (36.4 C) (11/13 0055) Temp src: Axillary (11/13 0055) BP: 91/54 mmHg (11/13 0055) Pulse Rate: 85  (11/13 0055) Intake/Output from previous day: 11/12 0701 - 11/13 0700 In: 4075 [I.V.:4075] Out: 1620 [Urine:480; Drains:40; Blood:1100] Intake/Output from this shift: Total I/O In: 3075 [I.V.:3075] Out: 1170 [Urine:180; Drains:40; Blood:950]  Labs:  [place] [facility] 09/25/12 2028 09/25/12 1455  WBC -- 4.2  HGB 8.5* 11.2*  PLT -- 437*  LABCREA -- --  CREATININE -- 0.47*   Estimated Creatinine Clearance: 48.1 ml/min (by C-G formula based on Cr of 0.47). No results found for this basename: VANCOTROUGH:2,VANCOPEAK:2,VANCORANDOM:2,GENTTROUGH:2,GENTPEAK:2,GENTRANDOM:2,TOBRATROUGH:2,TOBRAPEAK:2,TOBRARND:2,AMIKACINPEAK:2,AMIKACINTROU:2,AMIKACIN:2, in the last 72 hours   Microbiology: Recent Results (from the past 720 hour(s))  SURGICAL PCR SCREEN     Status: Normal   Collection Time   09/25/12  2:19 PM      Component Value Range Status Comment   MRSA, PCR NEGATIVE  NEGATIVE Final    Staphylococcus aureus NEGATIVE  NEGATIVE Final     Medical History: Past Medical History  Diagnosis Date  . Heart murmur   . Arthritis     Medications:  Scheduled:    . acetaminophen  1,000 mg Intravenous Q6H  . calcium-vitamin D  1 tablet Oral Daily  . docusate sodium  100 mg Oral BID  . estropipate  1.5 mg Oral Daily  . ferrous sulfate  325 mg Oral TID PC  . rivaroxaban  10 mg Oral Q breakfast  . vancomycin  1,000 mg Intravenous Q24H  . [DISCONTINUED] Calcium Citrate-Vitamin D  1 tablet Oral Daily  . [DISCONTINUED]  ceFAZolin (ANCEF) IV  2 g Intravenous Once  . [DISCONTINUED] chlorhexidine  60 mL  Topical Once  . [DISCONTINUED] mupirocin ointment   Nasal BID  . [DISCONTINUED] tobramycin  8.4 g Topical To OR  . [DISCONTINUED] vancomycin  1,000 mg Intravenous Once  . [DISCONTINUED] vancomycin  7,000 mg Other To OR   Infusions:    . sodium chloride 0.9 % 1,000 mL with potassium chloride 10 mEq infusion 75 mL/hr at 09/26/12 0125  . [DISCONTINUED] lactated ringers 125 mL/hr at 09/25/12 2211  . [DISCONTINUED] lactated ringers     Assessment: 71 yo admitted with infected THA.  MD ordering Vancomycin per RX.  Goal of Therapy:  Vancomycin trough level 15-20 mcg/ml  Plan:   Vancomycin 1Gm IV q24h.  CrCl~59 (N)  F/U SCr/levels as needed.  Susanne Greenhouse R 09/26/2012,1:37 AM

## 2012-09-26 NOTE — Progress Notes (Signed)
Patient hypotensive but asymptomatic, urine output currently less than 30 cc per hour.  Rexene Edison PA paged, new orders received.  Will continue to monitor.

## 2012-09-26 NOTE — Progress Notes (Signed)
Peripherally Inserted Central Catheter/Midline Placement  The IV Nurse has discussed with the patient and/or persons authorized to consent for the patient, the purpose of this procedure and the potential benefits and risks involved with this procedure.  The benefits include less needle sticks, lab draws from the catheter and patient may be discharged home with the catheter.  Risks include, but not limited to, infection, bleeding, blood clot (thrombus formation), and puncture of an artery; nerve damage and irregular heat beat.  Alternatives to this procedure were also discussed.  PICC/Midline Placement Documentation  PICC / Midline Single Lumen 11/12/11 PICC Right Basilic (Active)       Franne Grip Renee 09/26/2012, 12:14 PM

## 2012-09-26 NOTE — Consult Note (Addendum)
[facility]    Date of Admission:  09/25/2012  Date of Consult:  09/26/2012  Reason for Consult:Infected THA Referring Physician: Dr. Charlann Boxer   HPI: Laramie Vanwormer is an 71 y.o. female with PMHX significant for THA left and right . The left was done originally in 2006, then revised due to recall and elevated metals in Sept 2012. In late NOvember the patient developed pain and acute swelling about her prosthesis in latehad aspiration which showed >80,000 WBCs which were predominantly PMNs and no bacteria were seen on gram stain and none were recovered on culture. She underwent I and D with poly exchange on 11/11/11 and was seen by my partner Dr. Maurice March and  then treated with 6 weeks of IV vancomycin , oral ciprofloxacin and oral rifampin with no organism ever having been isolated. She was seen in [facility] by Dr. Maurice March in Feb of 2012 and he wrote for an additional 6 wks of oral doxycline. She then had gone and off antibiotics apparently with her having stoppedin August but then with recurrence of pain and increase in ESR, CRP and resumption of her cipro and doxy. Her pain persisted however and she was ultimately brought in by Dr. Charlann Boxer for I and D and Excision of THA with placment of antibiotic covered spacers. Intraoperative cultures so far without growth. Pt is on vancomycin postoperatively. She appears to have also received preoperative cefazolin (not clear why she received that).   I spent greater than 45 minutes with the patient including greater than 50% of time in face to face counsel of the patient and in coordination of their care.    Past Medical History  Diagnosis Date  . Heart murmur   . Arthritis     Past Surgical History  Procedure Date  . Abdominal hysterectomy     age 50  . Eye surgery 02/2010    bilateral cataracts  . Appendectomy   . Joint replacement 2006    left hip, revision 2009 and 07/2011  . Joint replacement 10/2007    right hip,  revision 12/2008  . 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 Bactrim Rash     Medications: I have reviewed patients current medications as documented in Epic Anti-infectives     Start     Dose/Rate Route Frequency Ordered Stop   09/26/12 1800   vancomycin (VANCOCIN) IVPB 1000 mg/200 mL premix     Comments: Consult pharmacy      1,000 mg 200 mL/hr over 60 Minutes Intravenous Every 24 hours 09/25/12 2306     09/25/12 2033   vancomycin (VANCOCIN) powder  Status:  Discontinued          As needed 09/25/12 2033 09/25/12 2131   09/25/12 2032   tobramycin (NEBCIN) powder  Status:  Discontinued          As needed 09/25/12 2032 09/25/12 2131   09/25/12 2030   vancomycin (VANCOCIN) powder  Status:  Discontinued          As needed 09/25/12 2030 09/25/12 2131   09/25/12 2021   tobramycin (NEBCIN) powder  Status:  Discontinued          As needed 09/25/12 2029 09/25/12  2131   09/25/12 2011   tobramycin (NEBCIN) powder  Status:  Discontinued          As needed 09/25/12 2011 09/25/12 2131   09/25/12 2011   vancomycin (VANCOCIN) powder  Status:  Discontinued          As needed 09/25/12 2012 09/25/12 2131   09/25/12 2009   vancomycin (VANCOCIN) powder  Status:  Discontinued          As needed 09/25/12 2010 09/25/12 2131   09/25/12 2008   tobramycin (NEBCIN) powder  Status:  Discontinued          As needed 09/25/12 2009 09/25/12 2131   09/25/12 1500   vancomycin (VANCOCIN) IVPB 1000 mg/200 mL premix  Status:  Discontinued        1,000 mg 200 mL/hr over 60 Minutes Intravenous  Once 09/25/12 1419 09/25/12 2235   09/25/12 1500   ceFAZolin (ANCEF) IVPB 2 g/50 mL premix  Status:  Discontinued        2 g 100 mL/hr over 30  Minutes Intravenous  Once 09/25/12 1419 09/25/12 2235   09/25/12 1430   vancomycin (VANCOCIN) powder 7,000 mg  Status:  Discontinued        7,000 mg Other To Surgery 09/25/12 1419 09/25/12 2235   09/25/12 1430   tobramycin (NEBCIN) powder 8.4 g  Status:  Discontinued        8.4 g Topical To Surgery 09/25/12 1419 09/25/12 2235          Social History:  reports that she has never smoked. She does not have any smokeless tobacco history on file. She reports that she drinks about .6 ounces of alcohol per week. She reports that she does not use illicit drugs.  History reviewed. No pertinent family history.  As in HPI and primary teams notes otherwise 12 point review of systems is negative  Blood pressure 79/41, pulse 89, temperature 98.3 F (36.8 C), temperature source Oral, resp. rate 16, height 5\' 1"  (1.549 m), weight 104 lb 2 oz (47.231 kg), SpO2 98.00%. General: Alert and awake, oriented x3, not in any acute distress. HEENT: anicteric sclera,  EOMI, oropharynx clear and without exudate CVS regular rate, normal r,  no murmur rubs or gallops Chest: clear to auscultation bilaterally, no wheezing, rales or rhonchi Abdomen: soft nontender, nondistended, n Extremities: left hip with bandage and drain with bloody material Skin: no rashes Neuro: nonfocal, strength and sensation intact   Results for orders placed during the hospital encounter of 09/25/12 (from the past 48 hour(s))  SURGICAL PCR SCREEN     Status: Normal   Collection Time   09/25/12  2:19 PM      Component Value Range Comment   MRSA, PCR NEGATIVE  NEGATIVE    Staphylococcus aureus NEGATIVE  NEGATIVE   URINALYSIS, ROUTINE W REFLEX MICROSCOPIC     Status: Normal   Collection Time   09/25/12  2:19 PM      Component Value Range Comment   Color, Urine YELLOW  YELLOW    APPearance CLEAR  CLEAR    Specific Gravity, Urine 1.008  1.005 - 1.030    pH 6.5  5.0 - 8.0    Glucose, UA NEGATIVE  NEGATIVE mg/dL    Hgb urine dipstick  NEGATIVE  NEGATIVE    Bilirubin Urine NEGATIVE  NEGATIVE    Ketones, ur NEGATIVE  NEGATIVE mg/dL    Protein, ur NEGATIVE  NEGATIVE mg/dL    Urobilinogen, UA 0.2  0.0 - 1.0 mg/dL    Nitrite NEGATIVE  NEGATIVE    Leukocytes, UA NEGATIVE  NEGATIVE MICROSCOPIC NOT DONE ON URINES WITH NEGATIVE PROTEIN, BLOOD, LEUKOCYTES, NITRITE, OR GLUCOSE <1000 mg/dL.  CBC     Status: Abnormal   Collection Time   09/25/12  2:55 PM      Component Value Range Comment   WBC 4.2  4.0 - 10.5 K/uL    RBC 3.71 (*) 3.87 - 5.11 MIL/uL    Hemoglobin 11.2 (*) 12.0 - 15.0 g/dL    HCT 29.5 (*) 62.1 - 46.0 %    MCV 90.0  78.0 - 100.0 fL    MCH 30.2  26.0 - 34.0 pg    MCHC 33.5  30.0 - 36.0 g/dL    RDW 30.8  65.7 - 84.6 %    Platelets 437 (*) 150 - 400 K/uL   BASIC METABOLIC PANEL     Status: Abnormal   Collection Time   09/25/12  2:55 PM      Component Value Range Comment   Sodium 134 (*) 135 - 145 mEq/L    Potassium 4.1  3.5 - 5.1 mEq/L    Chloride 98  96 - 112 mEq/L    CO2 25  19 - 32 mEq/L    Glucose, Bld 85  70 - 99 mg/dL    BUN 14  6 - 23 mg/dL    Creatinine, Ser 9.62 (*) 0.50 - 1.10 mg/dL    Calcium 9.4  8.4 - 95.2 mg/dL    GFR calc non Af Amer >90  >90 mL/min    GFR calc Af Amer >90  >90 mL/min   PROTIME-INR     Status: Normal   Collection Time   09/25/12  2:55 PM      Component Value Range Comment   Prothrombin Time 13.0  11.6 - 15.2 seconds    INR 0.99  0.00 - 1.49   APTT     Status: Abnormal   Collection Time   09/25/12  2:55 PM      Component Value Range Comment   aPTT 39 (*) 24 - 37 seconds   TYPE AND SCREEN     Status: Normal   Collection Time   09/25/12  2:55 PM      Component Value Range Comment   ABO/RH(D) A POS      Antibody Screen NEG      Sample Expiration 09/28/2012     WOUND CULTURE     Status: Normal (Preliminary result)   Collection Time   09/25/12  6:46 PM      Component Value Range Comment   Specimen Description WOUND HIP LEFT      Special Requests NONE      Gram  Stain        Value: FEW WBC PRESENT,BOTH PMN AND MONONUCLEAR     NO SQUAMOUS EPITHELIAL CELLS SEEN     NO ORGANISMS SEEN   Culture NO GROWTH      Report Status PENDING     ANAEROBIC CULTURE     Status: Normal (Preliminary result)   Collection Time   09/25/12  6:46 PM      Component Value Range Comment   Specimen Description WOUND HIP LEFT      Special Requests NONE      Gram Stain PENDING      Culture        Value: NO ANAEROBES ISOLATED; CULTURE IN PROGRESS FOR 5 DAYS   Report  Status PENDING     WOUND CULTURE     Status: Normal (Preliminary result)   Collection Time   09/25/12  7:21 PM      Component Value Range Comment   Specimen Description WOUND LEFT AC      Special Requests NONE      Gram Stain PENDING      Culture NO GROWTH      Report Status PENDING     ANAEROBIC CULTURE     Status: Normal (Preliminary result)   Collection Time   09/25/12  7:21 PM      Component Value Range Comment   Specimen Description WOUND LEFT AC      Special Requests NONE      Gram Stain        Value: FEW WBC PRESENT, PREDOMINANTLY MONONUCLEAR     NO SQUAMOUS EPITHELIAL CELLS SEEN     NO ORGANISMS SEEN   Culture        Value: NO ANAEROBES ISOLATED; CULTURE IN PROGRESS FOR 5 DAYS   Report Status PENDING     POCT I-STAT 4, (NA,K, GLUC, HGB,HCT)     Status: Abnormal   Collection Time   09/25/12  8:28 PM      Component Value Range Comment   Sodium 134 (*) 135 - 145 mEq/L    Potassium 4.0  3.5 - 5.1 mEq/L    Glucose, Bld 160 (*) 70 - 99 mg/dL    HCT 40.9 (*) 81.1 - 46.0 %    Hemoglobin 8.5 (*) 12.0 - 15.0 g/dL   CBC     Status: Abnormal   Collection Time   09/26/12  4:37 AM      Component Value Range Comment   WBC 6.3  4.0 - 10.5 K/uL    RBC 2.39 (*) 3.87 - 5.11 MIL/uL    Hemoglobin 7.3 (*) 12.0 - 15.0 g/dL    HCT 91.4 (*) 78.2 - 46.0 %    MCV 90.0  78.0 - 100.0 fL    MCH 30.5  26.0 - 34.0 pg    MCHC 34.0  30.0 - 36.0 g/dL    RDW 95.6  21.3 - 08.6 %    Platelets 322  150 - 400 K/uL     BASIC METABOLIC PANEL     Status: Abnormal   Collection Time   09/26/12  4:37 AM      Component Value Range Comment   Sodium 133 (*) 135 - 145 mEq/L    Potassium 4.6  3.5 - 5.1 mEq/L    Chloride 101  96 - 112 mEq/L    CO2 27  19 - 32 mEq/L    Glucose, Bld 167 (*) 70 - 99 mg/dL    BUN 8  6 - 23 mg/dL    Creatinine, Ser 5.78 (*) 0.50 - 1.10 mg/dL    Calcium 8.0 (*) 8.4 - 10.5 mg/dL    GFR calc non Af Amer >90  >90 mL/min    GFR calc Af Amer >90  >90 mL/min       Component Value Date/Time   SDES WOUND LEFT AC 09/25/2012 1921   SDES WOUND LEFT AC 09/25/2012 1921   SPECREQUEST NONE 09/25/2012 1921   SPECREQUEST NONE 09/25/2012 1921   CULT NO GROWTH 09/25/2012 1921   CULT NO ANAEROBES ISOLATED; CULTURE IN PROGRESS FOR 5 DAYS 09/25/2012 1921   REPTSTATUS PENDING 09/25/2012 1921   REPTSTATUS PENDING 09/25/2012 1921   Dg Chest 2 View  09/25/2012  *  RADIOLOGY REPORT*  Clinical Data: Preop for left knee surgery.  CHEST - 2 VIEW  Comparison: Two-view chest 10/23/2008.  Findings: The heart size is normal.  A granuloma in the left upper lobe is stable.  No focal airspace disease is present.  Mild degenerative changes are noted in the thoracic spine.  IMPRESSION:  1.  No acute cardiopulmonary disease or significant interval change.   Original Report Authenticated By: Marin Roberts, M.D.    Dg Pelvis Portable  09/25/2012  *RADIOLOGY REPORT*  Clinical Data: Postop left hip surgery.  PORTABLE PELVIS  Comparison: 11/11/2011  Findings: Postoperative changes from revision of the left total hip replacement.  Removal of the acetabular component and revision of the femoral component.  Soft tissue drains in place.  Fractures through the left inferior pubic ramus again noted.  Antibiotic beads around the left hip.  IMPRESSION: Revision of the left hip replacement as above.   Original Report Authenticated By: Charlett Nose, M.D.      Recent Results (from the past 720 hour(s))  SURGICAL PCR SCREEN      Status: Normal   Collection Time   09/25/12  2:19 PM      Component Value Range Status Comment   MRSA, PCR NEGATIVE  NEGATIVE Final    Staphylococcus aureus NEGATIVE  NEGATIVE Final   WOUND CULTURE     Status: Normal (Preliminary result)   Collection Time   09/25/12  6:46 PM      Component Value Range Status Comment   Specimen Description WOUND HIP LEFT   Final    Special Requests NONE   Final    Gram Stain     Final    Value: FEW WBC PRESENT,BOTH PMN AND MONONUCLEAR     NO SQUAMOUS EPITHELIAL CELLS SEEN     NO ORGANISMS SEEN   Culture NO GROWTH   Final    Report Status PENDING   Incomplete   ANAEROBIC CULTURE     Status: Normal (Preliminary result)   Collection Time   09/25/12  6:46 PM      Component Value Range Status Comment   Specimen Description WOUND HIP LEFT   Final    Special Requests NONE   Final    Gram Stain PENDING   Incomplete    Culture     Final    Value: NO ANAEROBES ISOLATED; CULTURE IN PROGRESS FOR 5 DAYS   Report Status PENDING   Incomplete   WOUND CULTURE     Status: Normal (Preliminary result)   Collection Time   09/25/12  7:21 PM      Component Value Range Status Comment   Specimen Description WOUND LEFT AC   Final    Special Requests NONE   Final    Gram Stain PENDING   Incomplete    Culture NO GROWTH   Final    Report Status PENDING   Incomplete   ANAEROBIC CULTURE     Status: Normal (Preliminary result)   Collection Time   09/25/12  7:21 PM      Component Value Range Status Comment   Specimen Description WOUND LEFT AC   Final    Special Requests NONE   Final    Gram Stain     Final    Value: FEW WBC PRESENT, PREDOMINANTLY MONONUCLEAR     NO SQUAMOUS EPITHELIAL CELLS SEEN     NO ORGANISMS SEEN   Culture     Final    Value: NO ANAEROBES ISOLATED; CULTURE  IN PROGRESS FOR 5 DAYS   Report Status PENDING   Incomplete      Impression/Recommendation  71 year old with infected THA sp removal and placement of antibiotic spacers (my understanding  is there is metal in spacer but they are covered with abx cement/  #1 Infected THA:   --Agree with vancomycin dosed by pharmacy here and by infusion co for trough of 15-20 to cover CNS, diptheroids (that might not have grown well on cx in 12/12, MRSA, MSSA, gram positives.  -- I would add rocephin 2 grams daily for better bactericidal activity of possible strep species that could have been involved (and also might not have grown the first time).   --If there is indeed metal placed into the hip it might be prudent to add rifampin 300mg  bid to penetrate any possible biolayers.  -- I would like to make sure she receives at least 6 weeks of postoperative antibiotics with close fu with Dr. Charlann Boxer and with [place] in [facility] prior to stopping the abx. stop date = 11/06/12    I will add esr and crp if not already done so.  Home infusion co will need to draw and fax weekly esr, crp cbc and bmp to Dr. Daiva Eves @ [phone]  Thank you so much for this interesting consult  [facility] [phone] (pager) [phone] (office) 09/26/2012, 2:42 PM  Paulette Blanch Dam 09/26/2012, 2:42 PM

## 2012-09-26 NOTE — Progress Notes (Signed)
Physical Therapy Treatment Patient Details Name: [name] MRN: [id number] DOB: 10-Dec-1940 Today's Date: 09/26/2012 Time: 5621-3086 PT Time Calculation (min): 23 min  PT Assessment / Plan / Recommendation Comments on Treatment Session       Follow Up Recommendations  Home health PT     Does the patient have the potential to tolerate intense rehabilitation     Barriers to Discharge None      Equipment Recommendations  None recommended by PT    Recommendations for Other Services    Frequency 7X/week   Plan Discharge plan remains appropriate    Precautions / Restrictions Precautions Precautions: Posterior Hip Restrictions Weight Bearing Restrictions: Yes LLE Weight Bearing: Partial weight bearing   Pertinent Vitals/Pain     Mobility  Bed Mobility Bed Mobility: Supine to Sit;Sit to Supine Supine to Sit: 4: Min assist Sit to Supine: 4: Min assist Details for Bed Mobility Assistance: cues for sequence and use of R LE to self assist.  MIn assist required to mobilize L LE Transfers Transfers: Sit to Stand;Stand to Sit Sit to Stand: 4: Min assist;From bed;From chair/3-in-1;With upper extremity assist Stand to Sit: 4: Min assist;To bed;To chair/3-in-1 Details for Transfer Assistance: cues for LE management and use of UEs to self assist Ambulation/Gait Ambulation/Gait Assistance: 4: Min assist Ambulation Distance (Feet): 128 Feet (and 20) Assistive device: Rolling walker Ambulation/Gait Assistance Details: min cues for postion from RW Gait Pattern: Step-to pattern    Exercises General Exercises - Lower Extremity Ankle Circles/Pumps: AROM;10 reps;Supine;Both Quad Sets: AROM;10 reps;Supine;Both   PT Diagnosis: Difficulty walking  PT Problem List: Decreased strength;Decreased range of motion;Decreased activity tolerance;Decreased mobility;Decreased knowledge of use of DME;Pain PT Treatment Interventions: DME instruction;Gait training;Stair training;Functional  mobility training;Therapeutic activities;Therapeutic exercise;Patient/family education   PT Goals Acute Rehab PT Goals PT Goal Formulation: With patient Time For Goal Achievement: 10/02/12 Potential to Achieve Goals: Good Pt will go Supine/Side to Sit: with supervision PT Goal: Supine/Side to Sit - Progress: Progressing toward goal Pt will go Sit to Supine/Side: with supervision PT Goal: Sit to Supine/Side - Progress: Progressing toward goal Pt will go Sit to Stand: with supervision PT Goal: Sit to Stand - Progress: Progressing toward goal Pt will go Stand to Sit: with supervision PT Goal: Stand to Sit - Progress: Progressing toward goal Pt will Ambulate: 51 - 150 feet;with supervision;with rolling walker PT Goal: Ambulate - Progress: Progressing toward goal Pt will Go Up / Down Stairs: 1-2 stairs;with min assist;with least restrictive assistive device PT Goal: Up/Down Stairs - Progress: Goal set today  Visit Information  Last PT Received On: 09/26/12 Assistance Needed: +1    Subjective Data  Subjective: My hip hurts less now than it did before surgery Patient Stated Goal: Get new hip and resume active pain free lifestyle   Cognition  Overall Cognitive Status: Appears within functional limits for tasks assessed/performed Arousal/Alertness: Awake/alert Orientation Level: Appears intact for tasks assessed Behavior During Session: [facility] for tasks performed    Balance     End of Session     GP     Carely Nappier 09/26/2012, 5:27 PM

## 2012-09-27 ENCOUNTER — Encounter (HOSPITAL_COMMUNITY): Payer: Self-pay | Admitting: Orthopedic Surgery

## 2012-09-27 DIAGNOSIS — T8459XA Infection and inflammatory reaction due to other internal joint prosthesis, initial encounter: Secondary | ICD-10-CM

## 2012-09-27 DIAGNOSIS — Z96649 Presence of unspecified artificial hip joint: Secondary | ICD-10-CM

## 2012-09-27 LAB — BASIC METABOLIC PANEL
CO2: 27 mEq/L (ref 19–32)
Calcium: 8.7 mg/dL (ref 8.4–10.5)
Chloride: 102 mEq/L (ref 96–112)
Glucose, Bld: 99 mg/dL (ref 70–99)
Sodium: 134 mEq/L — ABNORMAL LOW (ref 135–145)

## 2012-09-27 LAB — CBC
Hemoglobin: 6.3 g/dL — CL (ref 12.0–15.0)
MCH: 30.1 pg (ref 26.0–34.0)
MCV: 91.4 fL (ref 78.0–100.0)
RBC: 2.09 MIL/uL — ABNORMAL LOW (ref 3.87–5.11)

## 2012-09-27 LAB — C-REACTIVE PROTEIN: CRP: 4.6 mg/dL — ABNORMAL HIGH (ref <0.60)

## 2012-09-27 MED ORDER — ASPIRIN 325 MG PO TBEC: 325.0000 mg | DELAYED_RELEASE_TABLET | Freq: Two times a day (BID) | ORAL | Status: DC

## 2012-09-27 MED ORDER — CEFTRIAXONE SODIUM 2 G IJ SOLR: 2.0000 g | INTRAMUSCULAR | Status: DC

## 2012-09-27 MED ORDER — RIFAMPIN 300 MG PO CAPS: 300.0000 mg | ORAL_CAPSULE | Freq: Two times a day (BID) | ORAL | Status: DC

## 2012-09-27 MED ORDER — POLYETHYLENE GLYCOL 3350 17 G PO PACK: 17.0000 g | PACK | Freq: Every day | ORAL | Status: DC | PRN

## 2012-09-27 MED ORDER — FERROUS SULFATE 325 (65 FE) MG PO TABS: 325.0000 mg | ORAL_TABLET | Freq: Three times a day (TID) | ORAL | Status: DC

## 2012-09-27 MED ORDER — VANCOMYCIN HCL IN DEXTROSE 1-5 GM/200ML-% IV SOLN: 1000.0000 mg | INTRAVENOUS | Status: DC

## 2012-09-27 MED ORDER — DSS 100 MG PO CAPS: 100.0000 mg | ORAL_CAPSULE | Freq: Two times a day (BID) | ORAL | Status: DC

## 2012-09-27 MED ORDER — TRAMADOL HCL 50 MG PO TABS: 50.0000 mg | ORAL_TABLET | Freq: Four times a day (QID) | ORAL | Status: DC | PRN

## 2012-09-27 MED ORDER — ACETAMINOPHEN 325 MG PO TABS: 650.0000 mg | ORAL_TABLET | Freq: Four times a day (QID) | ORAL | Status: DC | PRN

## 2012-09-27 MED ORDER — METHOCARBAMOL 500 MG PO TABS: 500.0000 mg | ORAL_TABLET | Freq: Four times a day (QID) | ORAL | Status: DC | PRN

## 2012-09-27 MED ORDER — HEPARIN SOD (PORK) LOCK FLUSH 100 UNIT/ML IV SOLN
250.0000 [IU] | INTRAVENOUS | Status: AC | PRN
  Administered 2012-09-27: 250 [IU]

## 2012-09-27 NOTE — Progress Notes (Signed)
Physical Therapy Treatment Patient Details Name: [name] MRN: [id number] DOB: 01-27-1941 Today's Date: 09/27/2012 Time: 4098-1191 PT Time Calculation (min): 28 min  PT Assessment / Plan / Recommendation Comments on Treatment Session  Pt denies any dizziness with activity    Follow Up Recommendations  Home health PT     Does the patient have the potential to tolerate intense rehabilitation     Barriers to Discharge        Equipment Recommendations  None recommended by PT    Recommendations for Other Services    Frequency 7X/week   Plan Discharge plan remains appropriate    Precautions / Restrictions Precautions Precautions: Posterior Hip Restrictions Weight Bearing Restrictions: Yes LLE Weight Bearing: Partial weight bearing   Pertinent Vitals/Pain 2/10    Mobility  Transfers Transfers: Sit to Stand;Stand to Sit Sit to Stand: 4: Min guard Stand to Sit: 4: Min guard Details for Transfer Assistance: cues for LE management and use of UEs to self assist Ambulation/Gait Ambulation/Gait Assistance: 4: Min guard;5: Supervision Ambulation Distance (Feet): 111 Feet Assistive device: Rolling walker Ambulation/Gait Assistance Details: min cues for position from RW Gait Pattern: Step-to pattern Stairs: Yes Stairs Assistance: 4: Min assist Stairs Assistance Details (indicate cue type and reason): min cues for sequence and foot/RW placement Stair Management Technique: No rails;Backwards;With walker;Step to pattern Number of Stairs: 1  (twice)    Exercises Total Joint Exercises Ankle Circles/Pumps: AROM;20 reps;Supine;Both Quad Sets: AROM;15 reps;Supine;Both Gluteal Sets: AROM;10 reps;Supine;Both Heel Slides: AAROM;15 reps;Supine;Left Hip ABduction/ADduction: AAROM;15 reps;Supine;Left   PT Diagnosis:    PT Problem List:   PT Treatment Interventions:     PT Goals Acute Rehab PT Goals PT Goal Formulation: With patient Time For Goal Achievement:  10/02/12 Potential to Achieve Goals: Good Pt will go Supine/Side to Sit: with supervision PT Goal: Supine/Side to Sit - Progress: Progressing toward goal Pt will go Sit to Supine/Side: with supervision PT Goal: Sit to Supine/Side - Progress: Progressing toward goal Pt will go Sit to Stand: with supervision PT Goal: Sit to Stand - Progress: Progressing toward goal Pt will go Stand to Sit: with supervision PT Goal: Stand to Sit - Progress: Progressing toward goal Pt will Ambulate: 51 - 150 feet;with supervision;with rolling walker PT Goal: Ambulate - Progress: Progressing toward goal Pt will Go Up / Down Stairs: 1-2 stairs;with min assist;with least restrictive assistive device PT Goal: Up/Down Stairs - Progress: Met  Visit Information  Last PT Received On: 09/27/12 Assistance Needed: +1    Subjective Data  Subjective: I can't wait to get home Patient Stated Goal: Get new hip and resume active pain free lifestyle   Cognition  Overall Cognitive Status: Appears within functional limits for tasks assessed/performed Arousal/Alertness: Awake/alert Orientation Level: Appears intact for tasks assessed Behavior During Session: [facility] for tasks performed    Balance     End of Session PT - End of Session Activity Tolerance: Patient tolerated treatment well Patient left: in chair;with call bell/phone within reach Nurse Communication: Mobility status   GP     [name] 09/27/2012, 12:15 PM

## 2012-09-27 NOTE — Progress Notes (Signed)
CRITICAL VALUE ALERT  Critical value received:  hgb 6.3  Date of notification:  09/27/12  Time of notification:  0545  Critical value read back:yes  Nurse who received alert:  Percell Boston (from another nurse)  MD notified (1st page):  Freddie Breech PA  Time of first page:  0720  MD notified (2nd page):  Time of second page:  Responding MD:  Freddie Breech PA  Time MD responded:  [phone]

## 2012-09-27 NOTE — Progress Notes (Signed)
Patient, RN and Husband reviewed discharge instructions and meds. All questions answered and patient verbalized understanding. Currently: VSS, Dressing dry and intact, TED hose on. Pain level 2/10. Discharged to home with Husband by Nurse Tech via wheelchair. Jeannetta Cerutti Lynder Parents, [place] 09/27/2012 11:40 AM

## 2012-09-27 NOTE — Progress Notes (Signed)
   Subjective: 2 Days Post-Op Procedure(s) (LRB): EXCISIONAL TOTAL HIP ARTHROPLASTY WITH ANTIBIOTIC SPACERS (Left)   Patient reports pain as mild, pain well controlled. Discussed that her H&H was low, however she states that this happens to all the time after surgery. She is not having any symptoms and denies wanting to receive blood. She says that she will take iron and stay well hydrated.  Objective:   VITALS:   Filed Vitals:   09/27/12 0432  BP: 97/50  Pulse: 98  Temp: 99 F (37.2 C)  Resp: 16    Neurovascular intact Dorsiflexion/Plantar flexion intact Incision: dressing C/D/I No cellulitis present Compartment soft  LABS  Basename 09/27/12 0435 09/26/12 0437 09/25/12 2028 09/25/12 1455  HGB 6.3* 7.3* 8.5* --  HCT 19.1* 21.5* 25.0* --  WBC 5.4 6.3 -- 4.2  PLT 297 322 -- 437*     Basename 09/27/12 0435 09/26/12 0437 09/25/12 2028 09/25/12 1455  NA 134* 133* 134* --  K 4.1 4.6 4.0 --  BUN 9 8 -- 14  CREATININE 0.59 0.47* -- 0.47*  GLUCOSE 99 167* 160* --     Assessment/Plan: 2 Days Post-Op Procedure(s) (LRB): EXCISIONAL TOTAL HIP ARTHROPLASTY WITH ANTIBIOTIC SPACERS (Left) Wishes not to receive blood, will take Iron and stay well hydrated Up with therapy Discharge home with home health [facility] for Vancomycin and Rocephin HHRN to monitor and pharmacy to adjust Vancomycin to obtain and maintain trough of 15-20 Draw and fax weekly esr, crp cbc and bmp to Dr. Daiva Eves @ [phone] Follow up with Dr. Daiva Eves as directed Follow up in 2 weeks at [facility] Follow up with OLIN,Shahan Starks D in 2 weeks.  Contact information:  [facility] [address], [address] [place] [address] [phone]       Anastasio Auerbach. Shavawn Stobaugh   PAC  09/27/2012, 9:20 AM

## 2012-09-27 NOTE — Op Note (Signed)
NAMELAKEYIA, DROUIN NO.:  [id number]  MEDICAL RECORD NO.:  [id number]  LOCATION:  1619                         FACILITY:  [facility]  PHYSICIAN:  Madlyn Frankel. Charlann Boxer, M.D.  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  FINDINGS:  The patient was noted to have no obvious large purulent accumulation inside the hip joint space.  There were definitely areas of purulence, however.  She was also noted to have a loose acetabular shell.  PROCEDURE: 1. Resection of left total hip replacement arthroplasty. 2. Excisional debridement using a scalpel of her entire hip wound that     measuring 6 inches long including skin, subcutaneous tissue,     nonviable deep tissues including scar. 3. Placement of antibiotic impregnated arthroplasty spacer, Prostalac. 4. Placement of antibiotic beads.  SURGEON:  Madlyn Frankel. Charlann Boxer, M.D.  ASSISTANT:  Lanney Gins, PA-C.  Note that Mr. Carmon Sails was present for the entirety of the case, management of the operative extremity, correction of vital structures as well as preparation of cemented prosthetic components, general facilitation of the case and primary wound closure.  ANESTHESIA:  General.  SPECIMENS:  I did take two wound cultures at that time; however, she has been on oral antibiotics prior to the surgery, which may not help with information enough.  DRAINS:  One medium Hemovac.  BLOOD LOSS:  About 1 liter.  COMPLICATION:  None apparent.  INDICATION FOR PROCEDURE:  Ms. Stanard is a very pleasant 71 year old female with a history of a previously placed DePuy  ASR hip replacement. 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  At that time, she was noted to have significant effect to her acetabulum including ischial and rami fractures, incision fracture as well as deficient acetabulum related to the component.  An attempted revision surgery was complicated by postoperative infection.  She had a subsequent I and D with attempt to try to salvage her acetabular component based on the acetabular bone stock that was present.  Following IV antibiotic treatment and attempt that suppression antibiotics were stopped and there was recent recurrence of infection based on presentation of increasing pain, increasing sedimentation rate, and C-reactive protein.  I have discussed with Ms. Fazzino the all long big goals and attempts at trying to salvage her joint.  She recently presented with increasing pain in the left hip with decreased function and elevated labs.  Based on this presentation, I felt that we needed to be more definite about her treatment options including now resection arthroplasty.  Risks of recurrence were discussed with future plans to including revision surgery were all reviewed as possibilities as well as the possible planes including sending off for a DePuy model for acetabulum to best assessed whether or not she is a candidate for revision surgery versus potential triflanged-type component.  Consent was obtained for benefit  of pain relief and management of infection.  PROCEDURE IN DETAIL:  The patient was brought to the operative theater. Once adequate anesthesia, preoperative antibiotics were administered, this included vancomycin and Ancef.  She was positioned into the right lateral decubitus position with left side up.  Left lower extremity was then prepped and draped in sterile fashion.  Time-out was performed identifying the patient, planned procedure, and extremity.  At the time, her old  incision was utilized and extended slightly proximal and distal for exposure purposes.  Soft tissue planes were created at the iliotibial band and gluteal fascia.  The gluteus maximus was split for posterior approach, and attention was first directed to initial scar debridement posteriorly.  I encountered two different areas, one that was high in the acetabular shell once it was removed, but one that little bit the more superficial in the joint where two separate cultures were sent, one of synovial fluid and one from an area of purulence identified.  Once I had the posterior two-thirds of the acetabulum exposed, I dislocated the hip and removed the femoral head.  Attention was first directed to removing the femoral component at least the SROM based modular component.  Using a SROM extraction device, I was able to wedge out the modular stem and removed the component.  At this point, we identified some still persistent metal debris into the posterior aspect of the trochanter.  I debrided this out temporarily at this point.  At this point, I was able then to adequately retracted the femur without complicating features anteriorly.  I then focused on the acetabulum. Once the acetabulum was exposed, we were able to identify that it was significantly loose.  At this point, the liner was removed.  This was an old Zimmer tantalum revision cup and so, thia liner was removed without difficulty.  Three cancellous screws were removed and noted to be loose and acetabular shell was removed without significant bony ingrowth.  At this point, I spent the significant amount of time working to debride the acetabulum to identify anatomic landmarks.  I identified that in this hemispherical zone on the left hip at approximately 10 or 11 o'clock.  There was a specula where anterior ilium still intact, the superior ilium was relatively intact, the posterior wall was intact and column and down towards the  pubis anteriorly, inferiorly.  There was some bone to her anterior wall, area was quite deficient on examination. There was no evidence of any transverse acetabular fracture or instability of the acetabulum.  The acetabulum was significantly debrided using curettes predominantly, no reaming carried out.  Once this was done on the back table, we were already preparing the Prostalac cemented stem as well as Stimulan beads for antibiotic infiltration.  While this was being carried out, the second assistant was helped for management of the operative extremity.  I was then able to use thin osteotome and worked around the proximal sleeve of the SROM component and removed the proximal sleeve with no significant bone loss.  At this point, I reamed the femoral component up to 14-mm reamer.  Again, curette debridement of the proximal femur that was also carried out removing any further metallized necrotic bone, nonviable bone or any other issues.  At this point, the canal of the femur was irrigated with 3 liters of normal saline solution with the canal brush irrigator.  The remaining wound was irrigated with 6 liters of normal saline solution.  At this point, the size 1 high-offset Prostalac  stem had been made into cemented mold with two batches of cement, and the inclusion of 3 g of vancomycin and 1.4 g of tobramycin.  We opened up the 48-mm Prostalac liner for cementing.  Once all components had been opened, a single batch of cement was mixed with 1 g of vancomycin and 1.4 g of tobramycin and used to cement the plastic liner into place.  I held it into approximately 40-45 degrees of abduction, 20 degrees of forward flexion. Once the cement had fully cured and cement was debrided, I then mixed the another batch of cement with vancomycin and tobramycin, and cemented in the Prostalac stem to the proximal femur.  This was all done with intention to be able to removed fairly easily at the  following surgery.  Once of the final components were cemented into place, I have trialed and selected a 32+ 8.5 ball to make sure we had a stable hip construct.  Once the final ball was impacted and cleaned the dry trunnion, I placed a 10 mL of Stimulan beads into the joint itself.  I placed a medium Hemovac drain deep.  At this point, the iliotibial band and gluteal fascia were reapproximated over this posterior aspect of the femur using #1 PDS.  The remainder of the wound was closed with 2-0 Vicryl and staples on the skin.  The skin was cleaned, dried and dressed sterilely using Mepilex dressing.  The drain site was dressed separately using dressing, sponges, and Tegaderm.  She was then extubated and brought to the recovery room.  She tolerated the procedure well.  Postoperatively, we will have Infectious Disease involved based on their involvement previously.  We will discuss findings with she and her husband and discussed the postoperative plan from here.     Madlyn Frankel Charlann Boxer, M.D.     MDO/MEDQ  D:  09/27/2012  T:  09/27/2012  Job:  [id number]

## 2012-09-27 NOTE — Progress Notes (Signed)
Discharge summary sent to payer through [facility]  

## 2012-09-27 NOTE — Progress Notes (Signed)
[facility]  Antibiotics: Day # 3 abx Day # 3 vancomycin Day # 2 rocephin Day # 2 rifampin  Subjective: No new complaints   Antibiotics:  Anti-infectives     Start     Dose/Rate Route Frequency Ordered Stop   09/27/12 1000   rifampin (RIFADIN) capsule 300 mg        300 mg Oral Every 12 hours 09/26/12 1502     09/27/12 0000   dextrose 5 % SOLN 50 mL with cefTRIAXone 2 G SOLR 2 g     Comments: Draw and fax weekly esr, crp cbc and bmp to Dr. Daiva Eves @ [phone]      2 g 100 mL/hr over 30 Minutes Intravenous Every 24 hours 09/27/12 0934     09/27/12 0000   vancomycin (VANCOCIN) 1 GM/200ML SOLN     Comments: Weekly blood draws to obtain and maintain trough level between 15-20. Draw and fax weekly esr, crp cbc and bmp to Dr. Daiva Eves @ [phone]      1,000 mg 200 mL/hr over 60 Minutes Intravenous Every 24 hours 09/27/12 0934     09/27/12 0000   rifampin (RIFADIN) 300 MG capsule        300 mg Oral Every 12 hours 09/27/12 0943     09/26/12 1800   vancomycin (VANCOCIN) IVPB 1000 mg/200 mL premix     Comments: Consult pharmacy      1,000 mg 200 mL/hr over 60 Minutes Intravenous Every 24 hours 09/25/12 2306     09/26/12 1600   cefTRIAXone (ROCEPHIN) 2 g in dextrose 5 % 50 mL IVPB        2 g 100 mL/hr over 30 Minutes Intravenous Every 24 hours 09/26/12 1457     09/25/12 2033   vancomycin (VANCOCIN) powder  Status:  Discontinued          As needed 09/25/12 2033 09/25/12 2131   09/25/12 2032   tobramycin (NEBCIN) powder  Status:  Discontinued          As needed 09/25/12 2032 09/25/12 2131   09/25/12 2030   vancomycin (VANCOCIN) powder  Status:  Discontinued          As needed 09/25/12 2030 09/25/12 2131   09/25/12 2021   tobramycin (NEBCIN) powder  Status:  Discontinued          As needed 09/25/12 2029 09/25/12 2131   09/25/12 2011   tobramycin (NEBCIN) powder  Status:  Discontinued          As needed 09/25/12 2011 09/25/12 2131   09/25/12 2011    vancomycin (VANCOCIN) powder  Status:  Discontinued          As needed 09/25/12 2012 09/25/12 2131   09/25/12 2009   vancomycin (VANCOCIN) powder  Status:  Discontinued          As needed 09/25/12 2010 09/25/12 2131   09/25/12 2008   tobramycin (NEBCIN) powder  Status:  Discontinued          As needed 09/25/12 2009 09/25/12 2131   09/25/12 1500   vancomycin (VANCOCIN) IVPB 1000 mg/200 mL premix  Status:  Discontinued        1,000 mg 200 mL/hr over 60 Minutes Intravenous  Once 09/25/12 1419 09/25/12 2235   09/25/12 1500   ceFAZolin (ANCEF) IVPB 2 g/50 mL premix  Status:  Discontinued        2 g 100 mL/hr over 30 Minutes Intravenous  Once 09/25/12 1419 09/25/12 2235   09/25/12 1430   vancomycin (VANCOCIN) powder 7,000 mg  Status:  Discontinued        7,000 mg Other To Surgery 09/25/12 1419 09/25/12 2235   09/25/12 1430   tobramycin (NEBCIN) powder 8.4 g  Status:  Discontinued        8.4 g Topical To Surgery 09/25/12 1419 09/25/12 2235          Medications: Scheduled Meds:   . [COMPLETED] acetaminophen  1,000 mg Intravenous Q6H  . aspirin EC  325 mg Oral BID  . calcium-vitamin D  1 tablet Oral Daily  . cefTRIAXone (ROCEPHIN)  IV  2 g Intravenous Q24H  . docusate sodium  100 mg Oral BID  . estropipate  1.5 mg Oral Daily  . ferrous sulfate  325 mg Oral TID PC  . rifampin  300 mg Oral Q12H  . vancomycin  1,000 mg Intravenous Q24H  . [DISCONTINUED] rivaroxaban  10 mg Oral Q breakfast   Continuous Infusions:   . sodium chloride 0.9 % 1,000 mL with potassium chloride 10 mEq infusion Stopped (09/27/12 1052)   PRN Meds:.acetaminophen, acetaminophen, [COMPLETED] heparin lock flush, HYDROmorphone (DILAUDID) injection, menthol-cetylpyridinium, methocarbamol (ROBAXIN) IV, methocarbamol, metoCLOPramide (REGLAN) injection, metoCLOPramide, ondansetron (ZOFRAN) IV, ondansetron, phenol, polyethylene glycol, sodium chloride, traMADol   Objective: Weight change:   Intake/Output Summary  (Last 24 hours) at 09/27/12 1121 Last data filed at 09/27/12 1000  Gross per 24 hour  Intake   2840 ml  Output   2855 ml  Net    -15 ml   Blood pressure 97/50, pulse 98, temperature 99 F (37.2 C), temperature source Oral, resp. rate 16, height 5\' 1"  (1.549 m), weight 104 lb 2 oz (47.231 kg), SpO2 96.00%. Temp:  [98.3 F (36.8 C)-99 F (37.2 C)] 99 F (37.2 C) (11/14 0432) Pulse Rate:  [89-100] 98  (11/14 0432) Resp:  [16] 16  (11/14 0432) BP: (79-108)/(41-61) 97/50 mmHg (11/14 0432) SpO2:  [96 %-100 %] 96 % (11/14 0432)  Physical Exam: General: Alert and awake, oriented x3, not in any acute distress. HEENT:  EOMI CVS regular rate, normal r,   Chest: , no wheezing, rales or rhonchi Abdomen: soft nontender, nondistended, normal bowel sounds, Extremities: right hip with dressing Skin: no rashes Neuro: nonfocal  Lab Results:  Basename 09/27/12 0435 09/26/12 0437  WBC 5.4 6.3  HGB 6.3* 7.3*  HCT 19.1* 21.5*  PLT 297 322    BMET  Basename 09/27/12 0435 09/26/12 0437  NA 134* 133*  K 4.1 4.6  CL 102 101  CO2 27 27  GLUCOSE 99 167*  BUN 9 8  CREATININE 0.59 0.47*  CALCIUM 8.7 8.0*    Micro Results: Recent Results (from the past 240 hour(s))  SURGICAL PCR SCREEN     Status: Normal   Collection Time   09/25/12  2:19 PM      Component Value Range Status Comment   MRSA, PCR NEGATIVE  NEGATIVE Final    Staphylococcus aureus NEGATIVE  NEGATIVE Final   WOUND CULTURE     Status: Normal (Preliminary result)   Collection Time   09/25/12  6:46 PM      Component Value Range Status Comment   Specimen Description WOUND HIP LEFT   Final    Special Requests NONE   Final    Gram Stain     Final    Value: FEW WBC PRESENT,BOTH PMN AND MONONUCLEAR     NO SQUAMOUS EPITHELIAL CELLS  SEEN     NO ORGANISMS SEEN   Culture NO GROWTH 1 DAY   Final    Report Status PENDING   Incomplete   ANAEROBIC CULTURE     Status: Normal (Preliminary result)   Collection Time   09/25/12  6:46  PM      Component Value Range Status Comment   Specimen Description WOUND HIP LEFT   Final    Special Requests NONE   Final    Gram Stain PENDING   Incomplete    Culture     Final    Value: NO ANAEROBES ISOLATED; CULTURE IN PROGRESS FOR 5 DAYS   Report Status PENDING   Incomplete   WOUND CULTURE     Status: Normal (Preliminary result)   Collection Time   09/25/12  7:21 PM      Component Value Range Status Comment   Specimen Description WOUND LEFT AC   Final    Special Requests NONE   Final    Gram Stain PENDING   Incomplete    Culture NO GROWTH 1 DAY   Final    Report Status PENDING   Incomplete   ANAEROBIC CULTURE     Status: Normal (Preliminary result)   Collection Time   09/25/12  7:21 PM      Component Value Range Status Comment   Specimen Description WOUND LEFT AC   Final    Special Requests NONE   Final    Gram Stain     Final    Value: FEW WBC PRESENT, PREDOMINANTLY MONONUCLEAR     NO SQUAMOUS EPITHELIAL CELLS SEEN     NO ORGANISMS SEEN   Culture     Final    Value: NO ANAEROBES ISOLATED; CULTURE IN PROGRESS FOR 5 DAYS   Report Status PENDING   Incomplete     Studies/Results: Dg Chest 2 View  09/25/2012  *RADIOLOGY REPORT*  Clinical Data: Preop for left knee surgery.  CHEST - 2 VIEW  Comparison: Two-view chest 10/23/2008.  Findings: The heart size is normal.  A granuloma in the left upper lobe is stable.  No focal airspace disease is present.  Mild degenerative changes are noted in the thoracic spine.  IMPRESSION:  1.  No acute cardiopulmonary disease or significant interval change.   Original Report Authenticated By: Marin Roberts, M.D.    Dg Pelvis Portable  09/25/2012  *RADIOLOGY REPORT*  Clinical Data: Postop left hip surgery.  PORTABLE PELVIS  Comparison: 11/11/2011  Findings: Postoperative changes from revision of the left total hip replacement.  Removal of the acetabular component and revision of the femoral component.  Soft tissue drains in place.   Fractures through the left inferior pubic ramus again noted.  Antibiotic beads around the left hip.  IMPRESSION: Revision of the left hip replacement as above.   Original Report Authenticated By: Charlett Nose, M.D.       Assessment/Plan: [name] is a 71 y.o. female with  THA infection sp resection of THA  --would give her 6 weeks of IV vancomycin dosed for trough 15-20 --Rocephin 2 grams IV daily for 6 weeks --Rifampin 300mg  po bid (if she can tolerate this) --tentative STOP DATE is [id number]  --I would like weekly vancomycin troughs faxed to me with cbc, bmp  ---pt should be seen in [facility] in the next 2 weeks to make sure everything is going well with abx   LOS: 2 days   Acey Lav 09/27/2012, 11:21 AM

## 2012-09-27 NOTE — Discharge Summary (Signed)
Physician Discharge Summary  Patient ID: [name] MRN: [id number] DOB/AGE: 1941-01-06 71 y.o.  Admit date: 09/25/2012 Discharge date:  09/27/2012  Procedures:  Procedure(s) (LRB): EXCISIONAL TOTAL HIP ARTHROPLASTY WITH ANTIBIOTIC SPACERS (Left)  Attending Physician:  Dr. 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 She was seen in the clinic and reported increasing pain and difficulty getting around, a difficult time with sitting and getting out of positions as well as laying flat. She has had no fevers, chills, night sweats that she reports at this point. She started on Doxycycline and Cipro. After many weeks of antibiotic use she wanted to stop the antibiotics and see how things were coming along.When she was last seen on the 6th of June, Dr. Charlann Boxer ordered labs. At that time she was noted to have a significant bump in her sed rate to 100 and C-reactive protein up to 4.7. Both were significantly elevated. She was restarted on antibiotics. She comes in today in follow up. She had labwork repeated on the 19th of July. She's had a reduction in the sed rate to a 6 and a C-reactive protein drop to 3.3. She notes that she's doing a little bit better with regards to hip pain. In the office an AP pelvis and an AP and lateral of the left hip showing stable femoral and acetabular components, no evidence of any complicating features. She appeared to have spiculated bone growth into her Zimmer cup at this point.  After reviewing with [name] her current situation as well as reviewing what we discussed in the past at this point we are going to plan to resect her left hip and treat her with a Prostalac  cemented hip from [place], antibiotic beads and IV antibiotics for six weeks. I am going to go ahead and have her get a sedimentation rate and C reactive protein today so we can monitor the progress of treatment in the postoperative period. She was given a prescription for Ultram today. Surgery was scheduled. Questions were encouraged and answered and reviewed with her. Risks, benefits and expectations were discussed with the patient. Patient understand the risks, benefits and expectations and wishes to proceed with surgery.  PCP: Kari Baars, MD   Discharged Condition: good  Hospital Course:  Patient underwent the above stated procedure on 09/25/2012. Patient tolerated the procedure well and brought to the recovery room in good condition and subsequently to the floor.  POD #1 BP: 116/66 ; Pulse: 92 ; Temp: 97.6 F (36.4 C) ; Resp: 16  Pt's foley was removed, as well as the hemovac drain removed. IV was changed to a saline lock. Patient reports pain as mild, pain well controlled. No events throughout the night. Neurovascular intact, dorsiflexion/plantar flexion intact, incision: dressing C/D/I, no cellulitis present and compartment soft.   LABS  Basename  09/26/12 0437   HGB  7.3  HCT  21.5   POD #2  BP: 97/50 ; Pulse: 98 ; Temp: 99 F (37.2 C) ; Resp: 16  Patient reports pain as mild, pain well controlled. Discussed that her H&H was low, however she states that this happens to all the time after surgery. She is  not having any symptoms and denies wanting to receive blood. She says that she will take iron and stay well hydrated. Neurovascular intact, dorsiflexion/plantar flexion intact, incision: dressing C/D/I, no cellulitis present and compartment soft.   LABS  Basename  09/27/12 0435   HGB  6.3  HCT  19.1    Discharge Exam: General appearance: alert, cooperative and no distress Extremities: Homans sign is negative, no sign of DVT, no edema, redness or tenderness in the calves or  thighs and no ulcers, gangrene or trophic changes  Disposition:  Home-Health Care Svc with follow up in 2 weeks   Follow-up Information    Follow up with [name], MD. Schedule an appointment as soon as possible for a visit in 2 weeks.   Contact information:   [address] [place] [address] [phone]          Discharge Orders    Future Appointments: Provider: Department: Dept Phone: Center:   10/30/2012 11:15 AM Gardiner Barefoot, MD [facility] [phone] [facility]     Future Orders Please Complete By Expires   Diet - low sodium heart healthy      Call MD / Call 911      Comments:   If you experience chest pain or shortness of breath, CALL 911 and be transported to the hospital emergency room.  If you develope a fever above 101 F, pus (white drainage) or increased drainage or redness at the wound, or calf pain, call your surgeon's office.   Discharge instructions      Comments:   Daily dressing changes with 4x4 gauze and tape. Keep the area dry and clean until follow up. Follow up in 2 weeks at [facility]. Call with any questions or concerns.  Draw and fax weekly esr, crp cbc and bmp to Dr. Daiva Eves @ [phone]. HHRN to obtain weekly blood draws observe and pharmacy to adjust to obtain and maintain Vancomycin trough levels between 15-20.   Constipation Prevention      Comments:   Drink plenty of fluids.  Prune juice may be helpful.  You may use a stool softener, such as Colace (over the counter) 100 mg twice a day.  Use MiraLax (over the counter) for constipation as needed.   Increase activity slowly as tolerated      Driving restrictions      Comments:   No driving for 4 weeks   Change dressing      Comments:   Daily dressing changes with 4x4 guaze and tape. Keep the area dry and clean.   TED hose      Comments:   Use stockings (TED hose) for 2 weeks on both leg(s).  You may remove them at night for  sleeping.      Current Discharge Medication List    START taking these medications   Details  aspirin EC 325 MG EC tablet Take 1 tablet (325 mg total) by mouth 2 (two) times daily. Qty: 60 tablet, Refills: 0    dextrose 5 % SOLN 50 mL with cefTRIAXone 2 G SOLR 2 g Inject 2 g into the vein daily. Qty: 42 packet, Refills: 0   Comments: Draw and fax weekly esr, crp cbc and bmp to Dr. Daiva Eves @ [phone]    docusate sodium 100 MG CAPS Take 100 mg by mouth 2 (two) times daily. Qty: 10 capsule    ferrous sulfate 325 (65 FE) MG tablet Take 1  tablet (325 mg total) by mouth 3 (three) times daily after meals.    methocarbamol (ROBAXIN) 500 MG tablet Take 1 tablet (500 mg total) by mouth every 6 (six) hours as needed (muscle spasms). Qty: 50 tablet, Refills: 0    polyethylene glycol (MIRALAX / GLYCOLAX) packet Take 17 g by mouth daily as needed. Qty: 14 each    rifampin (RIFADIN) 300 MG capsule Take 1 capsule (300 mg total) by mouth every 12 (twelve) hours. Qty: 84 capsule, Refills: 0    traMADol (ULTRAM) 50 MG tablet Take 1-2 tablets (50-100 mg total) by mouth every 6 (six) hours as needed for pain. Qty: 120 tablet, Refills: 0    vancomycin (VANCOCIN) 1 GM/200ML SOLN Inject 200 mLs (1,000 mg total) into the vein daily. Qty: 8400 mL, Refills: 0   Comments: Weekly blood draws to obtain and maintain trough level between 15-20. Draw and fax weekly esr, crp cbc and bmp to Dr. Daiva Eves @ [phone]      CONTINUE these medications which have CHANGED   Details  acetaminophen (TYLENOL) 325 MG tablet Take 2 tablets (650 mg total) by mouth every 6 (six) hours as needed (or Fever >/= 101).      CONTINUE these medications which have NOT CHANGED   Details  estropipate (OGEN) 1.5 MG tablet Take 1.5 mg by mouth daily.          Signed: Anastasio Auerbach. Benjy Kana   PAC  09/27/2012, 10:07 AM

## 2012-09-30 LAB — ANAEROBIC CULTURE

## 2012-10-24 ENCOUNTER — Telehealth: Payer: Self-pay | Admitting: Infectious Disease

## 2012-10-24 NOTE — Telephone Encounter (Signed)
Patient with decliing WBC and ANC on vanco and ceftriaxone.  Will dc vancomycin  WOuld like to switch to Teflaro 600mg  iv q 12 hours but pt has never had dose of this med and her home infusion co wont do first one in the home.  Tamika can we set up short stay vsiit for [name] to get her first dose of teflaro so her antibiotics can be changed

## 2012-10-25 ENCOUNTER — Other Ambulatory Visit (HOSPITAL_COMMUNITY): Payer: Self-pay | Admitting: *Deleted

## 2012-10-25 NOTE — Telephone Encounter (Signed)
Thanks Tamika and DC the rocephin

## 2012-10-25 NOTE — Telephone Encounter (Signed)
Scheduled for tomorrow at 9:00 am

## 2012-10-26 ENCOUNTER — Telehealth: Payer: Self-pay | Admitting: Licensed Clinical Social Worker

## 2012-10-26 ENCOUNTER — Encounter (HOSPITAL_COMMUNITY): Payer: Medicare Other

## 2012-10-26 DIAGNOSIS — T8459XA Infection and inflammatory reaction due to other internal joint prosthesis, initial encounter: Secondary | ICD-10-CM

## 2012-10-26 DIAGNOSIS — Z96649 Presence of unspecified artificial hip joint: Secondary | ICD-10-CM

## 2012-10-26 MED ORDER — DOXYCYCLINE HYCLATE 100 MG PO TABS: 100.0000 mg | ORAL_TABLET | Freq: Two times a day (BID) | ORAL | Status: DC

## 2012-10-26 NOTE — Telephone Encounter (Signed)
Patient called because her insurance will not cover Teflaro. She was supposed to go to short stay today at 9:00pm but had to cancel because insurance would not pay. I will call [facility] infusion to see if the medication needs a prior approval or peer to peer. Other options of medications are more expensive and would not help the patient financially.

## 2012-10-30 ENCOUNTER — Encounter: Payer: Self-pay | Admitting: Internal Medicine

## 2012-10-30 ENCOUNTER — Ambulatory Visit (INDEPENDENT_AMBULATORY_CARE_PROVIDER_SITE_OTHER): Payer: Medicare Other | Admitting: Internal Medicine

## 2012-10-30 VITALS — BP 126/74 | HR 81 | Temp 97.8°F | Ht 60.0 in | Wt 103.5 lb

## 2012-10-30 DIAGNOSIS — Z96649 Presence of unspecified artificial hip joint: Secondary | ICD-10-CM

## 2012-10-30 DIAGNOSIS — T8459XA Infection and inflammatory reaction due to other internal joint prosthesis, initial encounter: Secondary | ICD-10-CM

## 2012-10-30 DIAGNOSIS — T8450XA Infection and inflammatory reaction due to unspecified internal joint prosthesis, initial encounter: Secondary | ICD-10-CM

## 2012-10-30 NOTE — Assessment & Plan Note (Signed)
She has now completed 5 of 6 weeks of therapy. I will plan on having the PICC line removed on the 24th when she completes her therapy. If there any concerns from yesterday's labs though she will be called otherwise. I will then have her continue the doxycycline for 2 more weeks through about January 6 and stop. She then can remain off antibiotics until surgery as determined by Dr. Charlann Boxer.  She will followup with [place] at that time in mid January to sure that she continues to do well from an infectious disease standpoint.  I did discuss the possibilities including some increase in her inflammatory markers off of therapy that may prompt [place] to continue therapy empirically versus watchful waiting off of antibiotics if labs are reassuring and reimplantation at some point after an observational period if her inflammatory markers remain good or the possibility that a significant amount of inflammation would be found despite being asymptomatic, during the surgery and may require antibiotics at that time.  She voiced her understanding of these possibilities.

## 2012-10-30 NOTE — Patient Instructions (Signed)
Continue doxycycline through January 6th and then stop.

## 2012-10-30 NOTE — Progress Notes (Signed)
  Subjective:    Patient ID: [name], female    DOB: 12/02/1940, 71 y.o.   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 She has had multiple joint replacements in the past and about one year ago developed inflammation and pain in her left hip and got empiric therapy with vancomycin, Cipro and rifampin. She then had continuation therapy with doxycycline and seemed to be doing okay though did have some loosening of her joint and about 5 weeks ago ended up having a resection arthroplasty. This was based on increased inflammation again. She was then started empirically on vancomycin and ceftriaxone. Unfortunately though her white count did decrease and she had a low absolute neutrophil count and so her vancomycin was changed to doxycycline. She is now 5 weeks into her planned six-week course of therapy. She is tolerating the medicine well now. Her stop date is December 24. She actually feels very well and is doing well without the hip and is able to walk and get around. She is pleased with the progress. Her labs done yesterday are not yet available today. She has not been taking rifampin.   Review of Systems  Constitutional: Negative for fever and chills.  Respiratory: Negative for cough and shortness of breath.   Gastrointestinal: Negative for nausea and diarrhea.  Skin: Negative for rash.       Objective:   Physical Exam  Constitutional: She appears well-developed and well-nourished. No distress.  Skin: No rash noted.          Assessment & Plan:

## 2012-11-06 ENCOUNTER — Encounter: Payer: Self-pay | Admitting: Infectious Disease

## 2012-11-22 ENCOUNTER — Encounter: Payer: Self-pay | Admitting: Infectious Disease

## 2012-11-27 ENCOUNTER — Ambulatory Visit: Payer: Medicare Other | Admitting: Infectious Disease

## 2014-01-14 ENCOUNTER — Other Ambulatory Visit: Payer: Self-pay | Admitting: Dermatology

## 2014-02-07 ENCOUNTER — Other Ambulatory Visit: Payer: Self-pay | Admitting: Gastroenterology

## 2014-02-07 DIAGNOSIS — R109 Unspecified abdominal pain: Secondary | ICD-10-CM

## 2014-02-12 ENCOUNTER — Ambulatory Visit
Admission: RE | Admit: 2014-02-12 | Discharge: 2014-02-12 | Disposition: A | Discharge status: Home / Self Care | Payer: Medicare Other | Source: Ambulatory Visit | Attending: Gastroenterology | Admitting: Gastroenterology

## 2014-02-12 DIAGNOSIS — R109 Unspecified abdominal pain: Secondary | ICD-10-CM

## 2014-02-12 MED ORDER — IOHEXOL 300 MG/ML  SOLN
100.0000 mL | Freq: Once | INTRAMUSCULAR | Status: AC | PRN
  Administered 2014-02-12: 100 mL via INTRAVENOUS

## 2014-12-30 ENCOUNTER — Telehealth: Payer: Self-pay | Admitting: Internal Medicine

## 2014-12-30 NOTE — Telephone Encounter (Signed)
Close encounter 

## 2015-02-11 ENCOUNTER — Encounter: Payer: Self-pay | Admitting: *Deleted

## 2015-03-09 ENCOUNTER — Ambulatory Visit (INDEPENDENT_AMBULATORY_CARE_PROVIDER_SITE_OTHER): Payer: Medicare Other | Admitting: Internal Medicine

## 2015-03-09 ENCOUNTER — Encounter: Payer: Self-pay | Admitting: Internal Medicine

## 2015-03-09 VITALS — BP 150/70 | HR 65 | Ht 61.0 in | Wt 113.6 lb

## 2015-03-09 DIAGNOSIS — I270 Primary pulmonary hypertension: Secondary | ICD-10-CM

## 2015-03-09 DIAGNOSIS — Q210 Ventricular septal defect: Secondary | ICD-10-CM

## 2015-03-09 DIAGNOSIS — IMO0001 Reserved for inherently not codable concepts without codable children: Secondary | ICD-10-CM

## 2015-03-09 DIAGNOSIS — I272 Pulmonary hypertension, unspecified: Secondary | ICD-10-CM

## 2015-03-09 DIAGNOSIS — I700 Atherosclerosis of aorta: Secondary | ICD-10-CM

## 2015-03-09 NOTE — Patient Instructions (Signed)
Your physician has requested that you have an echocardiogram. Echocardiography is a painless test that uses sound waves to create images of your heart. It provides your doctor with information about the size and shape of your heart and how well your heart's chambers and valves are working. This procedure takes approximately one hour. There are no restrictions for this procedure.  Your physician wants you to follow-up in: 1 year with Dr. Hilty. You will receive a reminder letter in the mail two months in advance. If you don't receive a letter, please call our office to schedule the follow-up appointment.  

## 2015-03-10 DIAGNOSIS — I272 Pulmonary hypertension, unspecified: Secondary | ICD-10-CM | POA: Insufficient documentation

## 2015-03-10 DIAGNOSIS — Q210 Ventricular septal defect: Secondary | ICD-10-CM | POA: Insufficient documentation

## 2015-03-10 NOTE — Progress Notes (Signed)
OFFICE NOTE  Chief Complaint:  Routine follow-up  Primary Care Physician: Marton Redwood, MD  HPI:  [name] is a pleasant 74 year old female who was previously followed by Dr. Rex Kras. I last saw her in April 2014 for follow-up of a small membranous VSD. She also has very mild pulmonary hypertension. She does have of course a loud murmur which is associated with small VSDs. Her last echo was in 13 and EF was 55% or greater at the time. Normal wall motion was noted. There was mild mitral annular calcification with mild to moderate mitral regurgitation. There was mild tricuspid regurgitation as well. She is overall asymptomatic. She denies chest pain or worsening shortness of breath. She is on limited medications including aspirin, calcium and Myrbetriq.  PMHx:  Past Medical History  Diagnosis Date  . Heart murmur   . Arthritis     Past Surgical History  Procedure Laterality Date  . Abdominal hysterectomy      age 13  . Eye surgery  02/2010    bilateral cataracts  . Appendectomy    . Joint replacement  2006    left hip, revision 2009 and 07/2011  . Joint replacement  10/2007    right hip, revision 12/2008  . Incision and drainage hip  11/11/2011    Procedure: IRRIGATION AND DEBRIDEMENT HIP WITH POLY EXCHANGE;  Surgeon: [name];  Location: [facility];  Service: Orthopedics;  Laterality: Left;  Irrigation and Debridement of Infected Left Total Hip with Exchange Component  . Excisional total hip arthroplasty with antibiotic spacers  09/25/2012    Procedure: EXCISIONAL TOTAL HIP ARTHROPLASTY WITH ANTIBIOTIC SPACERS;  Surgeon: [name], MD;  Location: [facility];  Service: Orthopedics;  Laterality: Left;  RESECTION LEFT TOTAL HIP ARTHROPLASTY AND PLACEMENT OF ANTIBIOTIC SPACERs  . Duplex doppler  2013  . Doppler echocardiography  2013    FAMHx:  Family History  Problem Relation Age of Onset  . Failure to thrive Mother   . Stroke Father     SOCHx:   reports  that she has never smoked. She does not have any smokeless tobacco history on file. She reports that she drinks about 0.6 oz of alcohol per week. She reports that she does not use illicit drugs.  ALLERGIES:  Allergies  Allergen Reactions  . Bactrim Rash    ROS: A comprehensive review of systems was negative.  HOME MEDS: Current Outpatient Prescriptions  Medication Sig Dispense Refill  . aspirin EC 81 MG tablet Take 81 mg by mouth daily.    . Calcium Carbonate-Vitamin D (CALCIUM + D PO) Take by mouth daily.     Marland Kitchen MYRBETRIQ 50 MG TB24 Take 50 mg by mouth daily.      No current facility-administered medications for this visit.    LABS/IMAGING: No results found for this or any previous visit (from the past 48 hour(s)). No results found.  WEIGHTS: Wt Readings from Last 3 Encounters:  03/09/15 113 lb 9.6 oz (51.529 kg)  10/30/12 103 lb 8 oz (46.947 kg)  09/25/12 104 lb 2 oz (47.231 kg)    VITALS: BP 150/70 mmHg  Pulse 65  Ht 5\' 1"  (1.549 m)  Wt 113 lb 9.6 oz (51.529 kg)  BMI 21.48 kg/m2  EXAM: General appearance: alert and no distress Neck: no carotid bruit and no JVD Lungs: clear to auscultation bilaterally Heart: regular rate and rhythm, S1, S2 normal and systolic murmur: early systolic 2/6, crescendo at 2nd right intercostal space  Abdomen: soft, non-tender; bowel sounds normal; no masses,  no organomegaly Extremities: extremities normal, atraumatic, no cyanosis or edema Pulses: 2+ and symmetric Skin: Skin color, texture, turgor normal. No rashes or lesions Neurologic: Grossly normal Psych: Normal  EKG: Normal sinus rhythm at 65  ASSESSMENT: 1. Small perimembranous VSD 2. Mild pulmonary hypertension  PLAN: 1.   [name] has a small membranous VSD without any significant shunting. This is been followed for some time and is fairly stable. Her last echocardiogram was in 2013 therefore would be reasonable to reconsider reevaluation of her pulmonary hypertension.  She does get short of breath if she does a lot of exertion, but this seems to be fairly stable. We'll contact her with the results of her echo but but plan to see her back otherwise in one year.  Pixie Casino, MD, [facility] Attending Cardiologist [facility]  HILTY,Kenneth C 03/10/2015, 4:56 PM

## 2015-03-19 ENCOUNTER — Ambulatory Visit (HOSPITAL_COMMUNITY)
Admission: RE | Admit: 2015-03-19 | Discharge: 2015-03-19 | Disposition: A | Discharge status: Home / Self Care | Payer: Medicare Other | Source: Ambulatory Visit | Attending: Cardiology | Admitting: Cardiology

## 2015-03-19 DIAGNOSIS — Q210 Ventricular septal defect: Secondary | ICD-10-CM

## 2015-03-19 DIAGNOSIS — IMO0001 Reserved for inherently not codable concepts without codable children: Secondary | ICD-10-CM

## 2015-03-19 DIAGNOSIS — I272 Pulmonary hypertension, unspecified: Secondary | ICD-10-CM

## 2015-03-24 ENCOUNTER — Telehealth: Payer: Self-pay | Admitting: *Deleted

## 2015-03-24 NOTE — Telephone Encounter (Signed)
Echo looks fine - small membranous VSD (ventricular septal defect) - this is known and not a factor. Normal chamber sizes. Normal LV function.         Dr. Lemmie Evens    Patient notified of results. She inquired about her EF - the range of 55-60% was provided to patient. She asked for a specific number but no singular number of the EF is available. Informed her per last note, her EF in 2013 on echo was =/>55%

## 2015-03-25 ENCOUNTER — Other Ambulatory Visit: Payer: Self-pay | Admitting: Obstetrics & Gynecology

## 2015-03-26 LAB — CYTOLOGY - PAP

## 2015-05-11 ENCOUNTER — Other Ambulatory Visit: Payer: Self-pay

## 2015-09-16 ENCOUNTER — Encounter (HOSPITAL_COMMUNITY): Payer: Medicare Other

## 2015-09-16 ENCOUNTER — Other Ambulatory Visit (HOSPITAL_COMMUNITY): Payer: Self-pay | Admitting: Internal Medicine

## 2015-09-16 DIAGNOSIS — R0989 Other specified symptoms and signs involving the circulatory and respiratory systems: Secondary | ICD-10-CM

## 2015-09-17 ENCOUNTER — Ambulatory Visit (HOSPITAL_COMMUNITY)
Admission: RE | Admit: 2015-09-17 | Discharge: 2015-09-17 | Disposition: A | Discharge status: Home / Self Care | Payer: Medicare Other | Source: Ambulatory Visit | Attending: Vascular Surgery | Admitting: Vascular Surgery

## 2015-09-17 DIAGNOSIS — I6523 Occlusion and stenosis of bilateral carotid arteries: Secondary | ICD-10-CM | POA: Insufficient documentation

## 2015-09-17 DIAGNOSIS — I779 Disorder of arteries and arterioles, unspecified: Secondary | ICD-10-CM | POA: Insufficient documentation

## 2015-09-17 DIAGNOSIS — R0989 Other specified symptoms and signs involving the circulatory and respiratory systems: Secondary | ICD-10-CM | POA: Insufficient documentation

## 2015-12-28 DIAGNOSIS — M19042 Primary osteoarthritis, left hand: Secondary | ICD-10-CM | POA: Diagnosis not present

## 2015-12-28 DIAGNOSIS — M79641 Pain in right hand: Secondary | ICD-10-CM | POA: Diagnosis not present

## 2015-12-28 DIAGNOSIS — M19041 Primary osteoarthritis, right hand: Secondary | ICD-10-CM | POA: Diagnosis not present

## 2015-12-28 DIAGNOSIS — M65341 Trigger finger, right ring finger: Secondary | ICD-10-CM | POA: Diagnosis not present

## 2016-03-17 DIAGNOSIS — Z96643 Presence of artificial hip joint, bilateral: Secondary | ICD-10-CM | POA: Diagnosis not present

## 2016-03-17 DIAGNOSIS — S39012A Strain of muscle, fascia and tendon of lower back, initial encounter: Secondary | ICD-10-CM | POA: Diagnosis not present

## 2016-03-17 DIAGNOSIS — M4185 Other forms of scoliosis, thoracolumbar region: Secondary | ICD-10-CM | POA: Diagnosis not present

## 2016-03-17 DIAGNOSIS — Z471 Aftercare following joint replacement surgery: Secondary | ICD-10-CM | POA: Diagnosis not present

## 2016-03-21 ENCOUNTER — Ambulatory Visit (INDEPENDENT_AMBULATORY_CARE_PROVIDER_SITE_OTHER): Payer: PPO | Admitting: Internal Medicine

## 2016-03-21 ENCOUNTER — Encounter: Payer: Self-pay | Admitting: Internal Medicine

## 2016-03-21 VITALS — BP 130/68 | HR 68 | Ht 60.0 in | Wt 109.8 lb

## 2016-03-21 DIAGNOSIS — I272 Other secondary pulmonary hypertension: Secondary | ICD-10-CM

## 2016-03-21 DIAGNOSIS — Q210 Ventricular septal defect: Secondary | ICD-10-CM

## 2016-03-21 NOTE — Patient Instructions (Signed)
Your physician wants you to follow-up in: TWO YEARS with Dr. Hilty. You will receive a reminder letter in the mail two months in advance. If you don't receive a letter, please call our office to schedule the follow-up appointment.  

## 2016-03-21 NOTE — Progress Notes (Signed)
OFFICE NOTE  Chief Complaint:  Routine follow-up  Primary Care Physician: Marton Redwood, MD  HPI:  [name] is a pleasant 75 year old female who was previously followed by Dr. Rex Kras. I last saw her in April 2014 for follow-up of a small membranous VSD. She also has very mild pulmonary hypertension. She does have of course a loud murmur which is associated with small VSDs. Her last echo was in 13 and EF was 55% or greater at the time. Normal wall motion was noted. There was mild mitral annular calcification with mild to moderate mitral regurgitation. There was mild tricuspid regurgitation as well. She is overall asymptomatic. She denies chest pain or worsening shortness of breath. She is on limited medications including aspirin, calcium and Myrbetriq.  03/21/2016  [name] returns today for follow-up. She's done extremely well. 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 In the past she's had mild pulmonary hypertension however her echo last year was not able to estimate pulmonary pressures. Her EF is remains stable. Overall she is doing well without any symptoms of shortness of breath or exercise intolerance. She denies any chest pain. She does have lower extremity varicose veins and has a history of vein stripping in the past. She occasionally gets some swelling of her legs.  PMHx:  Past Medical History  Diagnosis Date  . Heart murmur   . Arthritis     Past Surgical History  Procedure Laterality Date  . Abdominal hysterectomy      age 75  . Eye surgery  02/2010    bilateral cataracts  . Appendectomy    . Joint replacement  2006    left hip, revision 2009 and 07/2011  . Joint replacement  10/2007    right hip, revision 12/2008  . Incision and drainage hip  11/11/2011    Procedure: IRRIGATION AND DEBRIDEMENT HIP WITH POLY EXCHANGE;  Surgeon: [name];  Location: [facility];  Service: Orthopedics;  Laterality: Left;  Irrigation and  Debridement of Infected Left Total Hip with Exchange Component  . Excisional total hip arthroplasty with antibiotic spacers  09/25/2012    Procedure: EXCISIONAL TOTAL HIP ARTHROPLASTY WITH ANTIBIOTIC SPACERS;  Surgeon: [name], MD;  Location: [facility];  Service: Orthopedics;  Laterality: Left;  RESECTION LEFT TOTAL HIP ARTHROPLASTY AND PLACEMENT OF ANTIBIOTIC SPACERs  . Duplex doppler  2013  . Doppler echocardiography  2013    FAMHx:  Family History  Problem Relation Age of Onset  . Failure to thrive Mother   . Stroke Father     SOCHx:   reports that she has never smoked. She does not have any smokeless tobacco history on file. She reports that she drinks about 0.6 oz of alcohol per week. She reports that she does not use illicit drugs.  ALLERGIES:  Allergies  Allergen Reactions  . Bactrim Rash    ROS: A comprehensive review of systems was negative.  HOME MEDS: Current Outpatient Prescriptions  Medication Sig Dispense Refill  . aspirin EC 81 MG tablet Take 81 mg by mouth daily.    . Calcium Carbonate-Vitamin D (CALCIUM + D PO) Take by mouth daily.     Marland Kitchen MYRBETRIQ 50 MG TB24 Take 50 mg by mouth daily.      No current facility-administered medications for this visit.    LABS/IMAGING: No results found for this or any previous visit (from the past 48 hour(s)). No results found.  WEIGHTS: Wt Readings  from Last 3 Encounters:  03/21/16 109 lb 12.8 oz (49.805 kg)  03/09/15 113 lb 9.6 oz (51.529 kg)  10/30/12 103 lb 8 oz (46.947 kg)    VITALS: BP 130/68 mmHg  Pulse 68  Ht 5' (1.524 m)  Wt 109 lb 12.8 oz (49.805 kg)  BMI 21.44 kg/m2  EXAM: General appearance: alert and no distress Neck: no carotid bruit and no JVD Lungs: clear to auscultation bilaterally Heart: regular rate and rhythm, S1, S2 normal and systolic murmur: holosystolic 3/6, crescendo at 2nd right intercostal space Abdomen: soft, non-tender; bowel sounds normal; no masses,  no  organomegaly Extremities: extremities normal, atraumatic, no cyanosis or edema and varicose veins noted Pulses: 2+ and symmetric Skin: Skin color, texture, turgor normal. No rashes or lesions Neurologic: Grossly normal Psych: Normal  EKG: Normal sinus rhythm at 68  ASSESSMENT: 1. Small perimembranous VSD 2. Mild pulmonary hypertension 3. Varicose veins  PLAN: 1.   [name] has a small membranous VSD without any significant shunting. This is been followed for some time and is fairly stable. Her last echocardiogram in 2016 shows stable findings. She is clinically asymptomatic. She does have bilateral venous insufficiency with varicose veins and history of vein stripping in the past. She occasionally gets swelling and I mentioned that she may benefit from compression stockings. We gave her brochure for elastic therapy in [place] where she can purchase stockings to use as needed.  Follow-up with me in 2 years or sooner as necessary.  Pixie Casino, MD, [facility] Attending Cardiologist [address] C Hilty 03/21/2016, 10:31 AM

## 2016-03-28 DIAGNOSIS — Z6821 Body mass index (BMI) 21.0-21.9, adult: Secondary | ICD-10-CM | POA: Diagnosis not present

## 2016-03-28 DIAGNOSIS — Z1231 Encounter for screening mammogram for malignant neoplasm of breast: Secondary | ICD-10-CM | POA: Diagnosis not present

## 2016-03-28 DIAGNOSIS — Z01419 Encounter for gynecological examination (general) (routine) without abnormal findings: Secondary | ICD-10-CM | POA: Diagnosis not present

## 2016-04-28 DIAGNOSIS — Z471 Aftercare following joint replacement surgery: Secondary | ICD-10-CM | POA: Diagnosis not present

## 2016-04-28 DIAGNOSIS — S39012A Strain of muscle, fascia and tendon of lower back, initial encounter: Secondary | ICD-10-CM | POA: Diagnosis not present

## 2016-04-28 DIAGNOSIS — Z96641 Presence of right artificial hip joint: Secondary | ICD-10-CM | POA: Diagnosis not present

## 2016-06-15 DIAGNOSIS — M2022 Hallux rigidus, left foot: Secondary | ICD-10-CM | POA: Diagnosis not present

## 2016-06-15 DIAGNOSIS — M205X2 Other deformities of toe(s) (acquired), left foot: Secondary | ICD-10-CM | POA: Diagnosis not present

## 2016-06-15 DIAGNOSIS — M2042 Other hammer toe(s) (acquired), left foot: Secondary | ICD-10-CM | POA: Diagnosis not present

## 2016-07-14 DIAGNOSIS — D126 Benign neoplasm of colon, unspecified: Secondary | ICD-10-CM | POA: Diagnosis not present

## 2016-07-14 DIAGNOSIS — D123 Benign neoplasm of transverse colon: Secondary | ICD-10-CM | POA: Diagnosis not present

## 2016-07-14 DIAGNOSIS — Z1211 Encounter for screening for malignant neoplasm of colon: Secondary | ICD-10-CM | POA: Diagnosis not present

## 2016-07-14 DIAGNOSIS — K5730 Diverticulosis of large intestine without perforation or abscess without bleeding: Secondary | ICD-10-CM | POA: Diagnosis not present

## 2016-07-20 DIAGNOSIS — Z1211 Encounter for screening for malignant neoplasm of colon: Secondary | ICD-10-CM | POA: Diagnosis not present

## 2016-07-20 DIAGNOSIS — D126 Benign neoplasm of colon, unspecified: Secondary | ICD-10-CM | POA: Diagnosis not present

## 2016-08-09 DIAGNOSIS — S52571A Other intraarticular fracture of lower end of right radius, initial encounter for closed fracture: Secondary | ICD-10-CM | POA: Diagnosis not present

## 2016-08-09 DIAGNOSIS — M25531 Pain in right wrist: Secondary | ICD-10-CM | POA: Diagnosis not present

## 2016-08-11 DIAGNOSIS — Y999 Unspecified external cause status: Secondary | ICD-10-CM | POA: Diagnosis not present

## 2016-08-11 DIAGNOSIS — S52571A Other intraarticular fracture of lower end of right radius, initial encounter for closed fracture: Secondary | ICD-10-CM | POA: Diagnosis not present

## 2016-08-11 DIAGNOSIS — G8918 Other acute postprocedural pain: Secondary | ICD-10-CM | POA: Diagnosis not present

## 2016-08-13 DIAGNOSIS — Z23 Encounter for immunization: Secondary | ICD-10-CM | POA: Diagnosis not present

## 2016-08-25 DIAGNOSIS — S52571D Other intraarticular fracture of lower end of right radius, subsequent encounter for closed fracture with routine healing: Secondary | ICD-10-CM | POA: Diagnosis not present

## 2016-09-08 DIAGNOSIS — Z4789 Encounter for other orthopedic aftercare: Secondary | ICD-10-CM | POA: Diagnosis not present

## 2016-09-08 DIAGNOSIS — S52571D Other intraarticular fracture of lower end of right radius, subsequent encounter for closed fracture with routine healing: Secondary | ICD-10-CM | POA: Diagnosis not present

## 2016-09-08 DIAGNOSIS — M25531 Pain in right wrist: Secondary | ICD-10-CM | POA: Diagnosis not present

## 2016-09-12 DIAGNOSIS — M859 Disorder of bone density and structure, unspecified: Secondary | ICD-10-CM | POA: Diagnosis not present

## 2016-09-12 DIAGNOSIS — E781 Pure hyperglyceridemia: Secondary | ICD-10-CM | POA: Diagnosis not present

## 2016-09-12 DIAGNOSIS — R030 Elevated blood-pressure reading, without diagnosis of hypertension: Secondary | ICD-10-CM | POA: Diagnosis not present

## 2016-09-15 DIAGNOSIS — M25531 Pain in right wrist: Secondary | ICD-10-CM | POA: Diagnosis not present

## 2016-09-16 DIAGNOSIS — Z471 Aftercare following joint replacement surgery: Secondary | ICD-10-CM | POA: Diagnosis not present

## 2016-09-16 DIAGNOSIS — Z96643 Presence of artificial hip joint, bilateral: Secondary | ICD-10-CM | POA: Diagnosis not present

## 2016-09-19 DIAGNOSIS — R2689 Other abnormalities of gait and mobility: Secondary | ICD-10-CM | POA: Diagnosis not present

## 2016-09-19 DIAGNOSIS — Z0000 Encounter for general adult medical examination without abnormal findings: Secondary | ICD-10-CM | POA: Diagnosis not present

## 2016-09-19 DIAGNOSIS — Z6821 Body mass index (BMI) 21.0-21.9, adult: Secondary | ICD-10-CM | POA: Diagnosis not present

## 2016-09-19 DIAGNOSIS — Z1212 Encounter for screening for malignant neoplasm of rectum: Secondary | ICD-10-CM | POA: Diagnosis not present

## 2016-09-19 DIAGNOSIS — I519 Heart disease, unspecified: Secondary | ICD-10-CM | POA: Diagnosis not present

## 2016-09-19 DIAGNOSIS — M159 Polyosteoarthritis, unspecified: Secondary | ICD-10-CM | POA: Diagnosis not present

## 2016-09-19 DIAGNOSIS — Z1389 Encounter for screening for other disorder: Secondary | ICD-10-CM | POA: Diagnosis not present

## 2016-09-19 DIAGNOSIS — S62109D Fracture of unspecified carpal bone, unspecified wrist, subsequent encounter for fracture with routine healing: Secondary | ICD-10-CM | POA: Diagnosis not present

## 2016-09-19 DIAGNOSIS — R030 Elevated blood-pressure reading, without diagnosis of hypertension: Secondary | ICD-10-CM | POA: Diagnosis not present

## 2016-09-19 DIAGNOSIS — M8580 Other specified disorders of bone density and structure, unspecified site: Secondary | ICD-10-CM | POA: Diagnosis not present

## 2016-09-19 DIAGNOSIS — N3281 Overactive bladder: Secondary | ICD-10-CM | POA: Diagnosis not present

## 2016-09-19 DIAGNOSIS — Q210 Ventricular septal defect: Secondary | ICD-10-CM | POA: Diagnosis not present

## 2016-09-22 DIAGNOSIS — M25531 Pain in right wrist: Secondary | ICD-10-CM | POA: Diagnosis not present

## 2016-09-27 DIAGNOSIS — N958 Other specified menopausal and perimenopausal disorders: Secondary | ICD-10-CM | POA: Diagnosis not present

## 2016-09-27 DIAGNOSIS — Z13820 Encounter for screening for osteoporosis: Secondary | ICD-10-CM | POA: Diagnosis not present

## 2016-09-27 DIAGNOSIS — M816 Localized osteoporosis [Lequesne]: Secondary | ICD-10-CM | POA: Diagnosis not present

## 2016-10-05 DIAGNOSIS — M25531 Pain in right wrist: Secondary | ICD-10-CM | POA: Diagnosis not present

## 2016-10-05 DIAGNOSIS — Z4789 Encounter for other orthopedic aftercare: Secondary | ICD-10-CM | POA: Diagnosis not present

## 2016-10-18 DIAGNOSIS — M4187 Other forms of scoliosis, lumbosacral region: Secondary | ICD-10-CM | POA: Diagnosis not present

## 2016-10-19 DIAGNOSIS — N3941 Urge incontinence: Secondary | ICD-10-CM | POA: Diagnosis not present

## 2016-10-20 DIAGNOSIS — M4807 Spinal stenosis, lumbosacral region: Secondary | ICD-10-CM | POA: Diagnosis not present

## 2016-10-25 DIAGNOSIS — M4807 Spinal stenosis, lumbosacral region: Secondary | ICD-10-CM | POA: Diagnosis not present

## 2016-10-28 DIAGNOSIS — M4807 Spinal stenosis, lumbosacral region: Secondary | ICD-10-CM | POA: Diagnosis not present

## 2016-11-01 DIAGNOSIS — M4807 Spinal stenosis, lumbosacral region: Secondary | ICD-10-CM | POA: Diagnosis not present

## 2016-11-03 DIAGNOSIS — M4807 Spinal stenosis, lumbosacral region: Secondary | ICD-10-CM | POA: Diagnosis not present

## 2016-11-04 DIAGNOSIS — Z85828 Personal history of other malignant neoplasm of skin: Secondary | ICD-10-CM | POA: Diagnosis not present

## 2016-11-04 DIAGNOSIS — D225 Melanocytic nevi of trunk: Secondary | ICD-10-CM | POA: Diagnosis not present

## 2016-11-04 DIAGNOSIS — L821 Other seborrheic keratosis: Secondary | ICD-10-CM | POA: Diagnosis not present

## 2016-11-04 DIAGNOSIS — L570 Actinic keratosis: Secondary | ICD-10-CM | POA: Diagnosis not present

## 2016-11-04 DIAGNOSIS — L814 Other melanin hyperpigmentation: Secondary | ICD-10-CM | POA: Diagnosis not present

## 2016-11-09 DIAGNOSIS — M4807 Spinal stenosis, lumbosacral region: Secondary | ICD-10-CM | POA: Diagnosis not present

## 2016-12-14 DIAGNOSIS — M4807 Spinal stenosis, lumbosacral region: Secondary | ICD-10-CM | POA: Diagnosis not present

## 2016-12-15 DIAGNOSIS — Z961 Presence of intraocular lens: Secondary | ICD-10-CM | POA: Diagnosis not present

## 2017-02-15 DIAGNOSIS — M4807 Spinal stenosis, lumbosacral region: Secondary | ICD-10-CM | POA: Diagnosis not present

## 2017-02-15 DIAGNOSIS — R29898 Other symptoms and signs involving the musculoskeletal system: Secondary | ICD-10-CM | POA: Diagnosis not present

## 2017-02-16 DIAGNOSIS — C44329 Squamous cell carcinoma of skin of other parts of face: Secondary | ICD-10-CM | POA: Diagnosis not present

## 2017-03-03 DIAGNOSIS — Z6822 Body mass index (BMI) 22.0-22.9, adult: Secondary | ICD-10-CM | POA: Diagnosis not present

## 2017-03-03 DIAGNOSIS — L03116 Cellulitis of left lower limb: Secondary | ICD-10-CM | POA: Diagnosis not present

## 2017-03-29 DIAGNOSIS — Z1231 Encounter for screening mammogram for malignant neoplasm of breast: Secondary | ICD-10-CM | POA: Diagnosis not present

## 2017-03-29 DIAGNOSIS — Z124 Encounter for screening for malignant neoplasm of cervix: Secondary | ICD-10-CM | POA: Diagnosis not present

## 2017-03-29 DIAGNOSIS — Z1272 Encounter for screening for malignant neoplasm of vagina: Secondary | ICD-10-CM | POA: Diagnosis not present

## 2017-03-29 DIAGNOSIS — Z01419 Encounter for gynecological examination (general) (routine) without abnormal findings: Secondary | ICD-10-CM | POA: Diagnosis not present

## 2017-03-29 DIAGNOSIS — Z6822 Body mass index (BMI) 22.0-22.9, adult: Secondary | ICD-10-CM | POA: Diagnosis not present

## 2017-03-30 DIAGNOSIS — L905 Scar conditions and fibrosis of skin: Secondary | ICD-10-CM | POA: Diagnosis not present

## 2017-03-30 DIAGNOSIS — Z85828 Personal history of other malignant neoplasm of skin: Secondary | ICD-10-CM | POA: Diagnosis not present

## 2017-05-03 DIAGNOSIS — M5136 Other intervertebral disc degeneration, lumbar region: Secondary | ICD-10-CM | POA: Diagnosis not present

## 2017-05-03 DIAGNOSIS — M419 Scoliosis, unspecified: Secondary | ICD-10-CM | POA: Diagnosis not present

## 2017-05-03 DIAGNOSIS — M25551 Pain in right hip: Secondary | ICD-10-CM | POA: Diagnosis not present

## 2017-05-03 DIAGNOSIS — Z471 Aftercare following joint replacement surgery: Secondary | ICD-10-CM | POA: Diagnosis not present

## 2017-05-03 DIAGNOSIS — Z96643 Presence of artificial hip joint, bilateral: Secondary | ICD-10-CM | POA: Diagnosis not present

## 2017-05-03 DIAGNOSIS — M25552 Pain in left hip: Secondary | ICD-10-CM | POA: Diagnosis not present

## 2017-07-03 DIAGNOSIS — Z85828 Personal history of other malignant neoplasm of skin: Secondary | ICD-10-CM | POA: Diagnosis not present

## 2017-07-03 DIAGNOSIS — L720 Epidermal cyst: Secondary | ICD-10-CM | POA: Diagnosis not present

## 2017-08-12 DIAGNOSIS — Z23 Encounter for immunization: Secondary | ICD-10-CM | POA: Diagnosis not present

## 2017-08-15 DIAGNOSIS — I872 Venous insufficiency (chronic) (peripheral): Secondary | ICD-10-CM | POA: Diagnosis not present

## 2017-08-15 DIAGNOSIS — D485 Neoplasm of uncertain behavior of skin: Secondary | ICD-10-CM | POA: Diagnosis not present

## 2017-09-18 DIAGNOSIS — M859 Disorder of bone density and structure, unspecified: Secondary | ICD-10-CM | POA: Diagnosis not present

## 2017-09-18 DIAGNOSIS — D6489 Other specified anemias: Secondary | ICD-10-CM | POA: Diagnosis not present

## 2017-09-19 DIAGNOSIS — D649 Anemia, unspecified: Secondary | ICD-10-CM | POA: Diagnosis not present

## 2017-09-21 DIAGNOSIS — Q210 Ventricular septal defect: Secondary | ICD-10-CM | POA: Diagnosis not present

## 2017-09-21 DIAGNOSIS — M8580 Other specified disorders of bone density and structure, unspecified site: Secondary | ICD-10-CM | POA: Diagnosis not present

## 2017-09-21 DIAGNOSIS — Z1389 Encounter for screening for other disorder: Secondary | ICD-10-CM | POA: Diagnosis not present

## 2017-09-21 DIAGNOSIS — R0989 Other specified symptoms and signs involving the circulatory and respiratory systems: Secondary | ICD-10-CM | POA: Diagnosis not present

## 2017-09-21 DIAGNOSIS — D6489 Other specified anemias: Secondary | ICD-10-CM | POA: Diagnosis not present

## 2017-09-21 DIAGNOSIS — R011 Cardiac murmur, unspecified: Secondary | ICD-10-CM | POA: Diagnosis not present

## 2017-09-21 DIAGNOSIS — I519 Heart disease, unspecified: Secondary | ICD-10-CM | POA: Diagnosis not present

## 2017-09-21 DIAGNOSIS — Z6822 Body mass index (BMI) 22.0-22.9, adult: Secondary | ICD-10-CM | POA: Diagnosis not present

## 2017-09-21 DIAGNOSIS — Z0000 Encounter for general adult medical examination without abnormal findings: Secondary | ICD-10-CM | POA: Diagnosis not present

## 2017-09-21 DIAGNOSIS — N3281 Overactive bladder: Secondary | ICD-10-CM | POA: Diagnosis not present

## 2017-09-21 DIAGNOSIS — E781 Pure hyperglyceridemia: Secondary | ICD-10-CM | POA: Diagnosis not present

## 2017-09-21 DIAGNOSIS — R030 Elevated blood-pressure reading, without diagnosis of hypertension: Secondary | ICD-10-CM | POA: Diagnosis not present

## 2017-09-25 DIAGNOSIS — Z96642 Presence of left artificial hip joint: Secondary | ICD-10-CM | POA: Diagnosis not present

## 2017-09-25 DIAGNOSIS — Z471 Aftercare following joint replacement surgery: Secondary | ICD-10-CM | POA: Diagnosis not present

## 2017-10-09 DIAGNOSIS — Z1212 Encounter for screening for malignant neoplasm of rectum: Secondary | ICD-10-CM | POA: Diagnosis not present

## 2017-10-11 ENCOUNTER — Other Ambulatory Visit (HOSPITAL_COMMUNITY): Payer: Self-pay | Admitting: Orthopedic Surgery

## 2017-10-11 DIAGNOSIS — Z471 Aftercare following joint replacement surgery: Secondary | ICD-10-CM

## 2017-10-11 DIAGNOSIS — Z96642 Presence of left artificial hip joint: Principal | ICD-10-CM

## 2017-10-17 ENCOUNTER — Encounter (HOSPITAL_COMMUNITY): Payer: Self-pay

## 2017-10-17 ENCOUNTER — Encounter (HOSPITAL_COMMUNITY)
Admission: RE | Admit: 2017-10-17 | Discharge: 2017-10-17 | Disposition: A | Discharge status: Home / Self Care | Payer: PPO | Source: Ambulatory Visit | Attending: Orthopedic Surgery | Admitting: Orthopedic Surgery

## 2017-10-17 DIAGNOSIS — Z96642 Presence of left artificial hip joint: Secondary | ICD-10-CM | POA: Insufficient documentation

## 2017-10-17 DIAGNOSIS — Z471 Aftercare following joint replacement surgery: Secondary | ICD-10-CM | POA: Diagnosis not present

## 2017-10-17 MED ORDER — TECHNETIUM TC 99M MEDRONATE IV KIT
20.5000 | PACK | Freq: Once | INTRAVENOUS | Status: AC | PRN
  Administered 2017-10-17: 20.5 via INTRAVENOUS

## 2017-10-27 DIAGNOSIS — N3281 Overactive bladder: Secondary | ICD-10-CM | POA: Diagnosis not present

## 2017-11-22 DIAGNOSIS — D6489 Other specified anemias: Secondary | ICD-10-CM | POA: Diagnosis not present

## 2017-12-18 DIAGNOSIS — H524 Presbyopia: Secondary | ICD-10-CM | POA: Diagnosis not present

## 2017-12-18 DIAGNOSIS — H5213 Myopia, bilateral: Secondary | ICD-10-CM | POA: Diagnosis not present

## 2017-12-18 DIAGNOSIS — Z961 Presence of intraocular lens: Secondary | ICD-10-CM | POA: Diagnosis not present

## 2017-12-18 DIAGNOSIS — H52203 Unspecified astigmatism, bilateral: Secondary | ICD-10-CM | POA: Diagnosis not present

## 2018-02-05 DIAGNOSIS — L570 Actinic keratosis: Secondary | ICD-10-CM | POA: Diagnosis not present

## 2018-02-05 DIAGNOSIS — D1801 Hemangioma of skin and subcutaneous tissue: Secondary | ICD-10-CM | POA: Diagnosis not present

## 2018-02-05 DIAGNOSIS — L821 Other seborrheic keratosis: Secondary | ICD-10-CM | POA: Diagnosis not present

## 2018-02-05 DIAGNOSIS — Z85828 Personal history of other malignant neoplasm of skin: Secondary | ICD-10-CM | POA: Diagnosis not present

## 2018-02-05 DIAGNOSIS — L812 Freckles: Secondary | ICD-10-CM | POA: Diagnosis not present

## 2018-02-12 DIAGNOSIS — Z973 Presence of spectacles and contact lenses: Secondary | ICD-10-CM | POA: Diagnosis not present

## 2018-03-13 ENCOUNTER — Ambulatory Visit: Payer: PPO | Admitting: Internal Medicine

## 2018-03-17 IMAGING — NM NM BONE 3 PHASE
10 series · 20 of 20 positions shown · non-contrast
Comparison: None

CLINICAL DATA: Aftercare following LEFT hip joint replacement
surgery, LEFT hip pain, prior LEFT hip arthroplasty 7336 revised in
4804, in 5600 had excision of LEFT hip hardware with placement of
antibiotic spacers, prior RIGHT hip arthroplasty 9262

EXAM:
NUCLEAR MEDICINE 3-PHASE BONE SCAN
TECHNIQUE: Radionuclide angiographic images, immediate static blood pool
images, and 3-hour delayed static images were obtained of the after
intravenous injection of radiopharmaceutical.
RADIOPHARMACEUTICALS:  20.5 mCi Uc-HHm MDP IV

[Series 1: flow · 2.07mm/px · 6 of 48 frames shown (1 of 2)]
[frame 5/48  full-range]
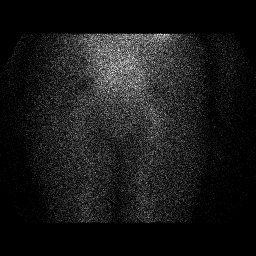
[frame 13/48  full-range]
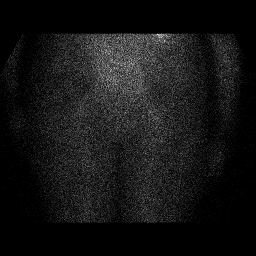
[frame 21/48  full-range]
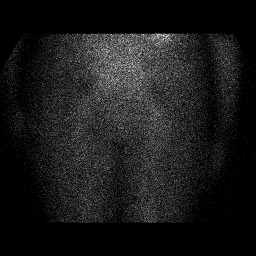
[frame 29/48  full-range]
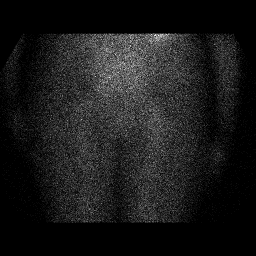
[frame 37/48  full-range]
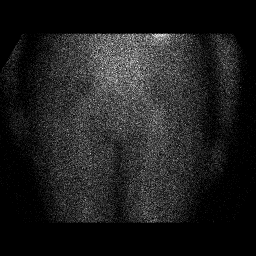
[frame 45/48  full-range]
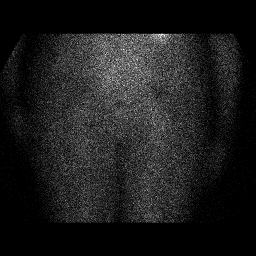

[Series 1: flow · 2.07mm/px · 6 of 48 frames shown (2 of 2)]
[frame 5/48  full-range]
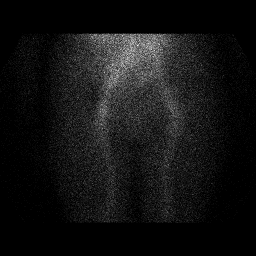
[frame 13/48  full-range]
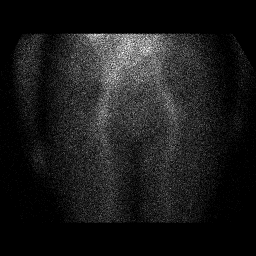
[frame 21/48  full-range]
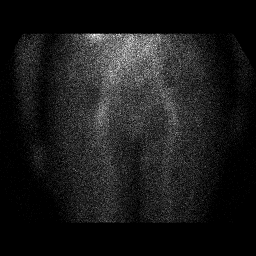
[frame 29/48  full-range]
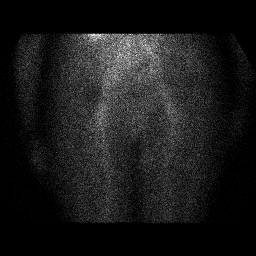
[frame 37/48  full-range]
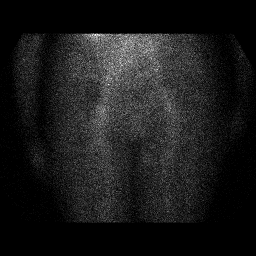
[frame 45/48  full-range]
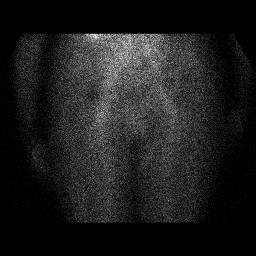

[Series 2: blood pool · 2.07mm/px · 1 of 1 slices shown (1 of 6)]
[im 1/1]
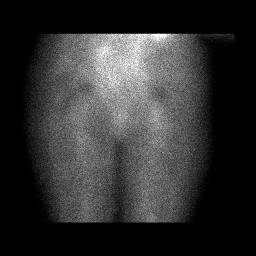

[Series 2: blood pool · 2.07mm/px · 1 of 1 slices shown (2 of 6)]
[im 1/1]
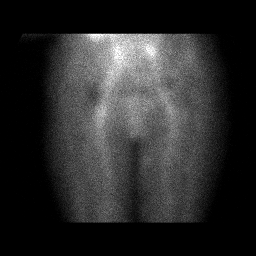

[Series 3: lat bp · 2.07mm/px · 1 of 1 slices shown (1 of 2)]
[im 1/1]
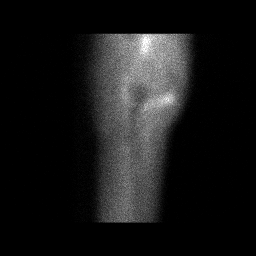

[Series 3: lat bp · 2.07mm/px · 1 of 1 slices shown (2 of 2)]
[im 1/1]
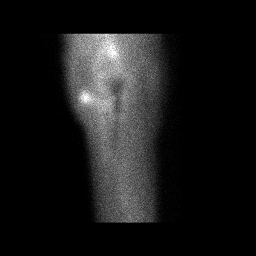

[Series 4: blood pool · 2.07mm/px · 1 of 1 slices shown (3 of 6)]
[im 1/1]
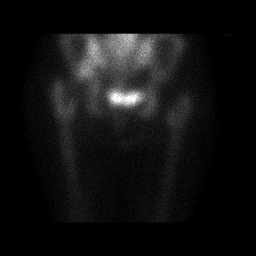

[Series 4: blood pool · 2.07mm/px · 1 of 1 slices shown (4 of 6)]
[im 1/1]
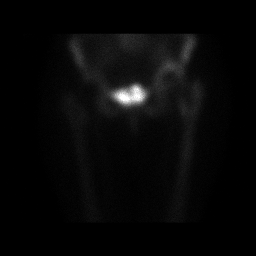

[Series 5: blood pool · 2.07mm/px · 1 of 1 slices shown (5 of 6)]
[im 1/1]
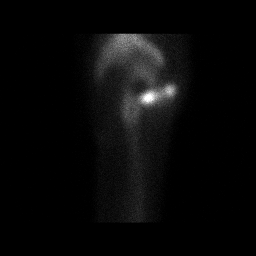

[Series 5: blood pool · 2.07mm/px · 1 of 1 slices shown (6 of 6)]
[im 1/1]
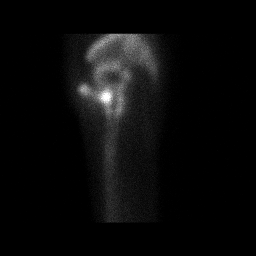

[20 of 20 positions shown; findings below may reference images not displayed]

Radiographic correlation: 08/09/2011, 09/25/2012, lumbar spine
radiographs 10/18/2016
FINDINGS: Vascular phase: Symmetric normal blood flow to both hips

Blood pool phase: Symmetric normal blood pool at both hips

Delayed phase: Photopenic defects at both hips from prior hip
arthroplasty. Increased tracer localization at the LEFT acetabulum;
this corresponds to antibiotic spacers/cement at the LEFT acetabulum
with acetabulum protrusio and mild surrounding sclerosis on prior
lumbar spine plain films. Minimal diffuse increased tracer
localization adjacent to the proximal portion of the femoral
component of the LEFT hip prosthesis. No abnormal tracer
localization identified adjacent to the RIGHT hip prosthesis or
elsewhere in pelvis.
IMPRESSION: Mild delayed uptake of tracer at the LEFT acetabulum corresponding
to antibiotic spacer with surrounding chronic osseous changes and
acetabular protrusio at the LEFT hip.

Observed uptake may be related to prior surgery and secondary
chronic reactive changes potentially related to the antibiotic
spacer but unable to exclude infection.

Recommend correlation with dedicated current radiographs.

## 2018-04-02 DIAGNOSIS — Z1231 Encounter for screening mammogram for malignant neoplasm of breast: Secondary | ICD-10-CM | POA: Diagnosis not present

## 2018-04-05 DIAGNOSIS — Z01419 Encounter for gynecological examination (general) (routine) without abnormal findings: Secondary | ICD-10-CM | POA: Diagnosis not present

## 2018-04-05 DIAGNOSIS — Z6822 Body mass index (BMI) 22.0-22.9, adult: Secondary | ICD-10-CM | POA: Diagnosis not present

## 2018-04-11 ENCOUNTER — Ambulatory Visit (INDEPENDENT_AMBULATORY_CARE_PROVIDER_SITE_OTHER): Payer: PPO | Admitting: Internal Medicine

## 2018-04-11 ENCOUNTER — Encounter: Payer: Self-pay | Admitting: Internal Medicine

## 2018-04-11 VITALS — BP 142/68 | HR 87 | Ht 59.0 in | Wt 107.8 lb

## 2018-04-11 DIAGNOSIS — Q210 Ventricular septal defect: Secondary | ICD-10-CM

## 2018-04-11 DIAGNOSIS — I8393 Asymptomatic varicose veins of bilateral lower extremities: Secondary | ICD-10-CM

## 2018-04-11 DIAGNOSIS — I10 Essential (primary) hypertension: Secondary | ICD-10-CM | POA: Diagnosis not present

## 2018-04-11 MED ORDER — LISINOPRIL 10 MG PO TABS: 10.0000 mg | ORAL_TABLET | Freq: Every day | ORAL | 3 refills | Status: DC

## 2018-04-11 NOTE — Progress Notes (Signed)
OFFICE NOTE  Chief Complaint:  2-year follow-up  Primary Care Physician: Marton Redwood, MD  HPI:  [name] is a pleasant 77 year old female who was previously followed by Dr. Rex Kras. I last saw her in April 2014 for follow-up of a small membranous VSD. She also has very mild pulmonary hypertension. She does have of course a loud murmur which is associated with small VSDs. Her last echo was in 13 and EF was 55% or greater at the time. Normal wall motion was noted. There was mild mitral annular calcification with mild to moderate mitral regurgitation. There was mild tricuspid regurgitation as well. She is overall asymptomatic. She denies chest pain or worsening shortness of breath. She is on limited medications including aspirin, calcium and Myrbetriq.  03/21/2016  [name] returns today for follow-up. She's done extremely well. 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 In the past she's had mild pulmonary hypertension however her echo last year was not able to estimate pulmonary pressures. Her EF is remains stable. Overall she is doing well without any symptoms of shortness of breath or exercise intolerance. She denies any chest pain. She does have lower extremity varicose veins and has a history of vein stripping in the past. She occasionally gets some swelling of her legs.  04/11/2018  [name] was seen today in follow-up.'s been 2 years since I last saw her.  She has a stable small membranous VSD.  EF was unchanged by echo in 2017.  She denies worsening shortness of breath or chest pain or any change in exercise capacity.  She still struggles with varicose veins.  She also has some toe deformity on the left foot and is contemplating surgery.  She rarely gets swelling of her legs.  Interestingly her blood pressure has been elevated.  It was notably high in her PCPs office in December at 181/90.  She also saw her GYN who noted her blood pressure was  elevated.  In addition her blood pressure is elevated here.  She has a list of blood pressure she took at home which I reviewed.  While there are occasional blood pressures in the low 100s in general they are between 160-109 systolic.  Overall, I feel that she may be developing some hypertension, possibly due to arterial sclerosis or aging.  Her PCP had considered treating her blood pressure.  I think it is time for her to be treated.  Labs reviewed from November 2018 showed total cholesterol 205, HDL 79, LDL 114 and triglycerides 60.  PMHx:  Past Medical History:  Diagnosis Date  . Arthritis   . Heart murmur     Past Surgical History:  Procedure Laterality Date  . ABDOMINAL HYSTERECTOMY     age 9  . APPENDECTOMY    . DOPPLER ECHOCARDIOGRAPHY  2013  . Duplex Doppler  2013  . EXCISIONAL TOTAL HIP ARTHROPLASTY WITH ANTIBIOTIC SPACERS  09/25/2012   Procedure: EXCISIONAL TOTAL HIP ARTHROPLASTY WITH ANTIBIOTIC SPACERS;  Surgeon: [name], MD;  Location: [facility];  Service: Orthopedics;  Laterality: Left;  RESECTION LEFT TOTAL HIP ARTHROPLASTY AND PLACEMENT OF ANTIBIOTIC SPACERs  . EYE SURGERY  02/2010   bilateral cataracts  . 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  JOINT REPLACEMENT  2006   left hip, revision 2009 and 07/2011  . JOINT REPLACEMENT  10/2007   right hip, revision 12/2008    FAMHx:  Family History  Problem Relation Age of Onset  . Failure to thrive Mother   . Stroke Father     SOCHx:   reports that she has never smoked. She does not have any smokeless tobacco history on file. She reports that she drinks about 0.6 oz of alcohol per week. She reports that she does not use drugs.  ALLERGIES:  Allergies  Allergen Reactions  . Bactrim Rash      ROS: Pertinent items noted in HPI and remainder of comprehensive ROS otherwise negative.  HOME MEDS: Current Outpatient Medications  Medication Sig Dispense Refill  . Calcium Carbonate-Vitamin D (CALCIUM + D PO) Take by mouth daily.     Marland Kitchen MYRBETRIQ 50 MG TB24 Take 50 mg by mouth daily.      No current facility-administered medications for this visit.     LABS/IMAGING: No results found for this or any previous visit (from the past 48 hour(s)). No results found.  WEIGHTS: Wt Readings from Last 3 Encounters:  04/11/18 107 lb 12.8 oz (48.9 kg)  03/21/16 109 lb 12.8 oz (49.8 kg)  03/09/15 113 lb 9.6 oz (51.5 kg)    VITALS: BP (!) 142/68   Pulse 87   Ht 4\' 11"  (1.499 m)   Wt 107 lb 12.8 oz (48.9 kg)   BMI 21.77 kg/m   EXAM: General appearance: alert and no distress Neck: no carotid bruit and no JVD Lungs: clear to auscultation bilaterally Heart: regular rate and rhythm, S1, S2 normal and systolic murmur: holosystolic 3/6, crescendo at 2nd right intercostal space Abdomen: soft, non-tender; bowel sounds normal; no masses,  no organomegaly Extremities: extremities normal, atraumatic, no cyanosis or edema and varicose veins noted Pulses: 2+ and symmetric Skin: Skin color, texture, turgor normal. No rashes or lesions Neurologic: Grossly normal Psych: Normal  EKG: Normal sinus rhythm at 87-personally reviewed  ASSESSMENT: 1. Essential hypertension 2. Small perimembranous VSD 3. Mild pulmonary hypertension 4. Varicose veins  PLAN: 1.   [name] has systemic hypertension and by home blood pressure readings and elevated office readings in 3 different locations.  This is recently crept up and she is aware of it.  Her PCP is actually consider treatment.  I would agree with this and feel that we should start at least low-dose therapy.  Given the recent recalls of a lot of the ARB medications, I would prefer to start her on ACE inhibitor.  Will start lisinopril 10 mg daily.   She was counseled on the 15% chance of cough and less than half a percent chance of developing angioedema.  I would recommend avoiding calcium channel blocker due to her venous insufficiency and increased risk of swelling.  We will check a repeat metabolic profile in a week to assess renal function and potassium.  Plan follow-up with me annually or sooner as necessary and with her PCP later this year.  Pixie Casino, MD, [facility], [place] Director of the Advanced Lipid Disorders &  Cardiovascular Risk Reduction Clinic Diplomate of the American Board of Clinical Lipidology Attending Cardiologist  Direct Dial: [phone]  Fax: [phone]  Website:  [email or web address] 04/11/2018, 9:21 AM

## 2018-04-11 NOTE — Patient Instructions (Addendum)
Medication Instructions:   Start lisinopril 10mg  daily  Labwork:  Non-fasting lab work Artist) - one week after starting medication   Testing/Procedures:  NONE  Follow-Up:  Your physician wants you to follow-up in: ONE YEAR with Dr. Debara Pickett. You will receive a reminder letter in the mail two months in advance. If you don't receive a letter, please call our office to schedule the follow-up appointment.   If you need a refill on your cardiac medications before your next appointment, please call your pharmacy.  Any Other Special Instructions Will Be Listed Below (If Applicable).

## 2018-04-20 DIAGNOSIS — I10 Essential (primary) hypertension: Secondary | ICD-10-CM | POA: Diagnosis not present

## 2018-04-21 LAB — BASIC METABOLIC PANEL
BUN / CREAT RATIO: 32 — AB (ref 12–28)
BUN: 22 mg/dL (ref 8–27)
CALCIUM: 9.5 mg/dL (ref 8.7–10.3)
CO2: 22 mmol/L (ref 20–29)
Chloride: 102 mmol/L (ref 96–106)
Creatinine, Ser: 0.68 mg/dL (ref 0.57–1.00)
GFR calc non Af Amer: 85 mL/min/{1.73_m2} (ref >59)
GFR, EST AFRICAN AMERICAN: 98 mL/min/{1.73_m2} (ref >59)
Glucose: 90 mg/dL (ref 65–99)
POTASSIUM: 4.5 mmol/L (ref 3.5–5.2)
Sodium: 140 mmol/L (ref 134–144)

## 2018-04-25 ENCOUNTER — Ambulatory Visit: Payer: PPO | Admitting: Podiatry

## 2018-05-14 ENCOUNTER — Ambulatory Visit (INDEPENDENT_AMBULATORY_CARE_PROVIDER_SITE_OTHER): Payer: PPO | Admitting: Podiatry

## 2018-05-14 ENCOUNTER — Ambulatory Visit (INDEPENDENT_AMBULATORY_CARE_PROVIDER_SITE_OTHER): Payer: PPO

## 2018-05-14 ENCOUNTER — Encounter: Payer: Self-pay | Admitting: Podiatry

## 2018-05-14 ENCOUNTER — Other Ambulatory Visit: Payer: Self-pay | Admitting: Podiatry

## 2018-05-14 VITALS — BP 133/72 | HR 67

## 2018-05-14 DIAGNOSIS — M779 Enthesopathy, unspecified: Secondary | ICD-10-CM

## 2018-05-14 DIAGNOSIS — L84 Corns and callosities: Secondary | ICD-10-CM

## 2018-05-14 DIAGNOSIS — M79671 Pain in right foot: Secondary | ICD-10-CM | POA: Diagnosis not present

## 2018-05-14 DIAGNOSIS — M2042 Other hammer toe(s) (acquired), left foot: Secondary | ICD-10-CM

## 2018-05-14 DIAGNOSIS — M79672 Pain in left foot: Secondary | ICD-10-CM

## 2018-05-14 DIAGNOSIS — M21619 Bunion of unspecified foot: Secondary | ICD-10-CM

## 2018-05-15 NOTE — Progress Notes (Signed)
Subjective:   Patient ID: [name], female   DOB: 77 y.o.   MRN: [id number]   HPI Patient presents stating that got severe deformity of my left second toe with lesion formation and lesions underneath both my feet which gets sore and I had foot surgery right foot about 20 years ago and I still have some problems with that.  Patient does not smoke and likes to be active   Review of Systems  All other systems reviewed and are negative.       Objective:  Physical Exam  Constitutional: She appears well-developed and well-nourished.  Cardiovascular: Intact distal pulses.  Pulmonary/Chest: Effort normal.  Musculoskeletal: Normal range of motion.  Neurological: She is alert.  Skin: Skin is warm.  Nursing note and vitals reviewed.   Neurovascular status intact muscle strength is adequate range of motion within normal limits with patient found to have keratotic lesions sub-metatarsals bilateral that are painful when palpated and severe elevation second digit left that is nonfunctional and is not contacting the surface with dorsal keratotic lesion that is painful.  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  I have recommended a very soft type orthotic to try to take pressure off the plantar lesions bilateral and I am referring to ped orthotist so they can be done specifically.  Patient also had discussion about correction of the foot and I would recommend digital amputation digit to left over reconstruction due to the severe nature of her deformity  X-rays indicate severe elevation second digit left foot with rigid contracture structural  bunion deformity left over right

## 2018-05-31 ENCOUNTER — Other Ambulatory Visit: Payer: PPO | Admitting: Orthotics

## 2018-06-08 DIAGNOSIS — M160 Bilateral primary osteoarthritis of hip: Secondary | ICD-10-CM | POA: Diagnosis not present

## 2018-06-08 DIAGNOSIS — Z471 Aftercare following joint replacement surgery: Secondary | ICD-10-CM | POA: Diagnosis not present

## 2018-06-08 DIAGNOSIS — Z96641 Presence of right artificial hip joint: Secondary | ICD-10-CM | POA: Diagnosis not present

## 2018-06-08 DIAGNOSIS — Z96642 Presence of left artificial hip joint: Secondary | ICD-10-CM | POA: Diagnosis not present

## 2018-07-02 ENCOUNTER — Telehealth: Payer: Self-pay | Admitting: Internal Medicine

## 2018-07-02 NOTE — Telephone Encounter (Signed)
Returned call to patient.She stated she had 3 episodes yesterday while talking her words became mixed up.No slurred speech,no dizziness.Stated this happened after being outside in heat.Stated she was not outside long.Stated she feels good this morning, no episodes.B/P good.Stated she is concerned and would like to see Dr.Hilty this afternoon.She has a appointment with him tomorrow,but would like to see him today if possible.Advised his schedule is full today,but I will send message to him.

## 2018-07-02 NOTE — Telephone Encounter (Signed)
Cannot see until tomorrow, if symptoms persist direct to the ER.  DR. Lemmie Evens

## 2018-07-02 NOTE — Telephone Encounter (Signed)
Returned call to patient Dr.Hilty's advice given.

## 2018-07-02 NOTE — Telephone Encounter (Signed)
New Message:   Yesterday patient was outside in the heat not for long, patient stated she was talking and some of her words didn't sound wright Three mouths ago patient started taking Lisinopril 10 mg once a day.

## 2018-07-03 ENCOUNTER — Encounter: Payer: Self-pay | Admitting: Internal Medicine

## 2018-07-03 ENCOUNTER — Ambulatory Visit: Payer: PPO | Admitting: Internal Medicine

## 2018-07-03 VITALS — BP 136/60 | HR 75 | Ht 60.0 in | Wt 104.8 lb

## 2018-07-03 DIAGNOSIS — G459 Transient cerebral ischemic attack, unspecified: Secondary | ICD-10-CM

## 2018-07-03 DIAGNOSIS — I779 Disorder of arteries and arterioles, unspecified: Secondary | ICD-10-CM | POA: Diagnosis not present

## 2018-07-03 DIAGNOSIS — I10 Essential (primary) hypertension: Secondary | ICD-10-CM | POA: Diagnosis not present

## 2018-07-03 DIAGNOSIS — Q210 Ventricular septal defect: Secondary | ICD-10-CM | POA: Diagnosis not present

## 2018-07-03 DIAGNOSIS — I739 Peripheral vascular disease, unspecified: Secondary | ICD-10-CM

## 2018-07-03 NOTE — Progress Notes (Addendum)
OFFICE NOTE  Chief Complaint:  Speech difficulty  Primary Care Physician: Marton Redwood, MD  HPI:  [name] is a pleasant 77 year old female who was previously followed by Dr. Rex Kras. I last saw her in April 2014 for follow-up of a small membranous VSD. She also has very mild pulmonary hypertension. She does have of course a loud murmur which is associated with small VSDs. Her last echo was in 13 and EF was 55% or greater at the time. Normal wall motion was noted. There was mild mitral annular calcification with mild to moderate mitral regurgitation. There was mild tricuspid regurgitation as well. She is overall asymptomatic. She denies chest pain or worsening shortness of breath. She is on limited medications including aspirin, calcium and Myrbetriq.  03/21/2016  [name] returns today for follow-up. She's done extremely well. 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 In the past she's had mild pulmonary hypertension however her echo last year was not able to estimate pulmonary pressures. Her EF is remains stable. Overall she is doing well without any symptoms of shortness of breath or exercise intolerance. She denies any chest pain. She does have lower extremity varicose veins and has a history of vein stripping in the past. She occasionally gets some swelling of her legs.  04/11/2018  [name] was seen today in follow-up.'s been 2 years since I last saw her.  She has a stable small membranous VSD.  EF was unchanged by echo in 2017.  She denies worsening shortness of breath or chest pain or any change in exercise capacity.  She still struggles with varicose veins.  She also has some toe deformity on the left foot and is contemplating surgery.  She rarely gets swelling of her legs.  Interestingly her blood pressure has been elevated.  It was notably high in her PCPs office in December at 181/90.  She also saw her GYN who noted her blood pressure was  elevated.  In addition her blood pressure is elevated here.  She has a list of blood pressure she took at home which I reviewed.  While there are occasional blood pressures in the low 100s in general they are between 706-237 systolic.  Overall, I feel that she may be developing some hypertension, possibly due to arterial sclerosis or aging.  Her PCP had considered treating her blood pressure.  I think it is time for her to be treated.  Labs reviewed from November 2018 showed total cholesterol 205, HDL 79, LDL 114 and triglycerides 60.  07/03/2018  [name] returns today for follow-up.  She called in over the weekend noting that she had had some recent speech difficulty.  Her speech was garbled and it was noted by her husband Regino Schultze, MD who is a retired Publishing rights manager.  The episode was transient although very upsetting for her.  She had been on aspirin in the past but has not recently taken it.  In fact she is only on a few medications.  She is compliant with lisinopril and has seen a marked improvement in blood pressure 136/60.  According to her husband who did a brief neurologic exam she had no focal neurologic deficits.  She does have a history of VSD but no known atrial septal defect or PFO.  In addition she had mild to moderate bilateral carotid artery disease by ultrasound in 2016.  Denies any palpitations or arrhythmias that she is aware of.  PMHx:  Past  Medical History:  Diagnosis Date  . Arthritis   . Heart murmur     Past Surgical History:  Procedure Laterality Date  . ABDOMINAL HYSTERECTOMY     age 5  . APPENDECTOMY    . DOPPLER ECHOCARDIOGRAPHY  2013  . Duplex Doppler  2013  . EXCISIONAL TOTAL HIP ARTHROPLASTY WITH ANTIBIOTIC SPACERS  09/25/2012   Procedure: EXCISIONAL TOTAL HIP ARTHROPLASTY WITH ANTIBIOTIC SPACERS;  Surgeon: [name], MD;  Location: [facility];  Service: Orthopedics;  Laterality: Left;  RESECTION LEFT TOTAL HIP ARTHROPLASTY AND PLACEMENT OF ANTIBIOTIC  SPACERs  . EYE SURGERY  02/2010   bilateral cataracts  . 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 JOINT REPLACEMENT  2006   left hip, revision 2009 and 07/2011  . JOINT REPLACEMENT  10/2007   right hip, revision 12/2008    FAMHx:  Family History  Problem Relation Age of Onset  . Failure to thrive Mother   . Stroke Father     SOCHx:   reports that she has never smoked. She has never used smokeless tobacco. She reports that she drinks about 1.0 standard drinks of alcohol per week. She reports that she does not use drugs.  ALLERGIES:  Allergies  Allergen Reactions  . Bactrim Rash    ROS: Pertinent items noted in HPI and remainder of comprehensive ROS otherwise negative.  HOME MEDS: Current Outpatient Medications  Medication Sig Dispense Refill  . Calcium Carbonate-Vitamin D (CALCIUM + D PO) Take by mouth daily.     Marland Kitchen lisinopril (PRINIVIL,ZESTRIL) 10 MG tablet Take 1 tablet (10 mg total) by mouth daily. 90 tablet 3  . MYRBETRIQ 50 MG TB24 Take 50 mg by mouth daily.      No current facility-administered medications for this visit.     LABS/IMAGING: No results found for this or any previous visit (from the past 48 hour(s)). No results found.  WEIGHTS: Wt Readings from Last 3 Encounters:  07/03/18 104 lb 12.8 oz (47.5 kg)  04/11/18 107 lb 12.8 oz (48.9 kg)  03/21/16 109 lb 12.8 oz (49.8 kg)    VITALS: BP 136/60 (BP Location: Right Arm)   Pulse 75   Ht 5' (1.524 m)   Wt 104 lb 12.8 oz (47.5 kg)   BMI 20.47 kg/m   EXAM: General appearance: alert and no distress Neck: no carotid bruit and no JVD Lungs: clear to auscultation bilaterally Heart: regular rate and rhythm, S1, S2 normal and systolic murmur: holosystolic 3/6, crescendo at 2nd  right intercostal space Abdomen: soft, non-tender; bowel sounds normal; no masses,  no organomegaly Extremities: extremities normal, atraumatic, no cyanosis or edema and varicose veins noted Pulses: 2+ and symmetric Skin: Skin color, texture, turgor normal. No rashes or lesions Neurologic: Grossly normal Psych: Normal  EKG: Normal sinus rhythm 75, possible left atrial enlargement-personally reviewed  ASSESSMENT: 1. Probable TIA 2. Essential hypertension 3. Small perimembranous VSD 4. Mild pulmonary hypertension 5. Varicose veins  PLAN: 1.   [name] deems to have had a probable TIA.  Her husband, who is a retired Publishing rights manager, noted that she had no focal neurologic deficits after the episode but felt it was also a TIA.  She will need further work-up for this.  I am recommending a 30-day monitor to assess  for atrial fibrillation.  We will also repeat an echocardiogram with bubble study to look for possible atrial septal defect or PFO.  Finally, we will repeat carotid Dopplers.  I like to refer her to neurology to see Dr. Leonie Man at [facility] neurologic for further stroke work-up.  He may wish to do cerebral imaging.  Finally, I have also advised her to start taking aspirin 81 mg daily.  Follow-up with me afterwards.  Pixie Casino, MD, [facility], [place] Director of the Advanced Lipid Disorders &  Cardiovascular Risk Reduction Clinic Diplomate of the American Board of Clinical Lipidology Attending Cardiologist  Direct Dial: [phone]  Fax: [phone]  Website:  [email or web address] 07/03/2018, 11:21 AM

## 2018-07-03 NOTE — Patient Instructions (Addendum)
Your physician has recommended you make the following change in your medication: START aspirin 81mg  daily  Your physician has requested that you have an echocardiogram. Echocardiography is a painless test that uses sound waves to create images of your heart. It provides your doctor with information about the size and shape of your heart and how well your heart's chambers and valves are working. This procedure takes approximately one hour. There are no restrictions for this procedure. -- done @ [address] physician has recommended that you wear an event monitor. Event monitors are medical devices that record the heart's electrical activity. Doctors most often [place] these monitors to diagnose arrhythmias. Arrhythmias are problems with the speed or rhythm of the heartbeat. The monitor is a small, portable device. You can wear one while you do your normal daily activities. This is usually used to diagnose what is causing palpitations/syncope (passing out). -- done at [address] physician has requested that you have a carotid duplex. This test is an ultrasound of the carotid arteries in your neck. It looks at blood flow through these arteries that supply the brain with blood. Allow one hour for this exam. There are no restrictions or special instructions. -- done at Dr. Lysbeth Penner office   You have been referred to Dr. Leonie Man with [facility]   Your physician recommends that you schedule a follow-up appointment in: 6-8 weeks with Dr. Debara Pickett.

## 2018-07-04 DIAGNOSIS — Z96642 Presence of left artificial hip joint: Secondary | ICD-10-CM | POA: Diagnosis not present

## 2018-07-04 DIAGNOSIS — M25552 Pain in left hip: Secondary | ICD-10-CM | POA: Diagnosis not present

## 2018-07-09 ENCOUNTER — Telehealth: Payer: Self-pay | Admitting: Neurology

## 2018-07-09 ENCOUNTER — Encounter: Payer: Self-pay | Admitting: Neurology

## 2018-07-09 ENCOUNTER — Ambulatory Visit (INDEPENDENT_AMBULATORY_CARE_PROVIDER_SITE_OTHER): Payer: PPO | Admitting: Neurology

## 2018-07-09 VITALS — BP 139/76 | HR 70 | Ht 60.0 in | Wt 106.6 lb

## 2018-07-09 DIAGNOSIS — G459 Transient cerebral ischemic attack, unspecified: Secondary | ICD-10-CM | POA: Diagnosis not present

## 2018-07-09 DIAGNOSIS — R4789 Other speech disturbances: Secondary | ICD-10-CM | POA: Diagnosis not present

## 2018-07-09 NOTE — Progress Notes (Signed)
[facility] [address] [place] [address] [phone]       OFFICE CONSULT NOTE  [name]. [name] Date of Birth:  06-25-1941 Medical Record Number:  [id number]   Referring MD:  Mali Hilty Reason for Referral:  Speech disturbance HPI: [name] is a pleasant 77 year old Caucasian lady who seen today for initial office consultation visit for episodes of possible TIAs. History is obtained from the patient and review of electronic medical records. She states on 07/01/18 she had been working out in the yard and when she came indoors she noticed that she was having trouble speaking and was using the wrong words. This happened a couple of times and she substituted for wrong word with new immediately that she had done so. She did not struggle to speak. She denied any slurred speech. She denied any complaint headache, blurred vision, double vision, vertigo, focal extremity weakness and numbness. She was all right for several hours but later on this evening the same thing happened again and she used a few wrong words when speaking. Her husband is a retired Publishing rights manager noticed this. Has been examined her and did not sign any focal deficits to suggest a stroke. The patient had appointment to see a cardiologist Dr. Debara Pickett a few days later and mention this prompting this referral. She denies any prior history of strokes, TIAs, migraines or other neurological problems.Patient does admit that she does not drink a lot of fluids and had been working out in the yard that day and also could have been dehydrated. She has appointments next week for an echocardiogram, Holter monitor and carotid Dopplers which have not yet been done. She denies any trouble with her memory. She is independent in activities of daily living. She has no family history of dementia progressive aphasia. She has no known significant vascular risk factors xcept hypertension for which she takes lisinopril.she has been  started on aspirin 81 mg last week by Dr. Debara Pickett.she has not had any brain imaging studies done or lab work for cholesterol and diabetes checked yet  ROS:   14 system review of systems is positive for  Speech disturbance, warfarin difficulty, easy bruising, no murmur and all other systems negative  PMH:  Past Medical History:  Diagnosis Date  . Arthritis   . Heart murmur     Social History:  Social History   Socioeconomic History  . Marital status: Married    Spouse name: Not on file  . Number of children: Not on file  . Years of education: Not on file  . Highest education level: Not on file  Occupational History  . Not on file  Social Needs  . Financial resource strain: Not on file  . Food insecurity:    Worry: Not on file    Inability: Not on file  . Transportation needs:    Medical: Not on file    Non-medical: Not on file  Tobacco Use  . Smoking status: Never Smoker  . Smokeless tobacco: Never Used  Substance and Sexual Activity  . Alcohol use: Yes    Alcohol/week: 1.0 standard drinks    Types: 1 Glasses of wine per week    Comment: occ.  . Drug use: No  . Sexual activity: Not on file  Lifestyle  . Physical activity:    Days per week: Not on file    Minutes per session: Not on file  . Stress: Not on file  Relationships  . Social connections:  Talks on phone: Not on file    Gets together: Not on file    Attends religious service: Not on file    Active member of club or organization: Not on file    Attends meetings of clubs or organizations: Not on file    Relationship status: Not on file  . Intimate partner violence:    Fear of current or ex partner: Not on file    Emotionally abused: Not on file    Physically abused: Not on file    Forced sexual activity: Not on file  Other Topics Concern  . Not on file  Social History Narrative  . Not on file    Medications:   Current Outpatient Medications on File Prior to Visit  Medication Sig Dispense Refill    . aspirin EC 81 MG tablet Take 81 mg by mouth daily.    . Calcium Carbonate-Vitamin D (CALCIUM + D PO) Take by mouth daily.     Marland Kitchen lisinopril (PRINIVIL,ZESTRIL) 10 MG tablet Take 1 tablet (10 mg total) by mouth daily. 90 tablet 3  . MYRBETRIQ 50 MG TB24 Take 50 mg by mouth daily.      No current facility-administered medications on file prior to visit.     Allergies:   Allergies  Allergen Reactions  . Bactrim Rash    Physical Exam General: frail pleasant elderly Caucasian lady, seated, in no evident distress Head: head normocephalic and atraumatic.   Neck: supple with no carotid or supraclavicular bruits Cardiovascular: regular rate and rhythm, no murmurs Musculoskeletal: no deformity Skin:  no rash/petichiae Vascular:  Normal pulses all extremities  Neurologic Exam Mental Status: Awake and fully alert. Oriented to place and time. Recent and remote memory intact. Attention span, concentration and fund of knowledge appropriate. Mood and affect appropriate. Recall 3/3. Able to name 13 animals with for legs. Clock drawing 3/4. No paraphasic errors or word substitutions. Intact registration, recall naming and the petition Cranial Nerves: Fundoscopic exam reveals sharp disc margins. Pupils equal, briskly reactive to light. Extraocular movements full without nystagmus. Visual fields full to confrontation. Hearing intact. Facial sensation intact. Face, tongue, palate moves normally and symmetrically.  Motor: Normal bulk and tone. Normal strength in all tested extremity muscles. Sensory.: intact to touch , pinprick , position and vibratory sensation.  Coordination: Rapid alternating movements normal in all extremities. Finger-to-nose and heel-to-shin performed accurately bilaterally. Gait and Station: Arises from chair without difficulty. Stance is normal. Gait demonstrates normal stride length and balance . Able to heel, toe and tandem walk with mild difficulty.  Reflexes: 1+ and symmetric.  Toes downgoing.   NIHSS  0 Modified Rankin  0  ASSESSMENT: 76 year patient with 2 brief episodes of word substitutions and speech difficulties of unclear etiology. Possibilities include TIA versus early  primary progressive aphasia     PLAN: I had a long d/w patient about her recent episode of transient speech abnormality, risk for recurrent stroke/TIAs, personally independently reviewed imaging studies and stroke evaluation results and answered questions.Continue aspirin 81 mg daily  for secondary stroke prevention and maintain strict control of hypertension with blood pressure goal below 130/90, diabetes with hemoglobin A1c goal below 6.5% and lipids with LDL cholesterol goal below 70 mg/dL. I also advised the patient to eat a healthy diet with plenty of whole grains, cereals, fruits and vegetables, exercise regularly and maintain ideal body weight. I recommend the check MRI scan of brain with MRA of the brain, lipid profile and hemoglobin A1c. Dr.  Hilty is already ordered an echocardiogram ,carotid ultrasound and Holter monitor.Greater than 50% time during this 45 minute consultation visit was spent on counseling and coordination of care about her episode of speech disturbance, TIA and answering questions Followup in the future with me in 2 months or call earlier if necessary. [name] Contras, MD  [facility] [address] [place], [place] [address]  Phone [phone] Fax [phone] Note: This document was prepared with digital dictation and possible smart phrase technology. Any transcriptional errors that result from this process are unintentional.

## 2018-07-09 NOTE — Patient Instructions (Addendum)
I had a long d/w patient about her recent episode of transient speech abnormality, risk for recurrent stroke/TIAs, personally independently reviewed imaging studies and stroke evaluation results and answered questions.Continue aspirin 81 mg daily  for secondary stroke prevention and maintain strict control of hypertension with blood pressure goal below 130/90, diabetes with hemoglobin A1c goal below 6.5% and lipids with LDL cholesterol goal below 70 mg/dL. I also advised the patient to eat a healthy diet with plenty of whole grains, cereals, fruits and vegetables, exercise regularly and maintain ideal body weight. I recommend the check MRI scan of brain with MRA of the brain, lipid profile and hemoglobin A1c. Dr. Debara Pickett is already ordered an echocardiogram ,carotid ultrasound and Holter monitor. Followup in the future with me in 2 months or call earlier if necessary.

## 2018-07-09 NOTE — Telephone Encounter (Signed)
Health team order sent to GI. No auth they will reach out to the pt to schedule.  °

## 2018-07-10 DIAGNOSIS — Z471 Aftercare following joint replacement surgery: Secondary | ICD-10-CM | POA: Diagnosis not present

## 2018-07-10 DIAGNOSIS — T859XXA Unspecified complication of internal prosthetic device, implant and graft, initial encounter: Secondary | ICD-10-CM | POA: Diagnosis not present

## 2018-07-10 LAB — LIPID PANEL
CHOLESTEROL TOTAL: 197 mg/dL (ref 100–199)
Chol/HDL Ratio: 2.4 ratio (ref 0.0–4.4)
HDL: 83 mg/dL (ref >39)
LDL Calculated: 99 mg/dL (ref 0–99)
Triglycerides: 76 mg/dL (ref 0–149)
VLDL Cholesterol Cal: 15 mg/dL (ref 5–40)

## 2018-07-10 LAB — HEMOGLOBIN A1C
Est. average glucose Bld gHb Est-mCnc: 94 mg/dL
Hgb A1c MFr Bld: 4.9 % (ref 4.8–5.6)

## 2018-07-11 ENCOUNTER — Ambulatory Visit (HOSPITAL_BASED_OUTPATIENT_CLINIC_OR_DEPARTMENT_OTHER): Payer: PPO

## 2018-07-11 ENCOUNTER — Other Ambulatory Visit: Payer: Self-pay

## 2018-07-11 ENCOUNTER — Ambulatory Visit (HOSPITAL_COMMUNITY)
Admission: RE | Admit: 2018-07-11 | Discharge: 2018-07-11 | Disposition: A | Discharge status: Home / Self Care | Payer: PPO | Source: Ambulatory Visit | Attending: Internal Medicine | Admitting: Internal Medicine

## 2018-07-11 ENCOUNTER — Other Ambulatory Visit: Payer: Self-pay | Admitting: Internal Medicine

## 2018-07-11 ENCOUNTER — Ambulatory Visit (INDEPENDENT_AMBULATORY_CARE_PROVIDER_SITE_OTHER): Payer: PPO

## 2018-07-11 DIAGNOSIS — I4891 Unspecified atrial fibrillation: Secondary | ICD-10-CM | POA: Diagnosis not present

## 2018-07-11 DIAGNOSIS — Q210 Ventricular septal defect: Secondary | ICD-10-CM

## 2018-07-11 DIAGNOSIS — I739 Peripheral vascular disease, unspecified: Secondary | ICD-10-CM

## 2018-07-11 DIAGNOSIS — G459 Transient cerebral ischemic attack, unspecified: Secondary | ICD-10-CM

## 2018-07-11 DIAGNOSIS — I779 Disorder of arteries and arterioles, unspecified: Secondary | ICD-10-CM

## 2018-07-12 ENCOUNTER — Inpatient Hospital Stay (HOSPITAL_COMMUNITY): Admission: RE | Admit: 2018-07-12 | Payer: PPO | Source: Ambulatory Visit

## 2018-07-17 ENCOUNTER — Telehealth: Payer: Self-pay | Admitting: Neurology

## 2018-07-17 NOTE — Telephone Encounter (Signed)
Rn call patient to make sure she got the results. PT verbalized of her labs.

## 2018-07-17 NOTE — Telephone Encounter (Signed)
Pt request lab results. May leave detailed msg on home VM

## 2018-07-17 NOTE — Telephone Encounter (Signed)
-----   Message from Garvin Fila, MD sent at 07/13/2018 11:49 AM EDT ----- Mitchell Heir inform the patient that screening blood work for diabetes was satisfactory and lipid profile was borderline. No need for any medication changes at this time

## 2018-07-17 NOTE — Telephone Encounter (Signed)
Notes recorded by Marval Regal, RN on 07/17/2018 at 12:26 PM EDT Left detail message per pt request. Rn call back and gave lab results over phone. Rn left vm stating diabetes were satisfactory, lipid panel was borderline. No need for any medication changes at this time. Rn left number if patient had any questions. ------

## 2018-07-28 DIAGNOSIS — Z23 Encounter for immunization: Secondary | ICD-10-CM | POA: Diagnosis not present

## 2018-08-07 ENCOUNTER — Other Ambulatory Visit: Payer: Self-pay | Admitting: Orthopaedic Surgery

## 2018-08-07 DIAGNOSIS — Z471 Aftercare following joint replacement surgery: Secondary | ICD-10-CM

## 2018-08-07 DIAGNOSIS — Z96642 Presence of left artificial hip joint: Secondary | ICD-10-CM

## 2018-08-07 DIAGNOSIS — T859XXA Unspecified complication of internal prosthetic device, implant and graft, initial encounter: Secondary | ICD-10-CM

## 2018-08-15 ENCOUNTER — Ambulatory Visit
Admission: RE | Admit: 2018-08-15 | Discharge: 2018-08-15 | Disposition: A | Discharge status: Home / Self Care | Payer: PPO | Source: Ambulatory Visit | Attending: Neurology | Admitting: Neurology

## 2018-08-15 ENCOUNTER — Other Ambulatory Visit: Payer: PPO

## 2018-08-15 DIAGNOSIS — G459 Transient cerebral ischemic attack, unspecified: Secondary | ICD-10-CM

## 2018-08-15 MED ORDER — GADOBENATE DIMEGLUMINE 529 MG/ML IV SOLN
9.0000 mL | Freq: Once | INTRAVENOUS | Status: AC | PRN
  Administered 2018-08-15: 9 mL via INTRAVENOUS

## 2018-08-16 ENCOUNTER — Ambulatory Visit
Admission: RE | Admit: 2018-08-16 | Discharge: 2018-08-16 | Disposition: A | Discharge status: Home / Self Care | Payer: PPO | Source: Ambulatory Visit | Attending: Orthopaedic Surgery | Admitting: Orthopaedic Surgery

## 2018-08-16 DIAGNOSIS — T859XXA Unspecified complication of internal prosthetic device, implant and graft, initial encounter: Secondary | ICD-10-CM

## 2018-08-16 DIAGNOSIS — M25551 Pain in right hip: Secondary | ICD-10-CM | POA: Diagnosis not present

## 2018-08-16 DIAGNOSIS — Z471 Aftercare following joint replacement surgery: Secondary | ICD-10-CM

## 2018-08-16 DIAGNOSIS — Z96642 Presence of left artificial hip joint: Secondary | ICD-10-CM

## 2018-08-17 ENCOUNTER — Other Ambulatory Visit: Payer: Self-pay | Admitting: Neurology

## 2018-08-17 MED ORDER — ROSUVASTATIN CALCIUM 5 MG PO TABS: 5.0000 mg | ORAL_TABLET | Freq: Every day | ORAL | 1 refills | Status: DC

## 2018-08-20 ENCOUNTER — Ambulatory Visit: Payer: PPO | Admitting: Internal Medicine

## 2018-08-20 ENCOUNTER — Telehealth: Payer: Self-pay

## 2018-08-20 ENCOUNTER — Encounter: Payer: Self-pay | Admitting: Internal Medicine

## 2018-08-20 VITALS — BP 148/64 | HR 72 | Ht 60.0 in | Wt 105.2 lb

## 2018-08-20 DIAGNOSIS — I639 Cerebral infarction, unspecified: Secondary | ICD-10-CM | POA: Insufficient documentation

## 2018-08-20 DIAGNOSIS — E7800 Pure hypercholesterolemia, unspecified: Secondary | ICD-10-CM | POA: Diagnosis not present

## 2018-08-20 NOTE — Telephone Encounter (Signed)
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 MRA of the brain showed no significant intracranial stenosis. I also reviewed carotid ultrasound and 2-D echo results in: [facility] which were both unremarkable. I recommend she start taking Crestor 5 mg daily for her LDL cholesterol of 99 mg percent with target LDL below 70. She was advised to keep her appointment to see me next month. She voiced understanding.I also asked her to check her external cardiac monitoring results from Dr. Debara Pickett

## 2018-08-20 NOTE — Telephone Encounter (Signed)
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 MRA of the brain showed no significant intracranial stenosis. I also reviewed carotid ultrasound and 2-D echo results in: [facility] which were both unremarkable. I recommend she start taking Crestor 5 mg daily for her LDL cholesterol of 99 mg percent with target LDL below 70. She was advised to keep her appointment to see me next month. She voiced understanding.I also asked her to check her external cardiac monitoring results from Dr. Debara Pickett

## 2018-08-20 NOTE — Progress Notes (Signed)
I agree loop recorder implant will be beneficial as she meets criteria for cryptogenic stroke.

## 2018-08-20 NOTE — Progress Notes (Signed)
Thanks. I agree with the above plan for loop recorder on 08/23/18

## 2018-08-20 NOTE — Patient Instructions (Addendum)
  [facility], [place]  [place], [place] [address]  Phone: [phone] Fax: [phone]   You are scheduled for a Loop Recorder Implant on Thursday October 10 with Dr. Sallyanne Kuster.   Please arrive at the [facility] at 8 am on the day of your procedure.    You do not need to be fasting.   You do not need pre-procedure lab work or testing.   Please bring your insurance cards and a list of your medications with you.   Wash your chest and neck with the surgical soap provided the evening before and the morning of your procedure. Please following the washing instructions provided.   There aren't any restrictions after the procedure. You will receive wound care instructions upon discharge.  If you have any questions after you get home, please call the office at [phone].   Thank you, [name], MD   * Special note:  Every effort is made to have your procedure done on time.  Occasionally there are emergencies that present themselves at the hospital that may cause delays.  Please be patient if a delay does occur.    Preparing for Surgery  Before surgery, you can play an important role. Because skin is not sterile, your skin needs to be as free of germs as possible. You can reduce the number of germs on your skin by washing with CHG (chlorhexidine gluconate) Soap before surgery. CHG is an antiseptic cleaner which kills germs and bonds with the skin to continue killing germs even after washing.  Please do not use if you have an allergy to CHG or antibacterial soaps. If your skin becomes reddened/irritated, STOP using the CHG.  DO NOT SHAVE (including legs and underarms) for at least 48 hours prior to first CHG shower. It is OK to shave your face.  Please follow these instructions carefully: 1. Shower the night before surgery and the morning of surgery with CHG  Soap. 2. If you chose to wash your hair, wash your hair first as usual with your normal shampoo/conditioner. 3. After you shampoo/condition, rinse your hair and body thoroughly to remove shampoo/conditioner. 4. Use CHG as you would any other liquid soap. You can apply CHG directly to the skin and wash gently with a loofah or a clean washcloth. 5. Apply the CHG Soap to your body ONLY FROM THE NECK DOWN. Do not use on open wounds or open sores. Avoid contact with your eyes, ears, mouth, and genitals (private parts). Wash genitals (private parts) with your normal soap. 6. Wash thoroughly, paying special attention to the area where your surgery will be performed. 7. Thoroughly rinse your body with warm water from the neck down. 8. DO NOT shower/wash with your normal soap after using and rinsing off the CHG Soap. 9. Pat yourself dry with a clean towel. 10. Wear clean pajamas to bed. 11. Place clean sheets on your bed the night of your first shower and do not sleep with pets..  Day of Surgery: Shower with the CHG Soap following the instructions listed above. DO NOT apply deodorants or lotions. Please wear clean clothes to the hospital/surgery center.    Your physician recommends that you return for lab work in [place] (fasting)  Your physician recommends that you schedule a follow-up appointment in [place] with Dr. Debara Pickett.

## 2018-08-20 NOTE — H&P (View-Only) (Signed)
OFFICE NOTE  Chief Complaint:  Follow-up studies  Primary Care Physician: Marton Redwood, MD  HPI:  [name] is a pleasant 77 year old female who was previously followed by Dr. Rex Kras. I last saw her in April 2014 for follow-up of a small membranous VSD. She also has very mild pulmonary hypertension. She does have of course a loud murmur which is associated with small VSDs. Her last echo was in 13 and EF was 55% or greater at the time. Normal wall motion was noted. There was mild mitral annular calcification with mild to moderate mitral regurgitation. There was mild tricuspid regurgitation as well. She is overall asymptomatic. She denies chest pain or worsening shortness of breath. She is on limited medications including aspirin, calcium and Myrbetriq.  03/21/2016  [name] returns today for follow-up. She's done extremely well. 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 In the past she's had mild pulmonary hypertension however her echo last year was not able to estimate pulmonary pressures. Her EF is remains stable. Overall she is doing well without any symptoms of shortness of breath or exercise intolerance. She denies any chest pain. She does have lower extremity varicose veins and has a history of vein stripping in the past. She occasionally gets some swelling of her legs.  04/11/2018  [name] was seen today in follow-up.'s been 2 years since I last saw her.  She has a stable small membranous VSD.  EF was unchanged by echo in 2017.  She denies worsening shortness of breath or chest pain or any change in exercise capacity.  She still struggles with varicose veins.  She also has some toe deformity on the left foot and is contemplating surgery.  She rarely gets swelling of her legs.  Interestingly her blood pressure has been elevated.  It was notably high in her PCPs office in December at 181/90.  She also saw her GYN who noted her blood pressure was  elevated.  In addition her blood pressure is elevated here.  She has a list of blood pressure she took at home which I reviewed.  While there are occasional blood pressures in the low 100s in general they are between 416-606 systolic.  Overall, I feel that she may be developing some hypertension, possibly due to arterial sclerosis or aging.  Her PCP had considered treating her blood pressure.  I think it is time for her to be treated.  Labs reviewed from November 2018 showed total cholesterol 205, HDL 79, LDL 114 and triglycerides 60.  07/03/2018  [name] returns today for follow-up.  She called in over the weekend noting that she had had some recent speech difficulty.  Her speech was garbled and it was noted by her husband Regino Schultze, MD who is a retired Publishing rights manager.  The episode was transient although very upsetting for her.  She had been on aspirin in the past but has not recently taken it.  In fact she is only on a few medications.  She is compliant with lisinopril and has seen a marked improvement in blood pressure 136/60.  According to her husband who did a brief neurologic exam she had no focal neurologic deficits.  She does have a history of VSD but no known atrial septal defect or PFO.  In addition she had mild to moderate bilateral carotid artery disease by ultrasound in 2016.  Denies any palpitations or arrhythmias that she is aware of.  08/20/2018  [name] returns today for follow-up.  She underwent 30-day monitoring which demonstrated a short run of tachycardia which was mildly irregular concerning for possible A. fib however duration was less than a few seconds.  She did not have any other episodes.  She did have an MRI which showed a hypointense lesion in the left parietal area suggestive of possible subacute stroke.  This may fit with her symptom pattern.  She is currently on aspirin however I am highly suspicious that this may have been a cardioembolic event.  I agree with her  neurologist recommendation to start Crestor 5 mg with goal LDL less than 70.  Based on the fact that there was very infrequent A. fib, there is not enough evidence to consider starting long-term anticoagulation at this point.  However due to my increased suspicions, I think she is a good candidate for an implanted loop recorder.  PMHx:  Past Medical History:  Diagnosis Date  . Arthritis   . Heart murmur     Past Surgical History:  Procedure Laterality Date  . ABDOMINAL HYSTERECTOMY     age 87  . APPENDECTOMY    . DOPPLER ECHOCARDIOGRAPHY  2013  . Duplex Doppler  2013  . EXCISIONAL TOTAL HIP ARTHROPLASTY WITH ANTIBIOTIC SPACERS  09/25/2012   Procedure: EXCISIONAL TOTAL HIP ARTHROPLASTY WITH ANTIBIOTIC SPACERS;  Surgeon: [name], MD;  Location: [facility];  Service: Orthopedics;  Laterality: Left;  RESECTION LEFT TOTAL HIP ARTHROPLASTY AND PLACEMENT OF ANTIBIOTIC SPACERs  . EYE SURGERY  02/2010   bilateral cataracts  . 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 JOINT REPLACEMENT  2006   left hip, revision 2009 and 07/2011  . JOINT REPLACEMENT  10/2007   right hip, revision 12/2008    FAMHx:  Family History  Problem Relation Age of Onset  . Failure to thrive Mother   . Stroke Father     SOCHx:   reports that she has never smoked. She has never used smokeless tobacco. She reports that she drinks about 1.0 standard drinks of alcohol per week. She reports that she does not use drugs.  ALLERGIES:  Allergies  Allergen Reactions  . Bactrim Rash    ROS: Pertinent items noted in HPI and remainder of comprehensive ROS otherwise negative.  HOME MEDS: Current Outpatient Medications  Medication Sig Dispense Refill  . aspirin EC 81 MG tablet Take 81 mg by  mouth daily.    . Calcium Carbonate-Vitamin D (CALCIUM + D PO) Take by mouth daily.     Marland Kitchen lisinopril (PRINIVIL,ZESTRIL) 10 MG tablet Take 10 mg by mouth daily.    Marland Kitchen MYRBETRIQ 50 MG TB24 Take 50 mg by mouth daily.     . rosuvastatin (CRESTOR) 5 MG tablet Take 1 tablet (5 mg total) by mouth daily. 30 tablet 1   No current facility-administered medications for this visit.     LABS/IMAGING: No results found for this or any previous visit (from the past 48 hour(s)). No results found.  WEIGHTS: Wt Readings from Last 3 Encounters:  08/20/18 105 lb 3.2 oz (47.7 kg)  07/09/18 106 lb 9.6 oz (48.4 kg)  07/03/18 104 lb 12.8 oz (47.5 kg)    VITALS: BP (!) 148/64   Pulse 72   Ht 5' (1.524 m)  Wt 105 lb 3.2 oz (47.7 kg)   SpO2 99%   BMI 20.55 kg/m   EXAM: Deferred  EKG: Deferred  ASSESSMENT: 1. Stroke 2. Essential hypertension 3. Small perimembranous VSD 4. Mild pulmonary hypertension 5. Varicose veins  PLAN: 1.   [name] appears to have MRI findings of subacute stroke.  She has responded to the addition of lisinopril for hypertension and has good blood pressure control at home.  I agree with starting low-dose atorvastatin for cerebral artery disease.  There are not clear findings on her monitor to escalate to anticoagulation therapy based on a short episode of possible atrial fibrillation.  I would therefore like more evidence to support underlying A. fib before we could consider adding additional anticoagulation.  I discussed an implanted loop recorder monitor with her today and she is agreeable to proceed.  I will reach out to Dr. Loletha Grayer and hopefully we can arrange placement later this week.  Thanks again for allowing to participate her care.  Follow-up with me in 3 months.  Pixie Casino, MD, [facility], [place] Director of the Advanced Lipid Disorders &   Cardiovascular Risk Reduction [facility] Diplomate of the American Board of Clinical  Lipidology Attending Cardiologist  Direct Dial: [phone]  Fax: [phone]  Website:  [email or web address] 08/20/2018, 10:34 AM

## 2018-08-20 NOTE — Progress Notes (Signed)
OFFICE NOTE  Chief Complaint:  Follow-up studies  Primary Care Physician: Marton Redwood, MD  HPI:  [name] is a pleasant 77 year old female who was previously followed by Dr. Rex Kras. I last saw her in April 2014 for follow-up of a small membranous VSD. She also has very mild pulmonary hypertension. She does have of course a loud murmur which is associated with small VSDs. Her last echo was in 13 and EF was 55% or greater at the time. Normal wall motion was noted. There was mild mitral annular calcification with mild to moderate mitral regurgitation. There was mild tricuspid regurgitation as well. She is overall asymptomatic. She denies chest pain or worsening shortness of breath. She is on limited medications including aspirin, calcium and Myrbetriq.  03/21/2016  [name] returns today for follow-up. She's done extremely well. 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 In the past she's had mild pulmonary hypertension however her echo last year was not able to estimate pulmonary pressures. Her EF is remains stable. Overall she is doing well without any symptoms of shortness of breath or exercise intolerance. She denies any chest pain. She does have lower extremity varicose veins and has a history of vein stripping in the past. She occasionally gets some swelling of her legs.  04/11/2018  [name] was seen today in follow-up.'s been 2 years since I last saw her.  She has a stable small membranous VSD.  EF was unchanged by echo in 2017.  She denies worsening shortness of breath or chest pain or any change in exercise capacity.  She still struggles with varicose veins.  She also has some toe deformity on the left foot and is contemplating surgery.  She rarely gets swelling of her legs.  Interestingly her blood pressure has been elevated.  It was notably high in her PCPs office in December at 181/90.  She also saw her GYN who noted her blood pressure was  elevated.  In addition her blood pressure is elevated here.  She has a list of blood pressure she took at home which I reviewed.  While there are occasional blood pressures in the low 100s in general they are between 194-174 systolic.  Overall, I feel that she may be developing some hypertension, possibly due to arterial sclerosis or aging.  Her PCP had considered treating her blood pressure.  I think it is time for her to be treated.  Labs reviewed from November 2018 showed total cholesterol 205, HDL 79, LDL 114 and triglycerides 60.  07/03/2018  [name] returns today for follow-up.  She called in over the weekend noting that she had had some recent speech difficulty.  Her speech was garbled and it was noted by her husband [name] Schultze, MD who is a retired Publishing rights manager.  The episode was transient although very upsetting for her.  She had been on aspirin in the past but has not recently taken it.  In fact she is only on a few medications.  She is compliant with lisinopril and has seen a marked improvement in blood pressure 136/60.  According to her husband who did a brief neurologic exam she had no focal neurologic deficits.  She does have a history of VSD but no known atrial septal defect or PFO.  In addition she had mild to moderate bilateral carotid artery disease by ultrasound in 2016.  Denies any palpitations or arrhythmias that she is aware of.  08/20/2018  [name] returns today for follow-up.  She underwent 30-day monitoring which demonstrated a short run of tachycardia which was mildly irregular concerning for possible A. fib however duration was less than a few seconds.  She did not have any other episodes.  She did have an MRI which showed a hypointense lesion in the left parietal area suggestive of possible subacute stroke.  This may fit with her symptom pattern.  She is currently on aspirin however I am highly suspicious that this may have been a cardioembolic event.  I agree with her  neurologist recommendation to start Crestor 5 mg with goal LDL less than 70.  Based on the fact that there was very infrequent A. fib, there is not enough evidence to consider starting long-term anticoagulation at this point.  However due to my increased suspicions, I think she is a good candidate for an implanted loop recorder.  PMHx:  Past Medical History:  Diagnosis Date  . Arthritis   . Heart murmur     Past Surgical History:  Procedure Laterality Date  . ABDOMINAL HYSTERECTOMY     age 54  . APPENDECTOMY    . DOPPLER ECHOCARDIOGRAPHY  2013  . Duplex Doppler  2013  . EXCISIONAL TOTAL HIP ARTHROPLASTY WITH ANTIBIOTIC SPACERS  09/25/2012   Procedure: EXCISIONAL TOTAL HIP ARTHROPLASTY WITH ANTIBIOTIC SPACERS;  Surgeon: [name], MD;  Location: [facility];  Service: Orthopedics;  Laterality: Left;  RESECTION LEFT TOTAL HIP ARTHROPLASTY AND PLACEMENT OF ANTIBIOTIC SPACERs  . EYE SURGERY  02/2010   bilateral cataracts  . 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 JOINT REPLACEMENT  2006   left hip, revision 2009 and 07/2011  . JOINT REPLACEMENT  10/2007   right hip, revision 12/2008    FAMHx:  Family History  Problem Relation Age of Onset  . Failure to thrive Mother   . Stroke Father     SOCHx:   reports that she has never smoked. She has never used smokeless tobacco. She reports that she drinks about 1.0 standard drinks of alcohol per week. She reports that she does not use drugs.  ALLERGIES:  Allergies  Allergen Reactions  . Bactrim Rash    ROS: Pertinent items noted in HPI and remainder of comprehensive ROS otherwise negative.  HOME MEDS: Current Outpatient Medications  Medication Sig Dispense Refill  . aspirin EC 81 MG tablet Take 81 mg by  mouth daily.    . Calcium Carbonate-Vitamin D (CALCIUM + D PO) Take by mouth daily.     Marland Kitchen lisinopril (PRINIVIL,ZESTRIL) 10 MG tablet Take 10 mg by mouth daily.    Marland Kitchen MYRBETRIQ 50 MG TB24 Take 50 mg by mouth daily.     . rosuvastatin (CRESTOR) 5 MG tablet Take 1 tablet (5 mg total) by mouth daily. 30 tablet 1   No current facility-administered medications for this visit.     LABS/IMAGING: No results found for this or any previous visit (from the past 48 hour(s)). No results found.  WEIGHTS: Wt Readings from Last 3 Encounters:  08/20/18 105 lb 3.2 oz (47.7 kg)  07/09/18 106 lb 9.6 oz (48.4 kg)  07/03/18 104 lb 12.8 oz (47.5 kg)    VITALS: BP (!) 148/64   Pulse 72   Ht 5' (1.524 m)  Wt 105 lb 3.2 oz (47.7 kg)   SpO2 99%   BMI 20.55 kg/m   EXAM: Deferred  EKG: Deferred  ASSESSMENT: 1. Stroke 2. Essential hypertension 3. Small perimembranous VSD 4. Mild pulmonary hypertension 5. Varicose veins  PLAN: 1.   Mrs. Newnam appears to have MRI findings of subacute stroke.  She has responded to the addition of lisinopril for hypertension and has good blood pressure control at home.  I agree with starting low-dose atorvastatin for cerebral artery disease.  There are not clear findings on her monitor to escalate to anticoagulation therapy based on a short episode of possible atrial fibrillation.  I would therefore like more evidence to support underlying A. fib before we could consider adding additional anticoagulation.  I discussed an implanted loop recorder monitor with her today and she is agreeable to proceed.  I will reach out to Dr. Loletha Grayer and hopefully we can arrange placement later this week.  Thanks again for allowing to participate her care.  Follow-up with me in 3 months.  Pixie Casino, MD, [facility], [place] Director of the [facility] Lipid [facility]   Cardiovascular [facility] Diplomate of the American Board of Clinical  Lipidology Attending Cardiologist  Direct Dial: [phone]  Fax: [phone]  Website:  [email or web address] Lamont Glasscock 08/20/2018, 10:34 AM

## 2018-08-20 NOTE — Progress Notes (Signed)
Will do this week, thanks! MCr

## 2018-08-21 NOTE — Telephone Encounter (Signed)
Garvin Fila, MD  Marval Regal, RN        I called the patient to give her the results of the cardiac monitoring but unable to reach her and there was no answering machine. She will need a loop recorder to monitor for paroxysmal A. fib.

## 2018-08-21 NOTE — Telephone Encounter (Signed)
Pt has called stating she missed a call around 5pm on yesterday.  RN Katrina confirmed Dr Leonie Man called pt.  She asked that a message be documented and that Dr Leonie Man will call pt when he becomes available.  Pt was told Dr Leonie Man will call her back and she is okay with this.

## 2018-08-21 NOTE — Telephone Encounter (Signed)
I called patient and communicated the rationale for doing a loop recorder to detect AFIb to justify need for long term antiocoagulation. She voiced understanding and is agreeable with plan

## 2018-08-23 ENCOUNTER — Encounter (HOSPITAL_COMMUNITY)
Admission: RE | Disposition: A | Discharge status: Home / Self Care | Payer: Self-pay | Source: Ambulatory Visit | Attending: Cardiovascular Disease

## 2018-08-23 ENCOUNTER — Ambulatory Visit (HOSPITAL_COMMUNITY)
Admission: RE | Admit: 2018-08-23 | Discharge: 2018-08-23 | Disposition: A | Discharge status: Home / Self Care | Payer: PPO | Source: Ambulatory Visit | Attending: Cardiovascular Disease | Admitting: Cardiovascular Disease

## 2018-08-23 ENCOUNTER — Other Ambulatory Visit: Payer: Self-pay

## 2018-08-23 DIAGNOSIS — Z823 Family history of stroke: Secondary | ICD-10-CM | POA: Diagnosis not present

## 2018-08-23 DIAGNOSIS — I6389 Other cerebral infarction: Secondary | ICD-10-CM | POA: Diagnosis not present

## 2018-08-23 DIAGNOSIS — Z882 Allergy status to sulfonamides status: Secondary | ICD-10-CM | POA: Diagnosis not present

## 2018-08-23 DIAGNOSIS — I272 Pulmonary hypertension, unspecified: Secondary | ICD-10-CM | POA: Diagnosis not present

## 2018-08-23 DIAGNOSIS — Z881 Allergy status to other antibiotic agents status: Secondary | ICD-10-CM | POA: Diagnosis not present

## 2018-08-23 DIAGNOSIS — I4891 Unspecified atrial fibrillation: Secondary | ICD-10-CM | POA: Diagnosis not present

## 2018-08-23 DIAGNOSIS — I8390 Asymptomatic varicose veins of unspecified lower extremity: Secondary | ICD-10-CM | POA: Diagnosis not present

## 2018-08-23 DIAGNOSIS — I10 Essential (primary) hypertension: Secondary | ICD-10-CM | POA: Insufficient documentation

## 2018-08-23 DIAGNOSIS — Z90710 Acquired absence of both cervix and uterus: Secondary | ICD-10-CM | POA: Diagnosis not present

## 2018-08-23 DIAGNOSIS — Z96643 Presence of artificial hip joint, bilateral: Secondary | ICD-10-CM | POA: Insufficient documentation

## 2018-08-23 DIAGNOSIS — Z7982 Long term (current) use of aspirin: Secondary | ICD-10-CM | POA: Insufficient documentation

## 2018-08-23 DIAGNOSIS — Q210 Ventricular septal defect: Secondary | ICD-10-CM | POA: Diagnosis not present

## 2018-08-23 DIAGNOSIS — Z9889 Other specified postprocedural states: Secondary | ICD-10-CM | POA: Diagnosis not present

## 2018-08-23 DIAGNOSIS — I639 Cerebral infarction, unspecified: Secondary | ICD-10-CM

## 2018-08-23 DIAGNOSIS — M1990 Unspecified osteoarthritis, unspecified site: Secondary | ICD-10-CM | POA: Insufficient documentation

## 2018-08-23 DIAGNOSIS — I6523 Occlusion and stenosis of bilateral carotid arteries: Secondary | ICD-10-CM | POA: Insufficient documentation

## 2018-08-23 DIAGNOSIS — Z79899 Other long term (current) drug therapy: Secondary | ICD-10-CM | POA: Diagnosis not present

## 2018-08-23 HISTORY — PX: LOOP RECORDER INSERTION: EP1214

## 2018-08-23 SURGERY — LOOP RECORDER INSERTION

## 2018-08-23 MED ORDER — LIDOCAINE-EPINEPHRINE 1 %-1:100000 IJ SOLN
INTRAMUSCULAR | Status: DC | PRN
  Administered 2018-08-23: 30 mL

## 2018-08-23 MED ORDER — LIDOCAINE-EPINEPHRINE 1 %-1:100000 IJ SOLN
INTRAMUSCULAR | Status: AC
  Filled 2018-08-23: qty 1

## 2018-08-23 SURGICAL SUPPLY — 2 items
LOOP REVEAL LINQ LNQ11 (Prosthesis & Implant Heart) ×1 IMPLANT
PACK LOOP INSERTION (CUSTOM PROCEDURE TRAY) ×2 IMPLANT

## 2018-08-23 NOTE — Op Note (Signed)
LOOP RECORDER IMPLANT   Procedure report  Procedure performed:  Loop recorder implantation   Reason for procedure:  Cryptogenic stroke Procedure performed by:  [name], MD  Complications:  None  Estimated blood loss:  <5 mL  Medications administered during procedure:  Lidocaine 1% with 1/10,000 epinephrine 10 mL locally Device details:  Medtronic Reveal Linq model number [id number], serial number [id number] Procedure details:  After the risks and benefits of the procedure were discussed the patient provided informed consent. The patient was prepped and draped in usual sterile fashion. Local anesthesia was administered to an area 2 cm to the left of the sternum in the 4th intercostal space. A cutaneous incision was made using the incision tool. The introducer was then used to create a subcutaneous tunnel and carefully deploy the device. Local pressure was held to ensure hemostasis.  The incision was closed with SteriStrips and a sterile dressing was applied.  R waves 0.4 mV.  [name], MD, [place] [phone] office [phone] pager 08/23/2018 9:47 AM

## 2018-08-23 NOTE — Discharge Instructions (Signed)
DC instructions given verbally and in writing. See chart for signed copy.

## 2018-08-23 NOTE — Interval H&P Note (Signed)
History and Physical Interval Note:  08/23/2018 8:29 AM  [name]  has presented today for surgery, with the diagnosis of stroke  The various methods of treatment have been discussed with the patient and family. After consideration of risks, benefits and other options for treatment, the patient has consented to  Procedure(s): LOOP RECORDER INSERTION (N/A) as a surgical intervention .  The patient's history has been reviewed, patient examined, no change in status, stable for surgery.  I have reviewed the patient's chart and labs.  Questions were answered to the patient's satisfaction.     [name]

## 2018-08-24 ENCOUNTER — Encounter (HOSPITAL_COMMUNITY): Payer: Self-pay | Admitting: Cardiovascular Disease

## 2018-08-27 DIAGNOSIS — M2022 Hallux rigidus, left foot: Secondary | ICD-10-CM | POA: Diagnosis not present

## 2018-08-27 DIAGNOSIS — M79671 Pain in right foot: Secondary | ICD-10-CM | POA: Diagnosis not present

## 2018-08-27 DIAGNOSIS — M2042 Other hammer toe(s) (acquired), left foot: Secondary | ICD-10-CM | POA: Diagnosis not present

## 2018-08-27 DIAGNOSIS — M7742 Metatarsalgia, left foot: Secondary | ICD-10-CM | POA: Diagnosis not present

## 2018-08-27 DIAGNOSIS — M79672 Pain in left foot: Secondary | ICD-10-CM | POA: Diagnosis not present

## 2018-09-03 ENCOUNTER — Ambulatory Visit (INDEPENDENT_AMBULATORY_CARE_PROVIDER_SITE_OTHER): Payer: PPO | Admitting: *Deleted

## 2018-09-03 DIAGNOSIS — I639 Cerebral infarction, unspecified: Secondary | ICD-10-CM

## 2018-09-03 LAB — CUP PACEART INCLINIC DEVICE CHECK
Date Time Interrogation Session: 20191021170854
Implantable Pulse Generator Implant Date: 20191010

## 2018-09-03 NOTE — Progress Notes (Signed)
Wound check appointment. Steri-strips removed. Wound without redness or edema. Incision edges approximated, wound well healed. Normal device function. Battery status: good. R-waves 0.50mV. No symptom, tachy, pause, brady, or AF episodes. Patient educated about Carelink monitor. Monthly summary reports and ROV with [facility] PRN.

## 2018-09-05 ENCOUNTER — Other Ambulatory Visit: Payer: Self-pay | Admitting: Orthopaedic Surgery

## 2018-09-05 DIAGNOSIS — T859XXA Unspecified complication of internal prosthetic device, implant and graft, initial encounter: Secondary | ICD-10-CM

## 2018-09-05 DIAGNOSIS — M25552 Pain in left hip: Secondary | ICD-10-CM

## 2018-09-06 ENCOUNTER — Ambulatory Visit
Admission: RE | Admit: 2018-09-06 | Discharge: 2018-09-06 | Disposition: A | Discharge status: Home / Self Care | Payer: PPO | Source: Ambulatory Visit | Attending: Orthopaedic Surgery | Admitting: Orthopaedic Surgery

## 2018-09-06 DIAGNOSIS — T859XXA Unspecified complication of internal prosthetic device, implant and graft, initial encounter: Secondary | ICD-10-CM

## 2018-09-06 DIAGNOSIS — M25552 Pain in left hip: Secondary | ICD-10-CM | POA: Diagnosis not present

## 2018-09-11 LAB — ANAEROBIC AND AEROBIC CULTURE
AER RESULT: NO GROWTH
MICRO NUMBER: 91280597
MICRO NUMBER: 91280598
SPECIMEN QUALITY: ADEQUATE
SPECIMEN QUALITY:: ADEQUATE

## 2018-09-11 LAB — SYNOVIAL CELL COUNT + DIFF, W/ CRYSTALS
BASOPHILS, %: 0 %
EOSINOPHILS-SYNOVIAL: 0 % (ref 0–2)
Lymphocytes-Synovial Fld: 30 % (ref 0–74)
MONOCYTE/MACROPHAGE: 61 % (ref 0–69)
NEUTROPHIL, SYNOVIAL: 9 % (ref 0–24)
Synoviocytes, %: 0 % (ref 0–15)
WBC, SYNOVIAL: 2169 {cells}/uL — AB (ref <150)

## 2018-09-14 ENCOUNTER — Other Ambulatory Visit: Payer: PPO

## 2018-09-17 ENCOUNTER — Ambulatory Visit: Payer: PPO | Admitting: Neurology

## 2018-09-20 ENCOUNTER — Ambulatory Visit (INDEPENDENT_AMBULATORY_CARE_PROVIDER_SITE_OTHER): Payer: PPO | Admitting: Neurology

## 2018-09-20 ENCOUNTER — Encounter: Payer: Self-pay | Admitting: Neurology

## 2018-09-20 VITALS — BP 128/66 | HR 72 | Ht 60.0 in | Wt 105.6 lb

## 2018-09-20 DIAGNOSIS — I639 Cerebral infarction, unspecified: Secondary | ICD-10-CM | POA: Diagnosis not present

## 2018-09-20 NOTE — Patient Instructions (Signed)
I had a long d/w patient about her recent cryptogenicstroke, risk for recurrent stroke/TIAs, personally independently reviewed imaging studies and stroke evaluation results and answered questions.Continue aspirin 81 mg daily  for secondary stroke prevention and maintain strict control of hypertension with blood pressure goal below 130/90, diabetes with hemoglobin A1c goal below 6.5% and lipids with LDL cholesterol goal below 70 mg/dL. I also advised the patient to eat a healthy diet with plenty of whole grains, cereals, fruits and vegetables, exercise regularly and maintain ideal body weight.  The patient is planning on having elective left hip replacement surgery in [place] soon.  She is neurologically cleared for the procedure with a small but acceptable periprocedural risk of TIA/stroke.  Followup in the future with my nurse practitioner Janett Billow in 6 months or call earlier if necessary  Stroke Prevention Some medical conditions and behaviors are associated with a higher chance of having a stroke. You can help prevent a stroke by making nutrition, lifestyle, and other changes, including managing any medical conditions you may have. What nutrition changes can be made?  Eat healthy foods. You can do this by: ? Choosing foods high in fiber, such as fresh fruits and vegetables and whole grains. ? Eating at least 5 or more servings of fruits and vegetables a day. Try to fill half of your plate at each meal with fruits and vegetables. ? Choosing lean protein foods, such as lean cuts of meat, poultry without skin, fish, tofu, beans, and nuts. ? Eating low-fat dairy products. ? Avoiding foods that are high in salt (sodium). This can help lower blood pressure. ? Avoiding foods that have saturated fat, trans fat, and cholesterol. This can help prevent high cholesterol. ? Avoiding processed and premade foods.  Follow your health care provider's specific guidelines for losing weight, controlling high blood  pressure (hypertension), lowering high cholesterol, and managing diabetes. These may include: ? Reducing your daily calorie intake. ? Limiting your daily sodium intake to 1,500 milligrams (mg). ? Using only healthy fats for cooking, such as olive oil, canola oil, or sunflower oil. ? Counting your daily carbohydrate intake. What lifestyle changes can be made?  Maintain a healthy weight. Talk to your health care provider about your ideal weight.  Get at least 30 minutes of moderate physical activity at least 5 days a week. Moderate activity includes brisk walking, biking, and swimming.  Do not use any products that contain nicotine or tobacco, such as cigarettes and e-cigarettes. If you need help quitting, ask your health care provider. It may also be helpful to avoid exposure to secondhand smoke.  Limit alcohol intake to no more than 1 drink a day for nonpregnant women and 2 drinks a day for men. One drink equals 12 oz of beer, 5 oz of wine, or 1 oz of hard liquor.  Stop any illegal drug use.  Avoid taking birth control pills. Talk to your health care provider about the risks of taking birth control pills if: ? You are over 32 years old. ? You smoke. ? You get migraines. ? You have ever had a blood clot. What other changes can be made?  Manage your cholesterol levels. ? Eating a healthy diet is important for preventing high cholesterol. If cholesterol cannot be managed through diet alone, you may also need to take medicines. ? Take any prescribed medicines to control your cholesterol as told by your health care provider.  Manage your diabetes. ? Eating a healthy diet and exercising regularly are important parts  of managing your blood sugar. If your blood sugar cannot be managed through diet and exercise, you may need to take medicines. ? Take any prescribed medicines to control your diabetes as told by your health care provider.  Control your hypertension. ? To reduce your risk of  stroke, try to keep your blood pressure below 130/80. ? Eating a healthy diet and exercising regularly are an important part of controlling your blood pressure. If your blood pressure cannot be managed through diet and exercise, you may need to take medicines. ? Take any prescribed medicines to control hypertension as told by your health care provider. ? Ask your health care provider if you should monitor your blood pressure at home. ? Have your blood pressure checked every year, even if your blood pressure is normal. Blood pressure increases with age and some medical conditions.  Get evaluated for sleep disorders (sleep apnea). Talk to your health care provider about getting a sleep evaluation if you snore a lot or have excessive sleepiness.  Take over-the-counter and prescription medicines only as told by your health care provider. Aspirin or blood thinners (antiplatelets or anticoagulants) may be recommended to reduce your risk of forming blood clots that can lead to stroke.  Make sure that any other medical conditions you have, such as atrial fibrillation or atherosclerosis, are managed. What are the warning signs of a stroke? The warning signs of a stroke can be easily remembered as BEFAST.  B is for balance. Signs include: ? Dizziness. ? Loss of balance or coordination. ? Sudden trouble walking.  E is for eyes. Signs include: ? A sudden change in vision. ? Trouble seeing.  F is for face. Signs include: ? Sudden weakness or numbness of the face. ? The face or eyelid drooping to one side.  A is for arms. Signs include: ? Sudden weakness or numbness of the arm, usually on one side of the body.  S is for speech. Signs include: ? Trouble speaking (aphasia). ? Trouble understanding.  T is for time. ? These symptoms may represent a serious problem that is an emergency. Do not wait to see if the symptoms will go away. Get medical help right away. Call your local emergency services  (911 in the [place] Do not drive yourself to the hospital.  Other signs of stroke may include: ? A sudden, severe headache with no known cause. ? Nausea or vomiting. ? Seizure.  Where to find more information: For more information, visit:  American Stroke Association: www.strokeassociation.org  National Stroke Association: www.stroke.org  Summary  You can prevent a stroke by eating healthy, exercising, not smoking, limiting alcohol intake, and managing any medical conditions you may have.  Do not use any products that contain nicotine or tobacco, such as cigarettes and e-cigarettes. If you need help quitting, ask your health care provider. It may also be helpful to avoid exposure to secondhand smoke.  Remember BEFAST for warning signs of stroke. Get help right away if you or a loved one has any of these signs. This information is not intended to replace advice given to you by your health care provider. Make sure you discuss any questions you have with your health care provider. Document Released: 12/08/2004 Document Revised: 12/06/2016 Document Reviewed: 12/06/2016 Elsevier Interactive Patient Education  [employer].

## 2018-09-20 NOTE — Progress Notes (Signed)
[facility] [address] [place]. [place] [address] [phone]       OFFICE FOLLOW UP VISIT NOTE  Ms. Angeni Chaudhuri Hyer Date of Birth:  July 08, 1941 Medical Record Number:  [id number]   Referring MD:  Mali Hilty Reason for Referral:  Speech disturbance HPI: Initial consult 07/09/2018:Ms Croghan is a pleasant 77 year old Caucasian lady who seen today for initial office consultation visit for episodes of possible TIAs. History is obtained from the patient and review of electronic medical records. She states on 07/01/18 she had been working out in the yard and when she came indoors she noticed that she was having trouble speaking and was using the wrong words. This happened a couple of times and she substituted for wrong word with new immediately that she had done so. She did not struggle to speak. She denied any slurred speech. She denied any complaint headache, blurred vision, double vision, vertigo, focal extremity weakness and numbness. She was all right for several hours but later on this evening the same thing happened again and she used a few wrong words when speaking. Her husband is a retired Publishing rights manager noticed this. Has been examined her and did not sign any focal deficits to suggest a stroke. The patient had appointment to see a cardiologist Dr. Debara Pickett a few days later and mention this prompting this referral. She denies any prior history of strokes, TIAs, migraines or other neurological problems.Patient does admit that she does not drink a lot of fluids and had been working out in the yard that day and also could have been dehydrated. She has appointments next week for an echocardiogram, Holter monitor and carotid Dopplers which have not yet been done. She denies any trouble with her memory. She is independent in activities of daily living. She has no family history of dementia progressive aphasia. She has no known significant vascular risk factors xcept hypertension for which  she takes lisinopril.she has been started on aspirin 81 mg last week by Dr. Debara Pickett.she has not had any brain imaging studies done or lab work for cholesterol and diabetes checked yet Update 09/20/2018 : She returns for follow-up after last visit 2 months ago.  She is doing well without any significant recurrent speech difficulties or word finding problems.  She has had no new recurrent stroke or TIA like symptoms.  She did undergo MRI scan of the brain on 08/15/2018 which I personally reviewed shows subacute left frontal parietal wedge-shaped periventricular infarct.  MRI of the brain showed no large vessel stenosis or occlusion.  Also carotid ultrasound on 07/11/2018 had showed no significant extracranial stenosis.  LDL cholesterol on 8/26 at 19 was 99 and A1c was 4.9.  She is still on aspirin which is tolerating well without bruising or bleeding.  She is started taking Crestor now and is tolerating it well without muscle aches and pains.  She plans to have follow-up lipid profile checked in 10 days when she has her annual physical exam with her primary physician.  She states her blood pressure is quite well controlled and today it is 128/68.  The patient is bothered by chronic left hip problem and plans to undergo a complicated right triflanged procedure in [place] in a few weeks time and is requesting neurological clearance for the same. ROS:   14 system review of systems is positive for   walking difficulty only and all other systems negative  PMH:  Past Medical History:  Diagnosis Date  . Arthritis   . Heart  murmur   . Stroke [facility])     Social History:  Social History   Socioeconomic History  . Marital status: Married    Spouse name: Not on file  . Number of children: Not on file  . Years of education: Not on file  . Highest education level: Not on file  Occupational History  . Not on file  Social Needs  . Financial resource strain: Not on file  . Food insecurity:    Worry: Not on file      Inability: Not on file  . Transportation needs:    Medical: Not on file    Non-medical: Not on file  Tobacco Use  . Smoking status: Never Smoker  . Smokeless tobacco: Never Used  Substance and Sexual Activity  . Alcohol use: Yes    Alcohol/week: 1.0 standard drinks    Types: 1 Glasses of wine per week    Comment: occ.  . Drug use: No  . Sexual activity: Not on file  Lifestyle  . Physical activity:    Days per week: Not on file    Minutes per session: Not on file  . Stress: Not on file  Relationships  . Social connections:    Talks on phone: Not on file    Gets together: Not on file    Attends religious service: Not on file    Active member of club or organization: Not on file    Attends meetings of clubs or organizations: Not on file    Relationship status: Not on file  . Intimate partner violence:    Fear of current or ex partner: Not on file    Emotionally abused: Not on file    Physically abused: Not on file    Forced sexual activity: Not on file  Other Topics Concern  . Not on file  Social History Narrative  . Not on file    Medications:   Current Outpatient Medications on File Prior to Visit  Medication Sig Dispense Refill  . aspirin EC 81 MG tablet Take 81 mg by mouth daily.    . Calcium Carbonate-Vitamin D (CALCIUM + D PO) Take 1 tablet by mouth daily.     Marland Kitchen lisinopril (PRINIVIL,ZESTRIL) 10 MG tablet Take 10 mg by mouth daily.    Marland Kitchen MYRBETRIQ 50 MG TB24 Take 50 mg by mouth daily.     . rosuvastatin (CRESTOR) 5 MG tablet Take 1 tablet (5 mg total) by mouth daily. 30 tablet 1  . rosuvastatin (CRESTOR) 5 MG tablet rosuvastatin 5 mg tablet     No current facility-administered medications on file prior to visit.     Allergies:   Allergies  Allergen Reactions  . Bactrim Rash    Physical Exam General: frail pleasant elderly Caucasian lady, seated, in no evident distress Head: head normocephalic and atraumatic.   Neck: supple with no carotid or  supraclavicular bruits Cardiovascular: regular rate and rhythm, soft ejection murmur Musculoskeletal: no deformity Skin:  no rash/petichiae Vascular:  Normal pulses all extremities  Neurologic Exam Mental Status: Awake and fully alert. Oriented to place and time. Recent and remote memory intact. Attention span, concentration and fund of knowledge appropriate. Mood and affect appropriate.  No paraphasic errors or word substitutions. Intact registration, recall naming and the petition Cranial Nerves: Fundoscopic exam reveals sharp disc margins. Pupils equal, briskly reactive to light. Extraocular movements full without nystagmus. Visual fields full to confrontation. Hearing intact. Facial sensation intact. Face, tongue, palate moves normally and symmetrically.  Motor: Normal bulk and tone. Normal strength in all tested extremity muscles. Sensory.: intact to touch , pinprick , position and vibratory sensation.  Coordination: Rapid alternating movements normal in all extremities. Finger-to-nose and heel-to-shin performed accurately bilaterally. Gait and Station: Arises from chair without difficulty. Stance is normal. Gait demonstrates favoring of left hip due to pain.  Reflexes: 1+ and symmetric. Toes downgoing.   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  Vascular risk factors of hypertension, hyperlipidemia and age     PLAN: I had a long d/w patient about her recent cryptogenicstroke, risk for recurrent stroke/TIAs, personally independently reviewed imaging studies and stroke evaluation results and answered questions.Continue aspirin 81 mg daily  for secondary stroke prevention and maintain strict control of hypertension with blood pressure goal below 130/90, diabetes with hemoglobin A1c goal below 6.5% and lipids with LDL cholesterol goal below 70 mg/dL. I also advised the patient  to eat a healthy diet with plenty of whole grains, cereals, fruits and vegetables, exercise regularly and maintain ideal body weight.  The patient is planning on having elective left hip replacement surgery in [place] soon.  She is neurologically cleared for the procedure with a small but acceptable periprocedural risk of TIA/stroke.  Followup in the future with my nurse practitioner Janett Billow in 6 months or call earlier if necessary.Greater than 50% time during this 25 minute  visit was spent on counseling and coordination of care about her episode of speech disturbance, TIA and answering questions   Antony Contras, MD  [facility] [address] [place], [place] [address]  Phone [phone] Fax [phone] Note: This document was prepared with digital dictation and possible smart phrase technology. Any transcriptional errors that result from this process are unintentional.

## 2018-09-25 ENCOUNTER — Ambulatory Visit (INDEPENDENT_AMBULATORY_CARE_PROVIDER_SITE_OTHER): Payer: PPO | Admitting: *Deleted

## 2018-09-25 DIAGNOSIS — I639 Cerebral infarction, unspecified: Secondary | ICD-10-CM

## 2018-09-25 NOTE — Progress Notes (Signed)
Carelink Summary Report / Loop Recorder 

## 2018-10-01 DIAGNOSIS — E781 Pure hyperglyceridemia: Secondary | ICD-10-CM | POA: Diagnosis not present

## 2018-10-01 DIAGNOSIS — R82998 Other abnormal findings in urine: Secondary | ICD-10-CM | POA: Diagnosis not present

## 2018-10-01 DIAGNOSIS — D6489 Other specified anemias: Secondary | ICD-10-CM | POA: Diagnosis not present

## 2018-10-01 DIAGNOSIS — M859 Disorder of bone density and structure, unspecified: Secondary | ICD-10-CM | POA: Diagnosis not present

## 2018-10-03 DIAGNOSIS — M160 Bilateral primary osteoarthritis of hip: Secondary | ICD-10-CM | POA: Diagnosis not present

## 2018-10-03 DIAGNOSIS — Z96643 Presence of artificial hip joint, bilateral: Secondary | ICD-10-CM | POA: Diagnosis not present

## 2018-10-03 DIAGNOSIS — Z471 Aftercare following joint replacement surgery: Secondary | ICD-10-CM | POA: Diagnosis not present

## 2018-10-05 DIAGNOSIS — Z1212 Encounter for screening for malignant neoplasm of rectum: Secondary | ICD-10-CM | POA: Diagnosis not present

## 2018-10-08 DIAGNOSIS — Z8673 Personal history of transient ischemic attack (TIA), and cerebral infarction without residual deficits: Secondary | ICD-10-CM | POA: Diagnosis not present

## 2018-10-08 DIAGNOSIS — Z6821 Body mass index (BMI) 21.0-21.9, adult: Secondary | ICD-10-CM | POA: Diagnosis not present

## 2018-10-08 DIAGNOSIS — N3281 Overactive bladder: Secondary | ICD-10-CM | POA: Diagnosis not present

## 2018-10-08 DIAGNOSIS — I519 Heart disease, unspecified: Secondary | ICD-10-CM | POA: Diagnosis not present

## 2018-10-08 DIAGNOSIS — Z0000 Encounter for general adult medical examination without abnormal findings: Secondary | ICD-10-CM | POA: Diagnosis not present

## 2018-10-08 DIAGNOSIS — T8450XA Infection and inflammatory reaction due to unspecified internal joint prosthesis, initial encounter: Secondary | ICD-10-CM | POA: Diagnosis not present

## 2018-10-08 DIAGNOSIS — Z1389 Encounter for screening for other disorder: Secondary | ICD-10-CM | POA: Diagnosis not present

## 2018-10-08 DIAGNOSIS — D6489 Other specified anemias: Secondary | ICD-10-CM | POA: Diagnosis not present

## 2018-10-08 DIAGNOSIS — R030 Elevated blood-pressure reading, without diagnosis of hypertension: Secondary | ICD-10-CM | POA: Diagnosis not present

## 2018-10-08 DIAGNOSIS — M859 Disorder of bone density and structure, unspecified: Secondary | ICD-10-CM | POA: Diagnosis not present

## 2018-10-08 DIAGNOSIS — E7849 Other hyperlipidemia: Secondary | ICD-10-CM | POA: Diagnosis not present

## 2018-10-18 ENCOUNTER — Other Ambulatory Visit: Payer: Self-pay

## 2018-10-18 MED ORDER — ROSUVASTATIN CALCIUM 5 MG PO TABS: 5.0000 mg | ORAL_TABLET | Freq: Every day | ORAL | 1 refills | Status: DC

## 2018-10-23 DIAGNOSIS — Z471 Aftercare following joint replacement surgery: Secondary | ICD-10-CM | POA: Diagnosis not present

## 2018-10-29 ENCOUNTER — Ambulatory Visit (INDEPENDENT_AMBULATORY_CARE_PROVIDER_SITE_OTHER): Payer: PPO

## 2018-10-29 DIAGNOSIS — I639 Cerebral infarction, unspecified: Secondary | ICD-10-CM | POA: Diagnosis not present

## 2018-10-30 NOTE — Progress Notes (Signed)
Carelink Summary Report / Loop Recorder 

## 2018-11-01 DIAGNOSIS — R351 Nocturia: Secondary | ICD-10-CM | POA: Diagnosis not present

## 2018-11-01 DIAGNOSIS — N3281 Overactive bladder: Secondary | ICD-10-CM | POA: Diagnosis not present

## 2018-11-12 DIAGNOSIS — Z01818 Encounter for other preprocedural examination: Secondary | ICD-10-CM | POA: Diagnosis not present

## 2018-11-12 DIAGNOSIS — M25552 Pain in left hip: Secondary | ICD-10-CM | POA: Diagnosis not present

## 2018-11-15 LAB — CUP PACEART REMOTE DEVICE CHECK
Implantable Pulse Generator Implant Date: 20191010
MDC IDC SESS DTM: 20191112130724

## 2018-11-16 DIAGNOSIS — Z0000 Encounter for general adult medical examination without abnormal findings: Secondary | ICD-10-CM | POA: Diagnosis not present

## 2018-11-27 ENCOUNTER — Telehealth: Payer: Self-pay | Admitting: Internal Medicine

## 2018-11-27 ENCOUNTER — Encounter: Payer: Self-pay | Admitting: Internal Medicine

## 2018-11-27 ENCOUNTER — Ambulatory Visit: Payer: PPO | Admitting: Internal Medicine

## 2018-11-27 VITALS — BP 128/64 | HR 71 | Ht 60.0 in | Wt 103.0 lb

## 2018-11-27 DIAGNOSIS — I639 Cerebral infarction, unspecified: Secondary | ICD-10-CM | POA: Diagnosis not present

## 2018-11-27 DIAGNOSIS — R002 Palpitations: Secondary | ICD-10-CM

## 2018-11-27 DIAGNOSIS — Z01810 Encounter for preprocedural cardiovascular examination: Secondary | ICD-10-CM | POA: Diagnosis not present

## 2018-11-27 NOTE — Patient Instructions (Signed)
Medication Instructions:  Continue current medications If you need a refill on your cardiac medications before your next appointment, please call your pharmacy.   Follow-Up: At [facility], you and your health needs are our priority.  As part of our continuing mission to provide you with exceptional heart care, we have created designated Provider Care Teams.  These Care Teams include your primary Cardiologist (physician) and Advanced Practice Providers (APPs -  Physician Assistants and Nurse Practitioners) who all work together to provide you with the care you need, when you need it. You will need a follow up appointment in 6 months.  Please call our office 2 months in advance to schedule this appointment.  You may see Dr. Hilty or one of the following Advanced Practice Providers on your designated Care Team: Hao Meng, PA-C . Angela Duke, PA-C  Any Other Special Instructions Will Be Listed Below (If Applicable).    

## 2018-11-27 NOTE — Progress Notes (Signed)
OFFICE NOTE  Chief Complaint:  Preop risk evaluation  Primary Care Physician: Marton Redwood, MD  HPI:  [name] is a pleasant 78 year old female who was previously followed by Dr. Rex Kras. I last saw her in April 2014 for follow-up of a small membranous VSD. She also has very mild pulmonary hypertension. She does have of course a loud murmur which is associated with small VSDs. Her last echo was in 13 and EF was 55% or greater at the time. Normal wall motion was noted. There was mild mitral annular calcification with mild to moderate mitral regurgitation. There was mild tricuspid regurgitation as well. She is overall asymptomatic. She denies chest pain or worsening shortness of breath. She is on limited medications including aspirin, calcium and Myrbetriq.  03/21/2016  [name] returns today for follow-up. She's done extremely well. 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 In the past she's had mild pulmonary hypertension however her echo last year was not able to estimate pulmonary pressures. Her EF is remains stable. Overall she is doing well without any symptoms of shortness of breath or exercise intolerance. She denies any chest pain. She does have lower extremity varicose veins and has a history of vein stripping in the past. She occasionally gets some swelling of her legs.  04/11/2018  [name] was seen today in follow-up.'s been 2 years since I last saw her.  She has a stable small membranous VSD.  EF was unchanged by echo in 2017.  She denies worsening shortness of breath or chest pain or any change in exercise capacity.  She still struggles with varicose veins.  She also has some toe deformity on the left foot and is contemplating surgery.  She rarely gets swelling of her legs.  Interestingly her blood pressure has been elevated.  It was notably high in her PCPs office in December at 181/90.  She also saw her GYN who noted her blood pressure  was elevated.  In addition her blood pressure is elevated here.  She has a list of blood pressure she took at home which I reviewed.  While there are occasional blood pressures in the low 100s in general they are between 921-194 systolic.  Overall, I feel that she may be developing some hypertension, possibly due to arterial sclerosis or aging.  Her PCP had considered treating her blood pressure.  I think it is time for her to be treated.  Labs reviewed from November 2018 showed total cholesterol 205, HDL 79, LDL 114 and triglycerides 60.  07/03/2018  [name] returns today for follow-up.  She called in over the weekend noting that she had had some recent speech difficulty.  Her speech was garbled and it was noted by her husband [name] Schultze, MD who is a retired Publishing rights manager.  The episode was transient although very upsetting for her.  She had been on aspirin in the past but has not recently taken it.  In fact she is only on a few medications.  She is compliant with lisinopril and has seen a marked improvement in blood pressure 136/60.  According to her husband who did a brief neurologic exam she had no focal neurologic deficits.  She does have a history of VSD but no known atrial septal defect or PFO.  In addition she had mild to moderate bilateral carotid artery disease by ultrasound in 2016.  Denies any palpitations or arrhythmias that she is aware of.  08/20/2018  [name] returns today for follow-up.  She underwent 30-day monitoring which demonstrated a short run of tachycardia which was mildly irregular concerning for possible A. fib however duration was less than a few seconds.  She did not have any other episodes.  She did have an MRI which showed a hypointense lesion in the left parietal area suggestive of possible subacute stroke.  This may fit with her symptom pattern.  She is currently on aspirin however I am highly suspicious that this may have been a cardioembolic event.  I agree with her  neurologist recommendation to start Crestor 5 mg with goal LDL less than 70.  Based on the fact that there was very infrequent A. fib, there is not enough evidence to consider starting long-term anticoagulation at this point.  However due to my increased suspicions, I think she is a good candidate for an implanted loop recorder.  11/27/2018  [name] is seen today for routine follow-up as well as preoperative risk assessment.  She is doing well without any obvious recurrence of atrial fibrillation.  She did have an implanted loop recorder placed and to interrogation so far not demonstrated any A. fib.  She has been struggling with hip pain and is contemplating surgery in the next few weeks by Dr. Cornelia Copa and Baldo Ash.  From a cardiac standpoint I feel that she is acceptable risk for surgery.  We did perform an EKG today which shows normal sinus rhythm.  She was asked to hold her aspirin prior to the procedure and I would recommend holding it for a minimum of 7 days.  PMHx:  Past Medical History:  Diagnosis Date  . Arthritis   . Heart murmur   . Stroke [facility])     Past Surgical History:  Procedure Laterality Date  . ABDOMINAL HYSTERECTOMY     age 46  . APPENDECTOMY    . DOPPLER ECHOCARDIOGRAPHY  2013  . Duplex Doppler  2013  . EXCISIONAL TOTAL HIP ARTHROPLASTY WITH ANTIBIOTIC SPACERS  09/25/2012   Procedure: EXCISIONAL TOTAL HIP ARTHROPLASTY WITH ANTIBIOTIC SPACERS;  Surgeon: [name], MD;  Location: [facility];  Service: Orthopedics;  Laterality: Left;  RESECTION LEFT TOTAL HIP ARTHROPLASTY AND PLACEMENT OF ANTIBIOTIC SPACERs  . EYE SURGERY  02/2010   bilateral cataracts  . 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 JOINT REPLACEMENT  2006   left hip, revision  2009 and 07/2011  . JOINT REPLACEMENT  10/2007   right hip, revision 12/2008  . LOOP RECORDER INSERTION N/A 08/23/2018   Procedure: LOOP RECORDER INSERTION;  Surgeon: [name], MD;  Location: [facility];  Service: Cardiovascular;  Laterality: N/A;    FAMHx:  Family History  Problem Relation Age of Onset  . Failure to thrive Mother   . Stroke Father     SOCHx:   reports that she has never smoked. She has never used smokeless tobacco. She reports current alcohol use of about 1.0 standard drinks of alcohol per week. She reports that she does not use drugs.  ALLERGIES:  Allergies  Allergen Reactions  . Bactrim Rash    ROS: Pertinent items noted in HPI and remainder of comprehensive ROS otherwise negative.  HOME MEDS: Current Outpatient Medications  Medication Sig Dispense Refill  . aspirin  EC 81 MG tablet Take 81 mg by mouth daily.    . Calcium Carbonate-Vitamin D (CALCIUM + D PO) Take 1 tablet by mouth daily.     Marland Kitchen lisinopril (PRINIVIL,ZESTRIL) 10 MG tablet Take 10 mg by mouth daily.    Marland Kitchen MYRBETRIQ 50 MG TB24 Take 50 mg by mouth daily.     . rosuvastatin (CRESTOR) 5 MG tablet rosuvastatin 5 mg tablet     No current facility-administered medications for this visit.     LABS/IMAGING: No results found for this or any previous visit (from the past 48 hour(s)). No results found.  WEIGHTS: Wt Readings from Last 3 Encounters:  11/27/18 103 lb (46.7 kg)  09/20/18 105 lb 9.6 oz (47.9 kg)  08/23/18 105 lb (47.6 kg)    VITALS: BP 128/64 (BP Location: Left Arm, Patient Position: Sitting, Cuff Size: Normal)   Pulse 71   Ht 5' (1.524 m)   Wt 103 lb (46.7 kg)   BMI 20.12 kg/m   EXAM: General appearance: alert and no distress Neck: no carotid bruit, no JVD and thyroid not enlarged, symmetric, no tenderness/mass/nodules Lungs: clear to auscultation bilaterally Heart: regular rate and rhythm Abdomen: soft, non-tender; bowel sounds normal; no masses,  no  organomegaly Extremities: extremities normal, atraumatic, no cyanosis or edema Pulses: 2+ and symmetric Skin: Skin color, texture, turgor normal. No rashes or lesions Neurologic: Grossly normal Psych: Pleasant  EKG: Normal sinus rhythm at 71 personally reviewed  ASSESSMENT: 1. Acceptable risk for upcoming hip surgery 2. Stroke 3. Essential hypertension 4. Small perimembranous VSD 5. Mild pulmonary hypertension 6. Varicose veins  PLAN: 1.   [name] is at acceptable risk for upcoming hip surgery.  She is advised to hold her aspirin if necessary for 7 days prior to the procedure and restart afterwards.  Blood pressure is well controlled.  Her EKG shows sinus rhythm today.  Her implanted loop recorder is failed to show any atrial fibrillation which is good.  We will continue to monitor that.  Plan follow-up with me in 6 months.  Pixie Casino, MD, [facility] [place] Director of the Advanced Lipid Disorders &   Cardiovascular Risk Reduction Clinic Diplomate of the American Board of Clinical Lipidology Attending Cardiologist  Direct Dial: [phone]  Fax: [phone]  Website:  [email or web address] 11/27/2018, 10:38 AM

## 2018-11-27 NOTE — Telephone Encounter (Signed)
Clearance for left THA REV faxed to Dr. Zannie Cove [facility] [phone]) - MD note & EKG

## 2018-11-30 ENCOUNTER — Ambulatory Visit (INDEPENDENT_AMBULATORY_CARE_PROVIDER_SITE_OTHER): Payer: PPO

## 2018-11-30 DIAGNOSIS — I639 Cerebral infarction, unspecified: Secondary | ICD-10-CM

## 2018-12-01 LAB — CUP PACEART REMOTE DEVICE CHECK
Implantable Pulse Generator Implant Date: 20191010
MDC IDC SESS DTM: 20200117134140

## 2018-12-02 LAB — CUP PACEART REMOTE DEVICE CHECK
Implantable Pulse Generator Implant Date: 20191010
MDC IDC SESS DTM: 20191215130748

## 2018-12-03 NOTE — Progress Notes (Signed)
Carelink Summary Report / Loop Recorder 

## 2018-12-18 DIAGNOSIS — Z961 Presence of intraocular lens: Secondary | ICD-10-CM | POA: Diagnosis not present

## 2018-12-18 DIAGNOSIS — H5213 Myopia, bilateral: Secondary | ICD-10-CM | POA: Diagnosis not present

## 2018-12-18 DIAGNOSIS — H52203 Unspecified astigmatism, bilateral: Secondary | ICD-10-CM | POA: Diagnosis not present

## 2018-12-18 DIAGNOSIS — H524 Presbyopia: Secondary | ICD-10-CM | POA: Diagnosis not present

## 2018-12-31 DIAGNOSIS — T859XXA Unspecified complication of internal prosthetic device, implant and graft, initial encounter: Secondary | ICD-10-CM | POA: Diagnosis not present

## 2018-12-31 DIAGNOSIS — T84091A Other mechanical complication of internal left hip prosthesis, initial encounter: Secondary | ICD-10-CM | POA: Diagnosis not present

## 2018-12-31 DIAGNOSIS — Z471 Aftercare following joint replacement surgery: Secondary | ICD-10-CM | POA: Diagnosis not present

## 2018-12-31 DIAGNOSIS — M419 Scoliosis, unspecified: Secondary | ICD-10-CM | POA: Diagnosis not present

## 2018-12-31 DIAGNOSIS — Z4732 Aftercare following explantation of hip joint prosthesis: Secondary | ICD-10-CM | POA: Diagnosis not present

## 2018-12-31 DIAGNOSIS — Z8673 Personal history of transient ischemic attack (TIA), and cerebral infarction without residual deficits: Secondary | ICD-10-CM | POA: Diagnosis not present

## 2018-12-31 DIAGNOSIS — M25552 Pain in left hip: Secondary | ICD-10-CM | POA: Diagnosis not present

## 2018-12-31 DIAGNOSIS — M89552 Osteolysis, left thigh: Secondary | ICD-10-CM | POA: Diagnosis not present

## 2018-12-31 DIAGNOSIS — Z96642 Presence of left artificial hip joint: Secondary | ICD-10-CM | POA: Diagnosis not present

## 2019-01-02 ENCOUNTER — Ambulatory Visit (INDEPENDENT_AMBULATORY_CARE_PROVIDER_SITE_OTHER): Payer: PPO

## 2019-01-02 DIAGNOSIS — I639 Cerebral infarction, unspecified: Secondary | ICD-10-CM

## 2019-01-03 LAB — CUP PACEART REMOTE DEVICE CHECK
Date Time Interrogation Session: 20200219151101
MDC IDC PG IMPLANT DT: 20191010

## 2019-01-10 NOTE — Progress Notes (Signed)
Carelink Summary Report / Loop Recorder 

## 2019-01-15 DIAGNOSIS — M25552 Pain in left hip: Secondary | ICD-10-CM | POA: Diagnosis not present

## 2019-02-04 ENCOUNTER — Other Ambulatory Visit: Payer: Self-pay

## 2019-02-04 ENCOUNTER — Ambulatory Visit (INDEPENDENT_AMBULATORY_CARE_PROVIDER_SITE_OTHER): Payer: PPO | Admitting: *Deleted

## 2019-02-04 DIAGNOSIS — R002 Palpitations: Secondary | ICD-10-CM

## 2019-02-04 LAB — CUP PACEART REMOTE DEVICE CHECK
MDC IDC PG IMPLANT DT: 20191010
MDC IDC SESS DTM: 20200323164219

## 2019-02-07 ENCOUNTER — Telehealth: Payer: Self-pay | Admitting: Cardiology

## 2019-02-07 NOTE — Telephone Encounter (Signed)
Patient called and requested information about her ILR summary report results from 02/04/2019. I informed her that I would get a Device Tech RN to return call her. Pt verbalized understanding and requested to be called at her home number (listed in the chart) after 2:00 PM.

## 2019-02-08 NOTE — Telephone Encounter (Signed)
Spoke with patient, answered her questions about recent summary report. Explained no real arrhythmia episodes. Pt verbalizes understanding and is aware we will call if any true arrhythmias are noted. She thanked me for my call and denies additional questions at this time.

## 2019-02-12 NOTE — Progress Notes (Signed)
Carelink Summary Report / Loop Recorder 

## 2019-03-11 ENCOUNTER — Ambulatory Visit (INDEPENDENT_AMBULATORY_CARE_PROVIDER_SITE_OTHER): Payer: PPO | Admitting: *Deleted

## 2019-03-11 ENCOUNTER — Other Ambulatory Visit: Payer: Self-pay

## 2019-03-11 DIAGNOSIS — R002 Palpitations: Secondary | ICD-10-CM | POA: Diagnosis not present

## 2019-03-11 DIAGNOSIS — I639 Cerebral infarction, unspecified: Secondary | ICD-10-CM

## 2019-03-11 LAB — CUP PACEART REMOTE DEVICE CHECK
Date Time Interrogation Session: 20200425173655
Implantable Pulse Generator Implant Date: 20191010

## 2019-03-18 NOTE — Progress Notes (Signed)
Carelink Summary Report / Loop Recorder 

## 2019-03-21 ENCOUNTER — Ambulatory Visit: Payer: PPO | Admitting: Adult Health

## 2019-04-09 MED ORDER — LISINOPRIL 10 MG PO TABS: 10.0000 mg | ORAL_TABLET | Freq: Every day | ORAL | 3 refills | Status: DC

## 2019-04-09 MED ORDER — ROSUVASTATIN CALCIUM 5 MG PO TABS: 5.0000 mg | ORAL_TABLET | Freq: Every day | ORAL | 3 refills | Status: DC

## 2019-04-11 ENCOUNTER — Ambulatory Visit (INDEPENDENT_AMBULATORY_CARE_PROVIDER_SITE_OTHER): Payer: PPO | Admitting: *Deleted

## 2019-04-11 DIAGNOSIS — I639 Cerebral infarction, unspecified: Secondary | ICD-10-CM | POA: Diagnosis not present

## 2019-04-11 LAB — CUP PACEART REMOTE DEVICE CHECK
Date Time Interrogation Session: 20200528154803
Implantable Pulse Generator Implant Date: 20191010

## 2019-04-11 MED ORDER — LISINOPRIL 10 MG PO TABS: 10.0000 mg | ORAL_TABLET | Freq: Every day | ORAL | 3 refills | Status: DC

## 2019-04-11 MED ORDER — ROSUVASTATIN CALCIUM 5 MG PO TABS: 5.0000 mg | ORAL_TABLET | Freq: Every day | ORAL | 3 refills | Status: DC

## 2019-04-16 DIAGNOSIS — D1801 Hemangioma of skin and subcutaneous tissue: Secondary | ICD-10-CM | POA: Diagnosis not present

## 2019-04-16 DIAGNOSIS — L821 Other seborrheic keratosis: Secondary | ICD-10-CM | POA: Diagnosis not present

## 2019-04-16 DIAGNOSIS — Z85828 Personal history of other malignant neoplasm of skin: Secondary | ICD-10-CM | POA: Diagnosis not present

## 2019-04-16 DIAGNOSIS — D692 Other nonthrombocytopenic purpura: Secondary | ICD-10-CM | POA: Diagnosis not present

## 2019-04-16 DIAGNOSIS — L570 Actinic keratosis: Secondary | ICD-10-CM | POA: Diagnosis not present

## 2019-04-16 DIAGNOSIS — L578 Other skin changes due to chronic exposure to nonionizing radiation: Secondary | ICD-10-CM | POA: Diagnosis not present

## 2019-04-16 DIAGNOSIS — L812 Freckles: Secondary | ICD-10-CM | POA: Diagnosis not present

## 2019-04-18 NOTE — Progress Notes (Signed)
Carelink Summary Report / Loop Recorder 

## 2019-05-14 ENCOUNTER — Ambulatory Visit (INDEPENDENT_AMBULATORY_CARE_PROVIDER_SITE_OTHER): Payer: PPO | Admitting: *Deleted

## 2019-05-14 DIAGNOSIS — I639 Cerebral infarction, unspecified: Secondary | ICD-10-CM

## 2019-05-14 LAB — CUP PACEART REMOTE DEVICE CHECK
Date Time Interrogation Session: 20200630174158
Implantable Pulse Generator Implant Date: 20191010

## 2019-05-25 NOTE — Progress Notes (Signed)
Carelink Summary Report / Loop Recorder 

## 2019-05-27 ENCOUNTER — Telehealth: Payer: Self-pay | Admitting: Internal Medicine

## 2019-05-27 DIAGNOSIS — Z96641 Presence of right artificial hip joint: Secondary | ICD-10-CM | POA: Diagnosis not present

## 2019-05-27 DIAGNOSIS — Z96643 Presence of artificial hip joint, bilateral: Secondary | ICD-10-CM | POA: Diagnosis not present

## 2019-05-27 DIAGNOSIS — Z471 Aftercare following joint replacement surgery: Secondary | ICD-10-CM | POA: Diagnosis not present

## 2019-05-27 DIAGNOSIS — Z96642 Presence of left artificial hip joint: Secondary | ICD-10-CM | POA: Diagnosis not present

## 2019-05-27 NOTE — Telephone Encounter (Signed)
I called [name] to confirm appt with Dr Debara Pickett on 05-28-19.

## 2019-05-28 ENCOUNTER — Telehealth (INDEPENDENT_AMBULATORY_CARE_PROVIDER_SITE_OTHER): Payer: PPO | Admitting: Internal Medicine

## 2019-05-28 ENCOUNTER — Telehealth: Payer: Self-pay

## 2019-05-28 ENCOUNTER — Encounter: Payer: Self-pay | Admitting: Internal Medicine

## 2019-05-28 VITALS — BP 106/64 | HR 65 | Ht 60.0 in | Wt 102.0 lb

## 2019-05-28 DIAGNOSIS — Q210 Ventricular septal defect: Secondary | ICD-10-CM

## 2019-05-28 DIAGNOSIS — E7800 Pure hypercholesterolemia, unspecified: Secondary | ICD-10-CM

## 2019-05-28 DIAGNOSIS — I639 Cerebral infarction, unspecified: Secondary | ICD-10-CM | POA: Diagnosis not present

## 2019-05-28 DIAGNOSIS — I10 Essential (primary) hypertension: Secondary | ICD-10-CM | POA: Diagnosis not present

## 2019-05-28 DIAGNOSIS — R002 Palpitations: Secondary | ICD-10-CM

## 2019-05-28 NOTE — Progress Notes (Signed)
Virtual Visit via Video Note   This visit type was conducted due to national recommendations for restrictions regarding the COVID-19 Pandemic (e.g. social distancing) in an effort to limit this patient's exposure and mitigate transmission in our community.  Due to her co-morbid illnesses, this patient is at least at moderate risk for complications without adequate follow up.  This format is felt to be most appropriate for this patient at this time.  All issues noted in this document were discussed and addressed.  A limited physical exam was performed with this format.  Please refer to the patient's chart for her consent to telehealth for [name].   Evaluation Performed:  Doxy.me video visit  Date:  05/28/2019   ID:  [name], DOB 07-31-1941, MRN [id number]  Patient Location:  [address] [place] [address]  Provider location:   [address] [place], [place] [address]  PCP:  [name] Redwood, MD  Cardiologist:  [name] Casino, MD Electrophysiologist:  None   Chief Complaint:  No complaints  History of Present Illness:    [name] is a 78 y.o. female who presents via audio/video conferencing for a telehealth visit today.  K was seen today for video follow-up.  Overall she is doing well.  She denies any chest pain or worsening shortness of breath.  In January I cleared her for hip surgery which she successfully had in February.  She has been slow to recover but otherwise improved in her walking.  She still using a cane for balance but she is improving.  She saw [name], who felt that she was making good progress.  She continues to have had no recurrent stroke or TIA.  She has a loop recorder in place and routine checks of the device have failed to show any significant arrhythmias.  Her blood pressure is well controlled.  Overall she feels well.  The patient does not have symptoms concerning for COVID-19 infection (fever, chills, cough, or new  SHORTNESS OF BREATH).    Prior CV studies:   The following studies were reviewed today:  Chart reviewed  PMHx:  Past Medical History:  Diagnosis Date  . Arthritis   . Heart murmur   . Stroke [facility])     Past Surgical History:  Procedure Laterality Date  . ABDOMINAL HYSTERECTOMY     age 78  . APPENDECTOMY    . DOPPLER ECHOCARDIOGRAPHY  2013  . Duplex Doppler  2013  . EXCISIONAL TOTAL HIP ARTHROPLASTY WITH ANTIBIOTIC SPACERS  09/25/2012   Procedure: EXCISIONAL TOTAL HIP ARTHROPLASTY WITH ANTIBIOTIC SPACERS;  Surgeon: [name], MD;  Location: [facility];  Service: Orthopedics;  Laterality: Left;  RESECTION LEFT TOTAL HIP ARTHROPLASTY AND PLACEMENT OF ANTIBIOTIC SPACERs  . EYE SURGERY  02/2010   bilateral cataracts  . 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 JOINT REPLACEMENT  2006   left hip, revision 2009 and 07/2011  . JOINT REPLACEMENT  10/2007   right hip, revision 12/2008  . LOOP RECORDER INSERTION N/A 08/23/2018   Procedure: LOOP RECORDER INSERTION;  Surgeon: [name], MD;  Location: [place] [facility];  Service: Cardiovascular;  Laterality: N/A;    FAMHx:  Family History  Problem Relation Age of Onset  . Failure  to thrive Mother   . Stroke Father     SOCHx:   reports that she has never smoked. She has never used smokeless tobacco. She reports current alcohol use of about 1.0 standard drinks of alcohol per week. She reports that she does not use drugs.  ALLERGIES:  Allergies  Allergen Reactions  . Bactrim Rash    MEDS:  Current Meds  Medication Sig  . aspirin EC 81 MG tablet Take 81 mg by mouth daily.  . Calcium Carbonate-Vitamin D (CALCIUM + D PO) Take 1 tablet by mouth daily.   Marland Kitchen lisinopril (ZESTRIL) 10 MG tablet Take 1  tablet (10 mg total) by mouth daily.  Marland Kitchen MYRBETRIQ 50 MG TB24 Take 50 mg by mouth daily.   . rosuvastatin (CRESTOR) 5 MG tablet Take 1 tablet (5 mg total) by mouth daily.     ROS: A comprehensive review of systems was negative.  Labs/Other Tests and Data Reviewed:    Recent Labs: No results found for requested labs within last 8760 hours.   Recent Lipid Panel Lab Results  Component Value Date/Time   CHOL 197 07/09/2018 01:43 PM   TRIG 76 07/09/2018 01:43 PM   HDL 83 07/09/2018 01:43 PM   CHOLHDL 2.4 07/09/2018 01:43 PM   LDLCALC 99 07/09/2018 01:43 PM    Wt Readings from Last 3 Encounters:  05/28/19 102 lb (46.3 kg)  11/27/18 103 lb (46.7 kg)  09/20/18 105 lb 9.6 oz (47.9 kg)     Exam:    Vital Signs:  BP 106/64   Pulse 65   Ht 5' (1.524 m)   Wt 102 lb (46.3 kg)   BMI 19.92 kg/m    General appearance: alert and no distress Lungs: No visual respiratory difficulty Abdomen: soft, non-tender; bowel sounds normal; no masses,  no organomegaly Extremities: extremities normal, atraumatic, no cyanosis or edema Skin: Skin color, texture, turgor normal. No rashes or lesions Neurologic: Mental status: Alert, oriented, thought content appropriate Psych: Pleasant  ASSESSMENT & PLAN:    1. History of cryptogenic stroke 2. Status post implanted loop recorder 3. Essential hypertension 4. Small perimembranous VSD  [name] continues to do well.  She has a low normal blood pressure on lisinopril but is asymptomatic with this.  I will not reduce the dose at this point but advised her to monitor and if she sees blood pressures less than 782 systolic, we could consider decreasing her medicine.  She is also on statin and with a goal LDL less than 70.  Her most recent LDL was 88 and she will have a repeat lipid profile with her PCP in November.  She may need an increase in her medication.  Otherwise no changes were made today.  Plan follow-up with me in 6 months or sooner as necessary.   COVID-19 Education: The signs and symptoms of COVID-19 were discussed with the patient and how to seek care for testing (follow up with PCP or arrange E-visit).  The importance of social distancing was discussed today.  Patient Risk:   After full review of this patients clinical status, I feel that they are at least moderate risk at this time.  Time:   Today, I have spent 25 minutes with the patient with telehealth technology discussing dyslipidemia, cryptogenic stroke, implanted loop recorder, hypertension, recent hip surgery.     Medication Adjustments/Labs and Tests Ordered: Current medicines are reviewed at length with the patient today.  Concerns regarding medicines are outlined above.   Tests Ordered:  No orders of the defined types were placed in this encounter.   Medication Changes: No orders of the defined types were placed in this encounter.   Disposition:  in 6 month(s)  [name] Casino, MD, [facility], [place] Director of the Advanced Lipid Disorders &  Cardiovascular Risk Reduction Clinic Diplomate of the American Board of Clinical Lipidology Attending Cardiologist  Direct Dial: [phone]  Fax: [phone]  Website:  [email or web address]  [name] Casino, MD  05/28/2019 10:28 AM

## 2019-05-28 NOTE — Patient Instructions (Signed)
Medication Instructions:  Your physician recommends that you continue on your current medications as directed. Please refer to the Current Medication list given to you today. u If you need a refill on your cardiac medications before your next appointment, please call your pharmacy.    Follow-Up: At [facility], you and your health needs are our priority.  As part of our continuing mission to provide you with exceptional heart care, we have created designated Provider Care Teams.  These Care Teams include your primary Cardiologist (physician) and Advanced Practice Providers (APPs -  Physician Assistants and Nurse Practitioners) who all work together to provide you with the care you need, when you need it. You will need a follow up appointment in 6 months.  Please call our office 2 months in advance to schedule this appointment.  You may see Pixie Casino, MD or one of the following Advanced Practice Providers on your designated Care Team: [place], [place] . Fabian Sharp, PA-C  Any Other Special Instructions Will Be Listed Below (If Applicable).

## 2019-05-28 NOTE — Telephone Encounter (Signed)

## 2019-06-16 LAB — CUP PACEART REMOTE DEVICE CHECK
Date Time Interrogation Session: 20200802181047
Implantable Pulse Generator Implant Date: 20191010

## 2019-06-17 ENCOUNTER — Ambulatory Visit (INDEPENDENT_AMBULATORY_CARE_PROVIDER_SITE_OTHER): Payer: PPO | Admitting: *Deleted

## 2019-06-17 DIAGNOSIS — I639 Cerebral infarction, unspecified: Secondary | ICD-10-CM

## 2019-06-26 NOTE — Progress Notes (Signed)
Carelink Summary Report / Loop Recorder 

## 2019-07-19 ENCOUNTER — Ambulatory Visit (INDEPENDENT_AMBULATORY_CARE_PROVIDER_SITE_OTHER): Payer: PPO | Admitting: *Deleted

## 2019-07-19 DIAGNOSIS — I639 Cerebral infarction, unspecified: Secondary | ICD-10-CM | POA: Diagnosis not present

## 2019-07-19 LAB — CUP PACEART REMOTE DEVICE CHECK
Date Time Interrogation Session: 20200904180807
Implantable Pulse Generator Implant Date: 20191010

## 2019-07-25 DIAGNOSIS — M13849 Other specified arthritis, unspecified hand: Secondary | ICD-10-CM | POA: Diagnosis not present

## 2019-07-25 DIAGNOSIS — M79642 Pain in left hand: Secondary | ICD-10-CM | POA: Diagnosis not present

## 2019-07-25 DIAGNOSIS — M79641 Pain in right hand: Secondary | ICD-10-CM | POA: Diagnosis not present

## 2019-07-31 NOTE — Progress Notes (Signed)
Carelink Summary Report / Loop Recorder 

## 2019-08-19 DIAGNOSIS — Z1231 Encounter for screening mammogram for malignant neoplasm of breast: Secondary | ICD-10-CM | POA: Diagnosis not present

## 2019-08-19 DIAGNOSIS — Z6821 Body mass index (BMI) 21.0-21.9, adult: Secondary | ICD-10-CM | POA: Diagnosis not present

## 2019-08-19 DIAGNOSIS — Z124 Encounter for screening for malignant neoplasm of cervix: Secondary | ICD-10-CM | POA: Diagnosis not present

## 2019-08-21 ENCOUNTER — Ambulatory Visit (INDEPENDENT_AMBULATORY_CARE_PROVIDER_SITE_OTHER): Payer: PPO | Admitting: *Deleted

## 2019-08-21 DIAGNOSIS — I639 Cerebral infarction, unspecified: Secondary | ICD-10-CM

## 2019-08-22 LAB — CUP PACEART REMOTE DEVICE CHECK
Date Time Interrogation Session: 20201007181913
Implantable Pulse Generator Implant Date: 20191010

## 2019-08-30 NOTE — Progress Notes (Signed)
Carelink Summary Report / Loop Recorder 

## 2019-09-23 ENCOUNTER — Ambulatory Visit (INDEPENDENT_AMBULATORY_CARE_PROVIDER_SITE_OTHER): Payer: PPO | Admitting: *Deleted

## 2019-09-23 DIAGNOSIS — I639 Cerebral infarction, unspecified: Secondary | ICD-10-CM

## 2019-09-24 LAB — CUP PACEART REMOTE DEVICE CHECK
Date Time Interrogation Session: 20201109182023
Implantable Pulse Generator Implant Date: 20191010

## 2019-10-08 ENCOUNTER — Encounter: Payer: Self-pay | Admitting: Internal Medicine

## 2019-10-08 DIAGNOSIS — E7849 Other hyperlipidemia: Secondary | ICD-10-CM | POA: Diagnosis not present

## 2019-10-08 DIAGNOSIS — M859 Disorder of bone density and structure, unspecified: Secondary | ICD-10-CM | POA: Diagnosis not present

## 2019-10-15 DIAGNOSIS — E785 Hyperlipidemia, unspecified: Secondary | ICD-10-CM | POA: Diagnosis not present

## 2019-10-15 DIAGNOSIS — I10 Essential (primary) hypertension: Secondary | ICD-10-CM | POA: Diagnosis not present

## 2019-10-15 DIAGNOSIS — M8580 Other specified disorders of bone density and structure, unspecified site: Secondary | ICD-10-CM | POA: Diagnosis not present

## 2019-10-15 DIAGNOSIS — Z8673 Personal history of transient ischemic attack (TIA), and cerebral infarction without residual deficits: Secondary | ICD-10-CM | POA: Diagnosis not present

## 2019-10-15 DIAGNOSIS — Z0000 Encounter for general adult medical examination without abnormal findings: Secondary | ICD-10-CM | POA: Diagnosis not present

## 2019-10-15 DIAGNOSIS — M159 Polyosteoarthritis, unspecified: Secondary | ICD-10-CM | POA: Diagnosis not present

## 2019-10-15 DIAGNOSIS — Z1339 Encounter for screening examination for other mental health and behavioral disorders: Secondary | ICD-10-CM | POA: Diagnosis not present

## 2019-10-15 DIAGNOSIS — N3281 Overactive bladder: Secondary | ICD-10-CM | POA: Diagnosis not present

## 2019-10-15 DIAGNOSIS — Z1331 Encounter for screening for depression: Secondary | ICD-10-CM | POA: Diagnosis not present

## 2019-10-15 DIAGNOSIS — I519 Heart disease, unspecified: Secondary | ICD-10-CM | POA: Diagnosis not present

## 2019-10-15 DIAGNOSIS — D649 Anemia, unspecified: Secondary | ICD-10-CM | POA: Diagnosis not present

## 2019-10-18 DIAGNOSIS — R82998 Other abnormal findings in urine: Secondary | ICD-10-CM | POA: Diagnosis not present

## 2019-10-19 NOTE — Progress Notes (Signed)
Carelink Summary Report / Loop Recorder 

## 2019-10-25 ENCOUNTER — Ambulatory Visit (INDEPENDENT_AMBULATORY_CARE_PROVIDER_SITE_OTHER): Payer: PPO | Admitting: *Deleted

## 2019-10-25 DIAGNOSIS — I639 Cerebral infarction, unspecified: Secondary | ICD-10-CM

## 2019-10-27 LAB — CUP PACEART REMOTE DEVICE CHECK
Date Time Interrogation Session: 20201212132016
Implantable Pulse Generator Implant Date: 20191010

## 2019-11-06 DIAGNOSIS — Z1212 Encounter for screening for malignant neoplasm of rectum: Secondary | ICD-10-CM | POA: Diagnosis not present

## 2019-11-14 DIAGNOSIS — R351 Nocturia: Secondary | ICD-10-CM | POA: Diagnosis not present

## 2019-11-14 DIAGNOSIS — N3281 Overactive bladder: Secondary | ICD-10-CM | POA: Diagnosis not present

## 2019-11-24 ENCOUNTER — Ambulatory Visit: Payer: Medicare Other | Attending: Internal Medicine

## 2019-11-24 DIAGNOSIS — Z23 Encounter for immunization: Secondary | ICD-10-CM

## 2019-11-24 NOTE — Progress Notes (Signed)
   Covid-19 Vaccination Clinic  Name:  [name]    MRN: [id number] DOB: [birth year]  11/24/2019  [name] was observed post Covid-19 immunization for 15 minutes without incidence. She was provided with Vaccine Information Sheet and instruction to access the V-Safe system.   [name] was instructed to call 911 with any severe reactions post vaccine: Marland Kitchen Difficulty breathing  . Swelling of your face and throat  . A fast heartbeat  . A bad rash all over your body  . Dizziness and weakness    Immunizations Administered    Name Date Dose VIS Date Route   Pfizer COVID-19 Vaccine 11/24/2019 11:53 AM 0.3 mL 10/25/2019 Intramuscular   Manufacturer: [employer], [employer]   Lot: [id number]   [place]: [id number]

## 2019-11-27 ENCOUNTER — Encounter: Payer: Self-pay | Admitting: Internal Medicine

## 2019-11-27 ENCOUNTER — Other Ambulatory Visit: Payer: Self-pay

## 2019-11-27 ENCOUNTER — Ambulatory Visit: Payer: Medicare Other | Admitting: Internal Medicine

## 2019-11-27 VITALS — BP 149/75 | HR 65 | Ht 60.0 in | Wt 108.4 lb

## 2019-11-27 DIAGNOSIS — E7800 Pure hypercholesterolemia, unspecified: Secondary | ICD-10-CM

## 2019-11-27 DIAGNOSIS — I10 Essential (primary) hypertension: Secondary | ICD-10-CM

## 2019-11-27 DIAGNOSIS — I639 Cerebral infarction, unspecified: Secondary | ICD-10-CM | POA: Diagnosis not present

## 2019-11-27 DIAGNOSIS — Q210 Ventricular septal defect: Secondary | ICD-10-CM

## 2019-11-27 NOTE — Patient Instructions (Signed)
Medication Instructions:  NO CHANGES *If you need a refill on your cardiac medications before your next appointment, please call your pharmacy*  Lab Work: NONE If you have labs (blood work) drawn today and your tests are completely normal, you will receive your results only by: . MyChart Message (if you have MyChart) OR . A paper copy in the mail If you have any lab test that is abnormal or we need to change your treatment, we will call you to review the results.    Follow-Up: At [facility], you and your health needs are our priority.  As part of our continuing mission to provide you with exceptional heart care, we have created designated Provider Care Teams.  These Care Teams include your primary Cardiologist (physician) and Advanced Practice Providers (APPs -  Physician Assistants and Nurse Practitioners) who all work together to provide you with the care you need, when you need it.  Your next appointment:   12 month(s)  The format for your next appointment:   Either In Person or Virtual  Provider:   K. Chad Hilty, MD     

## 2019-11-27 NOTE — Progress Notes (Signed)
OFFICE NOTE  Chief Complaint:  Follow-up  Primary Care Physician: Marton Redwood, MD  HPI:  [name] is a pleasant 79 year old female who was previously followed by Dr. Rex Kras. I last saw her in April 2014 for follow-up of a small membranous VSD. She also has very mild pulmonary hypertension. She does have of course a loud murmur which is associated with small VSDs. Her last echo was in 13 and EF was 55% or greater at the time. Normal wall motion was noted. There was mild mitral annular calcification with mild to moderate mitral regurgitation. There was mild tricuspid regurgitation as well. She is overall asymptomatic. She denies chest pain or worsening shortness of breath. She is on limited medications including aspirin, calcium and Myrbetriq.  03/21/2016  [name] returns today for follow-up. She's done extremely well. 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 In the past she's had mild pulmonary hypertension however her echo last year was not able to estimate pulmonary pressures. Her EF is remains stable. Overall she is doing well without any symptoms of shortness of breath or exercise intolerance. She denies any chest pain. She does have lower extremity varicose veins and has a history of vein stripping in the past. She occasionally gets some swelling of her legs.  04/11/2018  [name] was seen today in follow-up.'s been 2 years since I last saw her.  She has a stable small membranous VSD.  EF was unchanged by echo in 2017.  She denies worsening shortness of breath or chest pain or any change in exercise capacity.  She still struggles with varicose veins.  She also has some toe deformity on the left foot and is contemplating surgery.  She rarely gets swelling of her legs.  Interestingly her blood pressure has been elevated.  It was notably high in her PCPs office in December at 181/90.  She also saw her GYN who noted her blood pressure was  elevated.  In addition her blood pressure is elevated here.  She has a list of blood pressure she took at home which I reviewed.  While there are occasional blood pressures in the low 100s in general they are between 277-824 systolic.  Overall, I feel that she may be developing some hypertension, possibly due to arterial sclerosis or aging.  Her PCP had considered treating her blood pressure.  I think it is time for her to be treated.  Labs reviewed from November 2018 showed total cholesterol 205, HDL 79, LDL 114 and triglycerides 60.  07/03/2018  [name] returns today for follow-up.  She called in over the weekend noting that she had had some recent speech difficulty.  Her speech was garbled and it was noted by her husband Regino Schultze, MD who is a retired Publishing rights manager.  The episode was transient although very upsetting for her.  She had been on aspirin in the past but has not recently taken it.  In fact she is only on a few medications.  She is compliant with lisinopril and has seen a marked improvement in blood pressure 136/60.  According to her husband who did a brief neurologic exam she had no focal neurologic deficits.  She does have a history of VSD but no known atrial septal defect or PFO.  In addition she had mild to moderate bilateral carotid artery disease by ultrasound in 2016.  Denies any palpitations or arrhythmias that she is aware of.  08/20/2018  [name]  returns today for follow-up.  She underwent 30-day monitoring which demonstrated a short run of tachycardia which was mildly irregular concerning for possible A. fib however duration was less than a few seconds.  She did not have any other episodes.  She did have an MRI which showed a hypointense lesion in the left parietal area suggestive of possible subacute stroke.  This may fit with her symptom pattern.  She is currently on aspirin however I am highly suspicious that this may have been a cardioembolic event.  I agree with her  neurologist recommendation to start Crestor 5 mg with goal LDL less than 70.  Based on the fact that there was very infrequent A. fib, there is not enough evidence to consider starting long-term anticoagulation at this point.  However due to my increased suspicions, I think she is a good candidate for an implanted loop recorder.  11/27/2018  [name] is seen today for routine follow-up as well as preoperative risk assessment.  She is doing well without any obvious recurrence of atrial fibrillation.  She did have an implanted loop recorder placed and to interrogation so far not demonstrated any A. fib.  She has been struggling with hip pain and is contemplating surgery in the next few weeks by Dr. Cornelia Copa and Baldo Ash.  From a cardiac standpoint I feel that she is acceptable risk for surgery.  We did perform an EKG today which shows normal sinus rhythm.  She was asked to hold her aspirin prior to the procedure and I would recommend holding it for a minimum of 7 days.  11/27/2019  [name] returns today for follow-up.  I recently had seen her for virtual visit.  I increased her lisinopril some more for elevated blood pressure.  She says at home it still runs in the 130s to 140s.  Currently she is only on 10 mg daily however she reports has been having some dry cough intermittently which could be side effect.  After an increase of her rosuvastatin by her PCP, her LDL now is 75, total cholesterol 166, triglycerides 47 HDL 82.  She denies any further stroke or TIA events.  Her implanted loop recorder has failed to show any significant arrhythmias.  PMHx:  Past Medical History:  Diagnosis Date  . Arthritis   . Heart murmur   . Stroke [facility])     Past Surgical History:  Procedure Laterality Date  . ABDOMINAL HYSTERECTOMY     age 63  . APPENDECTOMY    . DOPPLER ECHOCARDIOGRAPHY  2013  . Duplex Doppler  2013  . EXCISIONAL TOTAL HIP ARTHROPLASTY WITH ANTIBIOTIC SPACERS  09/25/2012   Procedure:  EXCISIONAL TOTAL HIP ARTHROPLASTY WITH ANTIBIOTIC SPACERS;  Surgeon: [name], MD;  Location: [facility];  Service: Orthopedics;  Laterality: Left;  RESECTION LEFT TOTAL HIP ARTHROPLASTY AND PLACEMENT OF ANTIBIOTIC SPACERs  . EYE SURGERY  02/2010   bilateral cataracts  . 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 JOINT REPLACEMENT  2006   left hip, revision 2009 and 07/2011  . JOINT REPLACEMENT  10/2007   right hip, revision 12/2008  . LOOP RECORDER INSERTION N/A 08/23/2018   Procedure: LOOP RECORDER INSERTION;  Surgeon: [name], MD;  Location: [place] [facility];  Service: Cardiovascular;  Laterality:  N/A;    FAMHx:  Family History  Problem Relation Age of Onset  . Failure to thrive Mother   . Stroke Father     SOCHx:   reports that she has never smoked. She has never used smokeless tobacco. She reports current alcohol use of about 1.0 standard drinks of alcohol per week. She reports that she does not use drugs.  ALLERGIES:  Allergies  Allergen Reactions  . Bactrim Rash    ROS: Pertinent items noted in HPI and remainder of comprehensive ROS otherwise negative.  HOME MEDS: Current Outpatient Medications  Medication Sig Dispense Refill  . aspirin EC 81 MG tablet Take 81 mg by mouth daily.    . Calcium Carbonate-Vitamin D (CALCIUM + D PO) Take 1 tablet by mouth daily.     Marland Kitchen lisinopril (ZESTRIL) 10 MG tablet Take 1 tablet (10 mg total) by mouth daily. 90 tablet 3  . MYRBETRIQ 50 MG TB24 Take 50 mg by mouth daily.     . rosuvastatin (CRESTOR) 10 MG tablet Take 10 mg by mouth daily.     No current facility-administered medications for this visit.    LABS/IMAGING: No results found for this or any previous visit (from the past 48 hour(s)). No  results found.  WEIGHTS: Wt Readings from Last 3 Encounters:  11/27/19 108 lb 6.4 oz (49.2 kg)  05/28/19 102 lb (46.3 kg)  11/27/18 103 lb (46.7 kg)    VITALS: BP (!) 149/75   Pulse 65   Ht 5' (1.524 m)   Wt 108 lb 6.4 oz (49.2 kg)   SpO2 99%   BMI 21.17 kg/m   EXAM: General appearance: alert and no distress Neck: no carotid bruit, no JVD and thyroid not enlarged, symmetric, no tenderness/mass/nodules Lungs: clear to auscultation bilaterally Heart: regular rate and rhythm, S1, S2 normal and systolic murmur: holosystolic 3/6, blowing at 2nd right intercostal space Abdomen: soft, non-tender; bowel sounds normal; no masses,  no organomegaly Extremities: extremities normal, atraumatic, no cyanosis or edema Pulses: 2+ and symmetric Skin: Skin color, texture, turgor normal. No rashes or lesions Neurologic: Grossly normal Psych: Pleasant  EKG: Normal sinus rhythm at 65-personally reviewed  ASSESSMENT: 1. Cryptogenic stroke-status post ILR 2. Essential hypertension 3. Dyslipidemia 4. Small perimembranous VSD 5. Mild pulmonary hypertension 6. Varicose veins  PLAN: 1.   Mrs. Samet still has uncontrolled hypertension.  Unfortunate looks like she may have cough on lisinopril.  We will stop this and switch her to valsartan 80 mg daily.  Her lipids and however better controlled with LDL of 75.  A target is less than 70 which we can work on with diet and exercise.  Fortunately loop recorder has not shown any significant findings.  She has a murmur consistent with her perimembranous VSD which is small.  Plan follow-up with me annually or sooner as necessary.  Pixie Casino, MD, [facility], [place] Director of the Advanced Lipid Disorders &   Cardiovascular Risk Reduction Clinic Diplomate of the American Board of Clinical Lipidology Attending Cardiologist  Direct Dial: [phone]  Fax: [phone]  Website:  [email or web address] 11/27/2019, 2:44 PM

## 2019-11-28 ENCOUNTER — Ambulatory Visit (INDEPENDENT_AMBULATORY_CARE_PROVIDER_SITE_OTHER): Payer: PPO | Admitting: *Deleted

## 2019-11-28 DIAGNOSIS — I639 Cerebral infarction, unspecified: Secondary | ICD-10-CM | POA: Diagnosis not present

## 2019-11-28 LAB — CUP PACEART REMOTE DEVICE CHECK
Date Time Interrogation Session: 20210114132737
Implantable Pulse Generator Implant Date: 20191010

## 2019-11-29 NOTE — Progress Notes (Signed)
ILR remote 

## 2019-12-03 NOTE — Addendum Note (Signed)
Addended by: Waylan Rocher on: 12/03/2019 10:33 AM   Modules accepted: Orders

## 2019-12-12 ENCOUNTER — Ambulatory Visit: Payer: PPO

## 2019-12-14 ENCOUNTER — Ambulatory Visit: Payer: PPO | Attending: Internal Medicine

## 2019-12-14 DIAGNOSIS — Z23 Encounter for immunization: Secondary | ICD-10-CM | POA: Insufficient documentation

## 2019-12-14 NOTE — Progress Notes (Signed)
   Covid-19 Vaccination Clinic  Name:  [name]    MRN: [id number] DOB: 03/17/1941  12/14/2019  [name] was observed post Covid-19 immunization for 15 minutes without incidence. She was provided with Vaccine Information Sheet and instruction to access the V-Safe system.   [name] was instructed to call 911 with any severe reactions post vaccine: Marland Kitchen Difficulty breathing  . Swelling of your face and throat  . A fast heartbeat  . A bad rash all over your body  . Dizziness and weakness    Immunizations Administered    Name Date Dose VIS Date Route   Pfizer COVID-19 Vaccine 12/14/2019  8:26 AM 0.3 mL 10/25/2019 Intramuscular   Manufacturer: [place]   Lot: [id number]   [place]: [id number]

## 2019-12-30 ENCOUNTER — Ambulatory Visit (INDEPENDENT_AMBULATORY_CARE_PROVIDER_SITE_OTHER): Payer: PPO | Admitting: *Deleted

## 2019-12-30 DIAGNOSIS — I639 Cerebral infarction, unspecified: Secondary | ICD-10-CM | POA: Diagnosis not present

## 2019-12-30 LAB — CUP PACEART REMOTE DEVICE CHECK
Date Time Interrogation Session: 20210214234336
Implantable Pulse Generator Implant Date: 20191010

## 2019-12-30 NOTE — Progress Notes (Signed)
ILR Remote 

## 2020-01-09 DIAGNOSIS — Z471 Aftercare following joint replacement surgery: Secondary | ICD-10-CM | POA: Diagnosis not present

## 2020-01-14 DIAGNOSIS — H52203 Unspecified astigmatism, bilateral: Secondary | ICD-10-CM | POA: Diagnosis not present

## 2020-01-14 DIAGNOSIS — H524 Presbyopia: Secondary | ICD-10-CM | POA: Diagnosis not present

## 2020-01-14 DIAGNOSIS — H5213 Myopia, bilateral: Secondary | ICD-10-CM | POA: Diagnosis not present

## 2020-01-14 DIAGNOSIS — Z961 Presence of intraocular lens: Secondary | ICD-10-CM | POA: Diagnosis not present

## 2020-01-30 LAB — CUP PACEART REMOTE DEVICE CHECK
Date Time Interrogation Session: 20210318012802
Implantable Pulse Generator Implant Date: 20191010

## 2020-03-01 LAB — CUP PACEART REMOTE DEVICE CHECK
Date Time Interrogation Session: 20210418014409
Implantable Pulse Generator Implant Date: 20191010

## 2020-03-02 ENCOUNTER — Ambulatory Visit (INDEPENDENT_AMBULATORY_CARE_PROVIDER_SITE_OTHER): Payer: PPO | Admitting: *Deleted

## 2020-03-02 DIAGNOSIS — I639 Cerebral infarction, unspecified: Secondary | ICD-10-CM | POA: Diagnosis not present

## 2020-03-02 NOTE — Progress Notes (Signed)
ILR Remote 

## 2020-03-05 ENCOUNTER — Telehealth: Payer: Self-pay

## 2020-03-05 NOTE — Telephone Encounter (Signed)
Carelink alert received 03/05/20 at 08:35 AM for 1 tachy episode, lasted 5 seconds in duration with rate of 167 bpm. Patient called and assessed, patient denies any complaints from yesterday states she was at home. Advised patient to call DC back if she have any questions or concerns. Verbalized understanding.

## 2020-03-05 NOTE — Telephone Encounter (Signed)
Thanks, appears to be paroxysmal atrial tachycardia. No change in meds. Continue to monitor.

## 2020-03-27 ENCOUNTER — Other Ambulatory Visit: Payer: Self-pay | Admitting: Internal Medicine

## 2020-04-01 LAB — CUP PACEART REMOTE DEVICE CHECK
Date Time Interrogation Session: 20210519015322
Implantable Pulse Generator Implant Date: 20191010

## 2020-04-06 ENCOUNTER — Ambulatory Visit (INDEPENDENT_AMBULATORY_CARE_PROVIDER_SITE_OTHER): Payer: PPO | Admitting: *Deleted

## 2020-04-06 DIAGNOSIS — I639 Cerebral infarction, unspecified: Secondary | ICD-10-CM | POA: Diagnosis not present

## 2020-04-06 NOTE — Progress Notes (Signed)
Carelink Summary Report / Loop Recorder 

## 2020-04-09 ENCOUNTER — Other Ambulatory Visit: Payer: Self-pay | Admitting: Internal Medicine

## 2020-04-09 ENCOUNTER — Telehealth: Payer: Self-pay | Admitting: Internal Medicine

## 2020-04-09 MED ORDER — LISINOPRIL 10 MG PO TABS: 10.0000 mg | ORAL_TABLET | Freq: Every day | ORAL | 3 refills | Status: DC

## 2020-04-09 NOTE — Telephone Encounter (Signed)
Follow Up  Confirming that medication has been sent to pharmacy.

## 2020-04-09 NOTE — Telephone Encounter (Signed)
Follow Up:  She said she never received a new prescription for pt's Lisinopril 10 mg.

## 2020-04-09 NOTE — Telephone Encounter (Signed)
Rx has been sent to the pharmacy electronically. ° °

## 2020-04-16 DIAGNOSIS — Z85828 Personal history of other malignant neoplasm of skin: Secondary | ICD-10-CM | POA: Diagnosis not present

## 2020-04-16 DIAGNOSIS — L570 Actinic keratosis: Secondary | ICD-10-CM | POA: Diagnosis not present

## 2020-04-16 DIAGNOSIS — L84 Corns and callosities: Secondary | ICD-10-CM | POA: Diagnosis not present

## 2020-04-16 DIAGNOSIS — D1801 Hemangioma of skin and subcutaneous tissue: Secondary | ICD-10-CM | POA: Diagnosis not present

## 2020-04-16 DIAGNOSIS — L812 Freckles: Secondary | ICD-10-CM | POA: Diagnosis not present

## 2020-04-16 DIAGNOSIS — L821 Other seborrheic keratosis: Secondary | ICD-10-CM | POA: Diagnosis not present

## 2020-05-07 ENCOUNTER — Ambulatory Visit: Payer: PPO | Admitting: Podiatry

## 2020-05-11 ENCOUNTER — Ambulatory Visit (INDEPENDENT_AMBULATORY_CARE_PROVIDER_SITE_OTHER): Payer: PPO | Admitting: *Deleted

## 2020-05-11 DIAGNOSIS — I639 Cerebral infarction, unspecified: Secondary | ICD-10-CM | POA: Diagnosis not present

## 2020-05-11 LAB — CUP PACEART REMOTE DEVICE CHECK
Date Time Interrogation Session: 20210627234058
Implantable Pulse Generator Implant Date: 20191010

## 2020-05-12 NOTE — Progress Notes (Signed)
Carelink Summary Report / Loop Recorder 

## 2020-05-25 ENCOUNTER — Telehealth: Payer: Self-pay

## 2020-05-25 NOTE — Telephone Encounter (Signed)
Carelink alert received for pause episode, 4 seconds occurring at 1512.  Pt does not have history of this.    Spoke with pt, she ahd been working out in the yard yesterday and was very tired afterwards.  She believes at the time this occurred she was either resting or may have actually fallen asleep.

## 2020-05-25 NOTE — Telephone Encounter (Signed)
The loop recorder was initially implanted for stroke, not syncope. If asymptomatic, does not meet criteria for pacemaker implantation, although if symptomatic (dizzy, pre-syncope) it would. Please make an appt to discuss in the office.

## 2020-05-25 NOTE — Telephone Encounter (Signed)
Spoke with the patient. She stated that she did feel more tired lately. Her blood pressure at 1pm was 79/49. At 2 pm it had increased to 93/52. An appointment has been made for tomorrow with Dr. Sallyanne Kuster.

## 2020-05-26 ENCOUNTER — Other Ambulatory Visit: Payer: Self-pay

## 2020-05-26 ENCOUNTER — Encounter: Payer: Self-pay | Admitting: Cardiovascular Disease

## 2020-05-26 ENCOUNTER — Ambulatory Visit: Payer: PPO | Admitting: Cardiovascular Disease

## 2020-05-26 VITALS — BP 141/70 | HR 73 | Ht 60.0 in | Wt 105.6 lb

## 2020-05-26 DIAGNOSIS — I455 Other specified heart block: Secondary | ICD-10-CM

## 2020-05-26 DIAGNOSIS — Z8673 Personal history of transient ischemic attack (TIA), and cerebral infarction without residual deficits: Secondary | ICD-10-CM

## 2020-05-26 DIAGNOSIS — Z9889 Other specified postprocedural states: Secondary | ICD-10-CM

## 2020-05-26 NOTE — Patient Instructions (Signed)

## 2020-05-26 NOTE — Progress Notes (Signed)
Cardiology Office Note:    Date:  05/27/2020   ID:  [name], DOB 12-14-1940, MRN [id number]  PCP:  Marton Redwood, MD  [facility] Cardiologist:  Pixie Casino, MD  [place] Electrophysiologist:  None   Referring MD: Marton Redwood, MD   Chief Complaint  Patient presents with  . Irregular Heart Beat    4 second pause on loop recorder    History of Present Illness:    [name] is a 79 y.o. female with a hx of a tiny restrictive membranous VSD, mild pulmonary artery hypertension, cryptogenic stroke, status post loop recorder implantation who came in today to discuss a prolonged sinus pause incidentally recorded by her loop recorder.  She has preserved left ventricular systolic function no history of coronary or other vascular disease.  Her loop recorder has not shown evidence of atrial fibrillation to date, but did record a single 5-second episode of regular paroxysmal atrial tachycardia in April.  Sinus pause lasting about 4 seconds was recorded on July 12.  She was not aware of any abnormalities.  She does not think she had any dizziness around the time, although she felt tired that day and her blood pressure was relatively low.  It is possible that she had fallen asleep around the time of the event.  She has never experienced syncope.      Past Medical History:  Diagnosis Date  . Arthritis   . Heart murmur   . Stroke [facility])     Past Surgical History:  Procedure Laterality Date  . ABDOMINAL HYSTERECTOMY     age 34  . APPENDECTOMY    . DOPPLER ECHOCARDIOGRAPHY  2013  . Duplex Doppler  2013  . EXCISIONAL TOTAL HIP ARTHROPLASTY WITH ANTIBIOTIC SPACERS  09/25/2012   Procedure: EXCISIONAL TOTAL HIP ARTHROPLASTY WITH ANTIBIOTIC SPACERS;  Surgeon: [name], MD;  Location: [facility];  Service: Orthopedics;  Laterality: Left;  RESECTION LEFT TOTAL HIP ARTHROPLASTY AND PLACEMENT OF ANTIBIOTIC SPACERs  . EYE SURGERY  02/2010   bilateral cataracts  .  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 JOINT REPLACEMENT  2006   left hip, revision 2009 and 07/2011  . JOINT REPLACEMENT  10/2007   right hip, revision 12/2008  . LOOP RECORDER INSERTION N/A 08/23/2018   Procedure: LOOP RECORDER INSERTION;  Surgeon: [name], MD;  Location: [facility];  Service: Cardiovascular;  Laterality: N/A;    Current Medications: Current Meds  Medication Sig  . aspirin EC 81 MG tablet Take 81 mg by mouth daily.  . Calcium Carbonate-Vitamin D (CALCIUM + D PO) Take 1 tablet by mouth daily.   Marland Kitchen lisinopril (ZESTRIL) 10 MG tablet Take 1 tablet (10 mg total) by mouth daily.  Marland Kitchen MYRBETRIQ 50 MG TB24 Take 50 mg by mouth daily.   . rosuvastatin (CRESTOR) 10 MG tablet Take 10 mg by mouth daily.     Allergies:   Bactrim   Social History   Socioeconomic History  . Marital status: Married    Spouse name: Not on file  . Number of children: Not on file  . Years of education: Not on file  . Highest education level: Not on file  Occupational History  . Not on file  Tobacco Use  .  Smoking status: Never Smoker  . Smokeless tobacco: Never Used  Substance and Sexual Activity  . Alcohol use: Yes    Alcohol/week: 1.0 standard drink    Types: 1 Glasses of wine per week    Comment: occ.  . Drug use: No  . Sexual activity: Not on file  Other Topics Concern  . Not on file  Social History Narrative  . Not on file   Social Determinants of Health   Financial Resource Strain:   . Difficulty of Paying Living Expenses:   Food Insecurity:   . Worried About Charity fundraiser in the Last Year:   . Arboriculturist in the Last Year:   Transportation Needs:   . Film/video editor (Medical):   Marland Kitchen Lack of Transportation  (Non-Medical):   Physical Activity:   . Days of Exercise per Week:   . Minutes of Exercise per Session:   Stress:   . Feeling of Stress :   Social Connections:   . Frequency of Communication with Friends and Family:   . Frequency of Social Gatherings with Friends and Family:   . Attends Religious Services:   . Active Member of Clubs or Organizations:   . Attends Archivist Meetings:   Marland Kitchen Marital Status:      Family History: The patient's family history includes Failure to thrive in her mother; Stroke in her father.  ROS:   Please see the history of present illness.     All other systems reviewed and are negative.  EKGs/Labs/Other Studies Reviewed:    The following studies were reviewed today: Loop recorder download  EKG:  EKG is ordered today.  The ekg ordered today demonstrates NSR, normal tracing. QTc 418 ms.  Recent Labs: No results found for requested labs within last 8760 hours.  Recent Lipid Panel    Component Value Date/Time   CHOL 197 07/09/2018 1343   TRIG 76 07/09/2018 1343   HDL 83 07/09/2018 1343   CHOLHDL 2.4 07/09/2018 1343   LDLCALC 99 07/09/2018 1343    Physical Exam:    VS:  BP (!) 141/70   Pulse 73   Ht 5' (1.524 m)   Wt 105 lb 9.6 oz (47.9 kg)   SpO2 99%   BMI 20.62 kg/m     Wt Readings from Last 3 Encounters:  05/26/20 105 lb 9.6 oz (47.9 kg)  11/27/19 108 lb 6.4 oz (49.2 kg)  05/28/19 102 lb (46.3 kg)     GEN: Lean,  Well nourished, well developed in no acute distress HEENT: Normal NECK: No JVD; No carotid bruits LYMPHATICS: No lymphadenopathy CARDIAC: RRR, no murmurs, rubs, gallops RESPIRATORY:  Clear to auscultation without rales, wheezing or rhonchi  ABDOMEN: Soft, non-tender, non-distended MUSCULOSKELETAL:  No edema; No deformity  SKIN: Warm and dry NEUROLOGIC:  Alert and oriented x 3 PSYCHIATRIC:  Normal affect   ASSESSMENT:    1. Sinus pause   2. History of embolic stroke   3. History of loop recorder     PLAN:    In order of problems listed above:  1. Sinus pause: asymptomatic. Continue monitoring. Avoid negative chronotropic agents. Discussed pacemaker indication for asymptomatic pauses in excess of 5 seconds or if she develops syncope, pauses in excess of 3 seconds (already documented).  Dragon 2. Hx CVA: Only a single 5-second episode of regular atrial tachycardia recorded since loop recorder implantation.  Unlikely to see atrial fibrillation going forward.  On aspirin. 3. ILR: Continue  monthly downloads.  Advised her to use her activator if needed. 4. HTN: fair control (lower at home checks). 5. HLP: on statin.   Medication Adjustments/Labs and Tests Ordered: Current medicines are reviewed at length with the patient today.  Concerns regarding medicines are outlined above.  Orders Placed This Encounter  Procedures  . EKG 12-Lead   No orders of the defined types were placed in this encounter.   Patient Instructions  Medication Instructions:  No changes *If you need a refill on your cardiac medications before your next appointment, please call your pharmacy*   Lab Work: None ordered If you have labs (blood work) drawn today and your tests are completely normal, you will receive your results only by: Marland Kitchen MyChart Message (if you have MyChart) OR . A paper copy in the mail If you have any lab test that is abnormal or we need to change your treatment, we will call you to review the results.   Testing/Procedures: None ordered   Follow-Up: At [facility], you and your health needs are our priority.  As part of our continuing mission to provide you with exceptional heart care, we have created designated Provider Care Teams.  These Care Teams include your primary Cardiologist (physician) and Advanced Practice Providers (APPs -  Physician Assistants and Nurse Practitioners) who all work together to provide you with the care you need, when you need it.  We recommend signing up  for the patient portal called "MyChart".  Sign up information is provided on this After Visit Summary.  MyChart is used to connect with patients for Virtual Visits (Telemedicine).  Patients are able to view lab/test results, encounter notes, upcoming appointments, etc.  Non-urgent messages can be sent to your provider as well.   To learn more about what you can do with MyChart, go to [email or web address].    Your next appointment:   12 month(s)  The format for your next appointment:   In Person  Provider:   [name], MD      Signed, [name], MD  05/27/2020 10:49 AM    [place]

## 2020-06-11 ENCOUNTER — Telehealth: Payer: Self-pay

## 2020-06-11 NOTE — Telephone Encounter (Signed)
The pt will be out of town and not near her monitor next week. She is leaving on the July 31st and coming back August 7th.

## 2020-06-12 DIAGNOSIS — Z96643 Presence of artificial hip joint, bilateral: Secondary | ICD-10-CM | POA: Diagnosis not present

## 2020-06-12 DIAGNOSIS — Z96642 Presence of left artificial hip joint: Secondary | ICD-10-CM | POA: Diagnosis not present

## 2020-06-12 DIAGNOSIS — Z96641 Presence of right artificial hip joint: Secondary | ICD-10-CM | POA: Diagnosis not present

## 2020-06-12 DIAGNOSIS — Z471 Aftercare following joint replacement surgery: Secondary | ICD-10-CM | POA: Diagnosis not present

## 2020-06-15 ENCOUNTER — Ambulatory Visit (INDEPENDENT_AMBULATORY_CARE_PROVIDER_SITE_OTHER): Payer: PPO | Admitting: *Deleted

## 2020-06-15 DIAGNOSIS — I639 Cerebral infarction, unspecified: Secondary | ICD-10-CM | POA: Diagnosis not present

## 2020-06-16 LAB — CUP PACEART REMOTE DEVICE CHECK
Date Time Interrogation Session: 20210730234520
Implantable Pulse Generator Implant Date: 20191010

## 2020-06-17 NOTE — Progress Notes (Signed)
Carelink Summary Report / Loop Recorder 

## 2020-06-22 ENCOUNTER — Telehealth: Payer: Self-pay

## 2020-06-22 MED ORDER — LISINOPRIL 5 MG PO TABS: 5.0000 mg | ORAL_TABLET | Freq: Every day | ORAL | 1 refills | Status: DC

## 2020-06-22 NOTE — Telephone Encounter (Signed)
Reviewed recent sinus pause. Symptoms were mild. She wants to continue monitoring. Incidentally notes her BP is often low (SBP in high 80s).  Please reduce the lisinopril to 5 mg daily.

## 2020-06-22 NOTE — Telephone Encounter (Addendum)
New prescription for Lisinopril 5 mg once daily has been sent in for the patient.

## 2020-06-22 NOTE — Telephone Encounter (Signed)
Carelink event report - pause episode - 4 second pause.    Episode occurred 06/14/20 at 2104  Spoke with pt, [place] of the event which is why the report was delayed in coming through.  Pt reports she was aware of event as she suddenly felt lightheaded.  The symptom was very brief and self limiting.  Pt aware to notify office if any syncope occurs.  Noted pt regular sleep hours from 11pm-6am.  Will continue monitoring.    Per MD office notes, from 05/26/20: Sinus pause: asymptomatic. Continue monitoring. Avoid negative chronotropic agents. Discussed pacemaker indication for asymptomatic pauses in excess of 5 seconds or if she develops syncope, pauses in excess of 3 seconds (already documented).

## 2020-07-19 LAB — CUP PACEART REMOTE DEVICE CHECK
Date Time Interrogation Session: 20210901234856
Implantable Pulse Generator Implant Date: 20191010

## 2020-07-21 ENCOUNTER — Ambulatory Visit (INDEPENDENT_AMBULATORY_CARE_PROVIDER_SITE_OTHER): Payer: PPO | Admitting: *Deleted

## 2020-07-21 DIAGNOSIS — I639 Cerebral infarction, unspecified: Secondary | ICD-10-CM | POA: Diagnosis not present

## 2020-07-22 NOTE — Progress Notes (Signed)
Carelink Summary Report / Loop Recorder 

## 2020-08-04 DIAGNOSIS — N3281 Overactive bladder: Secondary | ICD-10-CM | POA: Diagnosis not present

## 2020-08-04 DIAGNOSIS — R351 Nocturia: Secondary | ICD-10-CM | POA: Diagnosis not present

## 2020-08-13 DIAGNOSIS — M13849 Other specified arthritis, unspecified hand: Secondary | ICD-10-CM | POA: Diagnosis not present

## 2020-08-13 DIAGNOSIS — M13841 Other specified arthritis, right hand: Secondary | ICD-10-CM | POA: Diagnosis not present

## 2020-08-13 DIAGNOSIS — M79641 Pain in right hand: Secondary | ICD-10-CM | POA: Diagnosis not present

## 2020-08-13 DIAGNOSIS — M8580 Other specified disorders of bone density and structure, unspecified site: Secondary | ICD-10-CM | POA: Diagnosis not present

## 2020-08-19 LAB — CUP PACEART REMOTE DEVICE CHECK
Date Time Interrogation Session: 20211004235032
Implantable Pulse Generator Implant Date: 20191010

## 2020-08-20 DIAGNOSIS — Z1231 Encounter for screening mammogram for malignant neoplasm of breast: Secondary | ICD-10-CM | POA: Diagnosis not present

## 2020-08-20 DIAGNOSIS — Z01419 Encounter for gynecological examination (general) (routine) without abnormal findings: Secondary | ICD-10-CM | POA: Diagnosis not present

## 2020-08-20 DIAGNOSIS — Z6822 Body mass index (BMI) 22.0-22.9, adult: Secondary | ICD-10-CM | POA: Diagnosis not present

## 2020-08-24 ENCOUNTER — Ambulatory Visit (INDEPENDENT_AMBULATORY_CARE_PROVIDER_SITE_OTHER): Payer: PPO

## 2020-08-24 DIAGNOSIS — I639 Cerebral infarction, unspecified: Secondary | ICD-10-CM

## 2020-08-25 NOTE — Progress Notes (Signed)
Carelink Summary Report / Loop Recorder 

## 2020-09-09 DIAGNOSIS — M2042 Other hammer toe(s) (acquired), left foot: Secondary | ICD-10-CM | POA: Diagnosis not present

## 2020-09-09 DIAGNOSIS — M2021 Hallux rigidus, right foot: Secondary | ICD-10-CM | POA: Diagnosis not present

## 2020-09-10 ENCOUNTER — Telehealth: Payer: Self-pay

## 2020-09-10 NOTE — Telephone Encounter (Signed)
   [facility] Pre-operative Risk Assessment    HEARTCARE STAFF: - Please ensure there is not already an duplicate clearance open for this procedure. - Under Visit Info/Reason for Call, type in Other and utilize the format Clearance MM/DD/YY or Clearance TBD. Do not use dashes or single digits. - If request is for dental extraction, please clarify the # of teeth to be extracted.  Request for surgical clearance:  1. What type of surgery is being performed? Left Foot arthrodesis/hamertoecorr./osteot   2. When is this surgery scheduled? TBD   3. What type of clearance is required (medical clearance vs. Pharmacy clearance to hold med vs. Both)? medical  4. Are there any medications that need to be held prior to surgery and how long?Aspirin   5. Practice name and name of physician performing surgery? [facility] Dr. Jenny Reichmann [place]witt   6. What is the office phone number? [phone]   7.   What is the office fax number? [phone]  8.   Anesthesia type (None, local, MAC, general) ? choice   Monia Pouch 09/10/2020, 10:39 AM  _________________________________________________________________   (provider comments below)

## 2020-09-10 NOTE — Telephone Encounter (Signed)
   Primary Cardiologist: Pixie Casino, MD  Chart reviewed as part of pre-operative protocol coverage. Patient was contacted 09/10/2020 in reference to pre-operative risk assessment for pending surgery as outlined below.  [name] was last seen on 05/17/20 by Dr. Sallyanne Kuster.  Since that day, [name] has done well from a cardiac standpoint. She is quite active, walking her dogs, gardening, and riding a stationary bike. She can easily complete 4 METs without anginal complaints. Her recent loop recorder interrogation was without concerning arrhythmias.   Therefore, based on ACC/AHA guidelines, the patient would be at acceptable risk for the planned procedure without further cardiovascular testing.   In regards to her aspirin, she is on this medication for history of stroke. Will defer to PCP on recommendations for holding this medication prior to surgery.  The patient was advised that if she develops new symptoms prior to surgery to contact our office to arrange for a follow-up visit, and she verbalized understanding.  I will route this recommendation to the requesting party via Epic fax function and remove from pre-op pool. Please call with questions.  Abigail Butts, PA-C 09/10/2020, 4:04 PM

## 2020-09-20 LAB — CUP PACEART REMOTE DEVICE CHECK
Date Time Interrogation Session: 20211107003746
Implantable Pulse Generator Implant Date: 20191010

## 2020-09-28 ENCOUNTER — Ambulatory Visit (INDEPENDENT_AMBULATORY_CARE_PROVIDER_SITE_OTHER): Payer: PPO

## 2020-09-28 DIAGNOSIS — I639 Cerebral infarction, unspecified: Secondary | ICD-10-CM

## 2020-09-29 NOTE — Progress Notes (Signed)
Carelink Summary Report / Loop Recorder 

## 2020-10-02 DIAGNOSIS — M7742 Metatarsalgia, left foot: Secondary | ICD-10-CM | POA: Diagnosis not present

## 2020-10-02 DIAGNOSIS — M7741 Metatarsalgia, right foot: Secondary | ICD-10-CM | POA: Diagnosis not present

## 2020-10-02 DIAGNOSIS — M2022 Hallux rigidus, left foot: Secondary | ICD-10-CM | POA: Diagnosis not present

## 2020-10-02 DIAGNOSIS — M25571 Pain in right ankle and joints of right foot: Secondary | ICD-10-CM | POA: Diagnosis not present

## 2020-10-09 DIAGNOSIS — Z0000 Encounter for general adult medical examination without abnormal findings: Secondary | ICD-10-CM | POA: Diagnosis not present

## 2020-10-09 DIAGNOSIS — E785 Hyperlipidemia, unspecified: Secondary | ICD-10-CM | POA: Diagnosis not present

## 2020-10-16 DIAGNOSIS — Z1331 Encounter for screening for depression: Secondary | ICD-10-CM | POA: Diagnosis not present

## 2020-10-16 DIAGNOSIS — I8390 Asymptomatic varicose veins of unspecified lower extremity: Secondary | ICD-10-CM | POA: Diagnosis not present

## 2020-10-16 DIAGNOSIS — Q210 Ventricular septal defect: Secondary | ICD-10-CM | POA: Diagnosis not present

## 2020-10-16 DIAGNOSIS — Z0000 Encounter for general adult medical examination without abnormal findings: Secondary | ICD-10-CM | POA: Diagnosis not present

## 2020-10-16 DIAGNOSIS — M859 Disorder of bone density and structure, unspecified: Secondary | ICD-10-CM | POA: Diagnosis not present

## 2020-10-16 DIAGNOSIS — R82998 Other abnormal findings in urine: Secondary | ICD-10-CM | POA: Diagnosis not present

## 2020-10-16 DIAGNOSIS — Z1339 Encounter for screening examination for other mental health and behavioral disorders: Secondary | ICD-10-CM | POA: Diagnosis not present

## 2020-10-16 DIAGNOSIS — Z8673 Personal history of transient ischemic attack (TIA), and cerebral infarction without residual deficits: Secondary | ICD-10-CM | POA: Diagnosis not present

## 2020-10-16 DIAGNOSIS — E785 Hyperlipidemia, unspecified: Secondary | ICD-10-CM | POA: Diagnosis not present

## 2020-10-16 DIAGNOSIS — Z1212 Encounter for screening for malignant neoplasm of rectum: Secondary | ICD-10-CM | POA: Diagnosis not present

## 2020-10-16 DIAGNOSIS — N3281 Overactive bladder: Secondary | ICD-10-CM | POA: Diagnosis not present

## 2020-10-16 DIAGNOSIS — I10 Essential (primary) hypertension: Secondary | ICD-10-CM | POA: Diagnosis not present

## 2020-10-16 DIAGNOSIS — M8580 Other specified disorders of bone density and structure, unspecified site: Secondary | ICD-10-CM | POA: Diagnosis not present

## 2020-10-16 DIAGNOSIS — M2042 Other hammer toe(s) (acquired), left foot: Secondary | ICD-10-CM | POA: Diagnosis not present

## 2020-10-16 DIAGNOSIS — L84 Corns and callosities: Secondary | ICD-10-CM | POA: Diagnosis not present

## 2020-10-22 ENCOUNTER — Encounter: Payer: Self-pay | Admitting: Internal Medicine

## 2020-10-22 ENCOUNTER — Ambulatory Visit: Payer: PPO | Admitting: Internal Medicine

## 2020-10-22 ENCOUNTER — Other Ambulatory Visit: Payer: Self-pay

## 2020-10-22 VITALS — BP 148/66 | HR 70 | Ht 60.0 in | Wt 104.6 lb

## 2020-10-22 DIAGNOSIS — Z01810 Encounter for preprocedural cardiovascular examination: Secondary | ICD-10-CM | POA: Diagnosis not present

## 2020-10-22 DIAGNOSIS — Q210 Ventricular septal defect: Secondary | ICD-10-CM

## 2020-10-22 DIAGNOSIS — E782 Mixed hyperlipidemia: Secondary | ICD-10-CM | POA: Diagnosis not present

## 2020-10-22 DIAGNOSIS — I10 Essential (primary) hypertension: Secondary | ICD-10-CM

## 2020-10-22 NOTE — Patient Instructions (Signed)
Medication Instructions:  Your physician recommends that you continue on your current medications as directed. Please refer to the Current Medication list given to you today.  *If you need a refill on your cardiac medications before your next appointment, please call your pharmacy*  Follow-Up: At [facility], you and your health needs are our priority.  As part of our continuing mission to provide you with exceptional heart care, we have created designated Provider Care Teams.  These Care Teams include your primary Cardiologist (physician) and Advanced Practice Providers (APPs -  Physician Assistants and Nurse Practitioners) who all work together to provide you with the care you need, when you need it.  We recommend signing up for the patient portal called "MyChart".  Sign up information is provided on this After Visit Summary.  MyChart is used to connect with patients for Virtual Visits (Telemedicine).  Patients are able to view lab/test results, encounter notes, upcoming appointments, etc.  Non-urgent messages can be sent to your provider as well.   To learn more about what you can do with MyChart, go to [email or web address].    Your next appointment:   12 month(s)  The format for your next appointment:   In Person  Provider:   You may see Pixie Casino, MD or one of the following Advanced Practice Providers on your designated Care Team:    Almyra Deforest, PA-C  Fabian Sharp, PA-C or   Roby Lofts, [place]    Other Instructions OK to hold aspirin 7 days prior to surgery

## 2020-10-22 NOTE — Progress Notes (Signed)
OFFICE NOTE  Chief Complaint:  Follow-up  Primary Care Physician: Marton Redwood, MD  HPI:  [name] is a pleasant 79 year old female who was previously followed by Dr. Rex [name]. I last saw her in April 2014 for follow-up of a small membranous VSD. She also has very mild pulmonary hypertension. She does have of course a loud murmur which is associated with small VSDs. Her last echo was in 13 and EF was 55% or greater at the time. Normal wall motion was noted. There was mild mitral annular calcification with mild to moderate mitral regurgitation. There was mild tricuspid regurgitation as well. She is overall asymptomatic. She denies chest pain or worsening shortness of breath. She is on limited medications including aspirin, calcium and Myrbetriq.  03/21/2016  [name] returns today for follow-up. She's done extremely well. 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 In the past she's had mild pulmonary hypertension however her echo last year was not able to estimate pulmonary pressures. Her EF is remains stable. Overall she is doing well without any symptoms of shortness of breath or exercise intolerance. She denies any chest pain. She does have lower extremity varicose veins and has a history of vein stripping in the past. She occasionally gets some swelling of her legs.  04/11/2018  [name] was seen today in follow-up.'s been 2 years since I last saw her.  She has a stable small membranous VSD.  EF was unchanged by echo in 2017.  She denies worsening shortness of breath or chest pain or any change in exercise capacity.  She still struggles with varicose veins.  She also has some toe deformity on the left foot and is contemplating surgery.  She rarely gets swelling of her legs.  Interestingly her blood pressure has been elevated.  It was notably high in her PCPs office in December at 181/90.  She also saw her GYN who noted her blood pressure was  elevated.  In addition her blood pressure is elevated here.  She has a list of blood pressure she took at home which I reviewed.  While there are occasional blood pressures in the low 100s in general they are between 277-824 systolic.  Overall, I feel that she may be developing some hypertension, possibly due to arterial sclerosis or aging.  Her PCP had considered treating her blood pressure.  I think it is time for her to be treated.  Labs reviewed from November 2018 showed total cholesterol 205, HDL 79, LDL 114 and triglycerides 60.  07/03/2018  [name] returns today for follow-up.  She called in over the weekend noting that she had had some recent speech difficulty.  Her speech was garbled and it was noted by her husband Regino Schultze, MD who is a retired Publishing rights manager.  The episode was transient although very upsetting for her.  She had been on aspirin in the past but has not recently taken it.  In fact she is only on a few medications.  She is compliant with lisinopril and has seen a marked improvement in blood pressure 136/60.  According to her husband who did a brief neurologic exam she had no focal neurologic deficits.  She does have a history of VSD but no known atrial septal defect or PFO.  In addition she had mild to moderate bilateral carotid artery disease by ultrasound in 2016.  Denies any palpitations or arrhythmias that she is aware of.  08/20/2018  [name]  returns today for follow-up.  She underwent 30-day monitoring which demonstrated a short run of tachycardia which was mildly irregular concerning for possible A. fib however duration was less than a few seconds.  She did not have any other episodes.  She did have an MRI which showed a hypointense lesion in the left parietal area suggestive of possible subacute stroke.  This may fit with her symptom pattern.  She is currently on aspirin however I am highly suspicious that this may have been a cardioembolic event.  I agree with her  neurologist recommendation to start Crestor 5 mg with goal LDL less than 70.  Based on the fact that there was very infrequent A. fib, there is not enough evidence to consider starting long-term anticoagulation at this point.  However due to my increased suspicions, I think she is a good candidate for an implanted loop recorder.  11/27/2018  [name] is seen today for routine follow-up as well as preoperative risk assessment.  She is doing well without any obvious recurrence of atrial fibrillation.  She did have an implanted loop recorder placed and to interrogation so far not demonstrated any A. fib.  She has been struggling with hip pain and is contemplating surgery in the next few weeks by Dr. Cornelia Copa and Baldo Ash.  From a cardiac standpoint I feel that she is acceptable risk for surgery.  We did perform an EKG today which shows normal sinus rhythm.  She was asked to hold her aspirin prior to the procedure and I would recommend holding it for a minimum of 7 days.  11/27/2019  [name] returns today for follow-up.  I recently had seen her for virtual visit.  I increased her lisinopril some more for elevated blood pressure.  She says at home it still runs in the 130s to 140s.  Currently she is only on 10 mg daily however she reports has been having some dry cough intermittently which could be side effect.  After an increase of her rosuvastatin by her PCP, her LDL now is 75, total cholesterol 166, triglycerides 47 HDL 82.  She denies any further stroke or TIA events.  Her implanted loop recorder has failed to show any significant arrhythmias.  10/22/2020  [name] is seen today in follow-up.  She continues to do well.  She has an upcoming surgery however with Dr.Hewitt due to concerns about arthritis and difficulty walking with her feet.  She has had interval improvement in her numbers.  Her cholesterol is down even further with a total 151, triglycerides 83, HDL 49 and LDL 58.  Her APO B was 51 and  A1c 4.9, these labs coming from Dr. Raul Del office at [facility].  From a cardiac standpoint she should be okay to go ahead with her surgery.  PMHx:  Past Medical History:  Diagnosis Date  . Arthritis   . Heart murmur   . Stroke [facility])     Past Surgical History:  Procedure Laterality Date  . ABDOMINAL HYSTERECTOMY     age 55  . APPENDECTOMY    . DOPPLER ECHOCARDIOGRAPHY  2013  . Duplex Doppler  2013  . EXCISIONAL TOTAL HIP ARTHROPLASTY WITH ANTIBIOTIC SPACERS  09/25/2012   Procedure: EXCISIONAL TOTAL HIP ARTHROPLASTY WITH ANTIBIOTIC SPACERS;  Surgeon: [name], MD;  Location: [facility];  Service: Orthopedics;  Laterality: Left;  RESECTION LEFT TOTAL HIP ARTHROPLASTY AND PLACEMENT OF ANTIBIOTIC SPACERs  . EYE SURGERY  02/2010   bilateral cataracts  . 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 JOINT REPLACEMENT  2006   left hip, revision 2009 and 07/2011  . JOINT REPLACEMENT  10/2007   right hip, revision 12/2008  . LOOP RECORDER INSERTION N/A 08/23/2018   Procedure: LOOP RECORDER INSERTION;  Surgeon: [name], MD;  Location: [place] [facility];  Service: Cardiovascular;  Laterality: N/A;    FAMHx:  Family History  Problem Relation Age of Onset  . Failure to thrive Mother   . Stroke Father     SOCHx:   reports that she has never smoked. She has never used smokeless tobacco. She reports current alcohol use of about 1.0 standard drink of alcohol per week. She reports that she does not use drugs.  ALLERGIES:  Allergies  Allergen Reactions  . Bactrim Rash    ROS: Pertinent items noted in HPI and remainder of comprehensive ROS otherwise negative.  HOME MEDS: Current Outpatient Medications  Medication Sig Dispense Refill  .  amoxicillin (AMOXIL) 500 MG tablet amoxicillin 500 mg tablet  TAKE 4 TABLETS BY MOUTH 1 HOUR BEFORE DENTAL APPOINTMENT    . aspirin EC 81 MG tablet Take 81 mg by mouth daily.    . Calcium Carbonate-Vitamin D (CALCIUM + D PO) Take 1 tablet by mouth daily.     Marland Kitchen lisinopril (ZESTRIL) 5 MG tablet Take 1 tablet (5 mg total) by mouth daily. 90 tablet 1  . MYRBETRIQ 50 MG TB24 Take 50 mg by mouth daily.     . rosuvastatin (CRESTOR) 10 MG tablet Take 10 mg by mouth daily.     No current facility-administered medications for this visit.    LABS/IMAGING: No results found for this or any previous visit (from the past 48 hour(s)). No results found.  WEIGHTS: Wt Readings from Last 3 Encounters:  10/22/20 104 lb 9.6 oz (47.4 kg)  05/26/20 105 lb 9.6 oz (47.9 kg)  11/27/19 108 lb 6.4 oz (49.2 kg)    VITALS: BP (!) 148/66   Pulse 70   Ht 5' (1.524 m)   Wt 104 lb 9.6 oz (47.4 kg)   BMI 20.43 kg/m   EXAM: General appearance: alert and no distress Neck: no carotid bruit, no JVD and thyroid not enlarged, symmetric, no tenderness/mass/nodules Lungs: clear to auscultation bilaterally Heart: regular rate and rhythm, S1, S2 normal and systolic murmur: holosystolic 3/6, blowing at 2nd right intercostal space Abdomen: soft, non-tender; bowel sounds normal; no masses,  no organomegaly Extremities: extremities normal, atraumatic, no cyanosis or edema Pulses: 2+ and symmetric Skin: Skin color, texture, turgor normal. No rashes or lesions Neurologic: Grossly normal Psych: Pleasant  EKG: Normal sinus rhythm at 70 - personally reviewed  ASSESSMENT: 1. Cryptogenic stroke-status post ILR 2. Essential hypertension 3. Dyslipidemia 4. Small perimembranous VSD 5. Mild pulmonary hypertension 6. Varicose veins  PLAN: 1.   [name] denies any recurrent stroke or TIA.  She has not had any events on her loop recorder.  She plans likely to leave it in after the battery dies.  Blood pressure is  excellent.  Her cholesterol is well treated and at goal LDL less than 70.  She does have a small perimembranous VSD which is inconsequential.  From a cardiac standpoint she is at acceptable risk for upcoming foot surgeries.  I  advised her to stop her aspirin for a minimum of 7 days to 10 days prior to the procedure and restart afterwards when safe from a bleeding standpoint.  Follow-up with me annually or sooner as necessary.  Pixie Casino, MD, [facility], [place] Director of the Advanced Lipid Disorders &   Cardiovascular Risk Reduction Clinic Diplomate of the American Board of Clinical Lipidology Attending Cardiologist  Direct Dial: [phone]  Fax: [phone]  Website:  [email or web address] 10/22/2020, 2:30 PM

## 2020-11-02 ENCOUNTER — Ambulatory Visit (INDEPENDENT_AMBULATORY_CARE_PROVIDER_SITE_OTHER): Payer: PPO

## 2020-11-02 DIAGNOSIS — I639 Cerebral infarction, unspecified: Secondary | ICD-10-CM

## 2020-11-02 LAB — CUP PACEART REMOTE DEVICE CHECK
Date Time Interrogation Session: 20211218231451
Implantable Pulse Generator Implant Date: 20191010

## 2020-11-16 NOTE — Progress Notes (Signed)
Carelink Summary Report / Loop Recorder 

## 2020-11-24 DIAGNOSIS — M2022 Hallux rigidus, left foot: Secondary | ICD-10-CM | POA: Diagnosis not present

## 2020-11-24 DIAGNOSIS — M2042 Other hammer toe(s) (acquired), left foot: Secondary | ICD-10-CM | POA: Diagnosis not present

## 2020-11-24 DIAGNOSIS — X58XXXA Exposure to other specified factors, initial encounter: Secondary | ICD-10-CM | POA: Diagnosis not present

## 2020-11-24 DIAGNOSIS — S93125A Dislocation of metatarsophalangeal joint of left lesser toe(s), initial encounter: Secondary | ICD-10-CM | POA: Diagnosis not present

## 2020-11-24 DIAGNOSIS — M7742 Metatarsalgia, left foot: Secondary | ICD-10-CM | POA: Diagnosis not present

## 2020-11-24 DIAGNOSIS — Y999 Unspecified external cause status: Secondary | ICD-10-CM | POA: Diagnosis not present

## 2020-12-04 LAB — CUP PACEART REMOTE DEVICE CHECK
Date Time Interrogation Session: 20220121005350
Implantable Pulse Generator Implant Date: 20191010

## 2020-12-07 ENCOUNTER — Ambulatory Visit (INDEPENDENT_AMBULATORY_CARE_PROVIDER_SITE_OTHER): Payer: PPO

## 2020-12-07 DIAGNOSIS — I639 Cerebral infarction, unspecified: Secondary | ICD-10-CM | POA: Diagnosis not present

## 2020-12-11 ENCOUNTER — Other Ambulatory Visit: Payer: Self-pay | Admitting: Cardiovascular Disease

## 2020-12-17 NOTE — Progress Notes (Signed)
Carelink Summary Report / Loop Recorder 

## 2021-01-04 DIAGNOSIS — M2022 Hallux rigidus, left foot: Secondary | ICD-10-CM | POA: Diagnosis not present

## 2021-01-04 DIAGNOSIS — Z4789 Encounter for other orthopedic aftercare: Secondary | ICD-10-CM | POA: Diagnosis not present

## 2021-01-07 LAB — CUP PACEART REMOTE DEVICE CHECK
Date Time Interrogation Session: 20220223021047
Implantable Pulse Generator Implant Date: 20191010

## 2021-01-11 ENCOUNTER — Ambulatory Visit (INDEPENDENT_AMBULATORY_CARE_PROVIDER_SITE_OTHER): Payer: PPO

## 2021-01-11 DIAGNOSIS — I639 Cerebral infarction, unspecified: Secondary | ICD-10-CM

## 2021-01-14 DIAGNOSIS — H52203 Unspecified astigmatism, bilateral: Secondary | ICD-10-CM | POA: Diagnosis not present

## 2021-01-14 DIAGNOSIS — H524 Presbyopia: Secondary | ICD-10-CM | POA: Diagnosis not present

## 2021-01-14 DIAGNOSIS — Z961 Presence of intraocular lens: Secondary | ICD-10-CM | POA: Diagnosis not present

## 2021-01-14 DIAGNOSIS — H5213 Myopia, bilateral: Secondary | ICD-10-CM | POA: Diagnosis not present

## 2021-01-18 NOTE — Progress Notes (Signed)
Carelink Summary Report / Loop Recorder 

## 2021-02-03 DIAGNOSIS — M2022 Hallux rigidus, left foot: Secondary | ICD-10-CM | POA: Diagnosis not present

## 2021-02-03 DIAGNOSIS — Z4789 Encounter for other orthopedic aftercare: Secondary | ICD-10-CM | POA: Diagnosis not present

## 2021-02-08 ENCOUNTER — Ambulatory Visit (INDEPENDENT_AMBULATORY_CARE_PROVIDER_SITE_OTHER): Payer: PPO

## 2021-02-08 DIAGNOSIS — I639 Cerebral infarction, unspecified: Secondary | ICD-10-CM | POA: Diagnosis not present

## 2021-02-08 LAB — CUP PACEART REMOTE DEVICE CHECK
Date Time Interrogation Session: 20220328031344
Implantable Pulse Generator Implant Date: 20191010

## 2021-02-18 NOTE — Progress Notes (Signed)
Carelink Summary Report / Loop Recorder 

## 2021-03-02 DIAGNOSIS — L851 Acquired keratosis [keratoderma] palmaris et plantaris: Secondary | ICD-10-CM | POA: Diagnosis not present

## 2021-03-02 DIAGNOSIS — M79671 Pain in right foot: Secondary | ICD-10-CM | POA: Diagnosis not present

## 2021-03-02 DIAGNOSIS — M7741 Metatarsalgia, right foot: Secondary | ICD-10-CM | POA: Diagnosis not present

## 2021-03-15 ENCOUNTER — Ambulatory Visit (INDEPENDENT_AMBULATORY_CARE_PROVIDER_SITE_OTHER): Payer: PPO

## 2021-03-15 DIAGNOSIS — I639 Cerebral infarction, unspecified: Secondary | ICD-10-CM

## 2021-03-24 LAB — CUP PACEART REMOTE DEVICE CHECK
Date Time Interrogation Session: 20220507230918
Implantable Pulse Generator Implant Date: 20191010

## 2021-04-02 NOTE — Progress Notes (Signed)
Carelink Summary Report / Loop Recorder 

## 2021-04-14 DIAGNOSIS — M7741 Metatarsalgia, right foot: Secondary | ICD-10-CM | POA: Diagnosis not present

## 2021-04-14 DIAGNOSIS — M79671 Pain in right foot: Secondary | ICD-10-CM | POA: Diagnosis not present

## 2021-04-14 DIAGNOSIS — Z4889 Encounter for other specified surgical aftercare: Secondary | ICD-10-CM | POA: Diagnosis not present

## 2021-04-19 ENCOUNTER — Ambulatory Visit (INDEPENDENT_AMBULATORY_CARE_PROVIDER_SITE_OTHER): Payer: PPO

## 2021-04-19 DIAGNOSIS — I639 Cerebral infarction, unspecified: Secondary | ICD-10-CM | POA: Diagnosis not present

## 2021-04-20 ENCOUNTER — Other Ambulatory Visit: Payer: Self-pay

## 2021-04-20 ENCOUNTER — Ambulatory Visit: Payer: PPO | Attending: Internal Medicine

## 2021-04-20 ENCOUNTER — Other Ambulatory Visit (HOSPITAL_BASED_OUTPATIENT_CLINIC_OR_DEPARTMENT_OTHER): Payer: Self-pay

## 2021-04-20 DIAGNOSIS — Z23 Encounter for immunization: Secondary | ICD-10-CM

## 2021-04-20 MED ORDER — PFIZER-BIONT COVID-19 VAC-TRIS 30 MCG/0.3ML IM SUSP
INTRAMUSCULAR | 0 refills | Status: DC
Start: 2021-04-20 — End: 2022-08-22
  Filled 2021-04-20: qty 0.3, 1d supply, fill #0

## 2021-04-20 NOTE — Progress Notes (Signed)
   Covid-19 Vaccination Clinic  Name:  [name]    MRN: [id number] DOB: 10-Sep-1941  04/20/2021  [name] was observed post Covid-19 immunization for 15 minutes without incident. She was provided with Vaccine Information Sheet and instruction to access the V-Safe system.   [name] was instructed to call 911 with any severe reactions post vaccine: Marland Kitchen Difficulty breathing  . Swelling of face and throat  . A fast heartbeat  . A bad rash all over body  . Dizziness and weakness   Immunizations Administered    Name Date Dose VIS Date Route   PFIZER Comrnaty(Gray TOP) Covid-19 Vaccine 04/20/2021  9:28 AM 0.3 mL 10/22/2020 Intramuscular   Manufacturer: [place]   Lot: [id number]   [place]: [phone]

## 2021-04-22 DIAGNOSIS — L812 Freckles: Secondary | ICD-10-CM | POA: Diagnosis not present

## 2021-04-22 DIAGNOSIS — L821 Other seborrheic keratosis: Secondary | ICD-10-CM | POA: Diagnosis not present

## 2021-04-22 DIAGNOSIS — D692 Other nonthrombocytopenic purpura: Secondary | ICD-10-CM | POA: Diagnosis not present

## 2021-04-22 DIAGNOSIS — Z85828 Personal history of other malignant neoplasm of skin: Secondary | ICD-10-CM | POA: Diagnosis not present

## 2021-04-22 DIAGNOSIS — L570 Actinic keratosis: Secondary | ICD-10-CM | POA: Diagnosis not present

## 2021-04-23 LAB — CUP PACEART REMOTE DEVICE CHECK
Date Time Interrogation Session: 20220609231533
Implantable Pulse Generator Implant Date: 20191010

## 2021-05-10 NOTE — Progress Notes (Signed)
Carelink Summary Report / Loop Recorder 

## 2021-05-19 DIAGNOSIS — Z96643 Presence of artificial hip joint, bilateral: Secondary | ICD-10-CM | POA: Diagnosis not present

## 2021-05-19 DIAGNOSIS — Z96641 Presence of right artificial hip joint: Secondary | ICD-10-CM | POA: Diagnosis not present

## 2021-05-19 DIAGNOSIS — Z96642 Presence of left artificial hip joint: Secondary | ICD-10-CM | POA: Diagnosis not present

## 2021-05-20 DIAGNOSIS — M65342 Trigger finger, left ring finger: Secondary | ICD-10-CM | POA: Diagnosis not present

## 2021-05-20 DIAGNOSIS — M19041 Primary osteoarthritis, right hand: Secondary | ICD-10-CM | POA: Diagnosis not present

## 2021-05-24 ENCOUNTER — Ambulatory Visit (INDEPENDENT_AMBULATORY_CARE_PROVIDER_SITE_OTHER): Payer: PPO

## 2021-05-24 DIAGNOSIS — I639 Cerebral infarction, unspecified: Secondary | ICD-10-CM | POA: Diagnosis not present

## 2021-05-26 DIAGNOSIS — Z4789 Encounter for other orthopedic aftercare: Secondary | ICD-10-CM | POA: Diagnosis not present

## 2021-05-26 DIAGNOSIS — M2021 Hallux rigidus, right foot: Secondary | ICD-10-CM | POA: Diagnosis not present

## 2021-05-26 LAB — CUP PACEART REMOTE DEVICE CHECK
Date Time Interrogation Session: 20220712232741
Implantable Pulse Generator Implant Date: 20191010

## 2021-05-28 ENCOUNTER — Other Ambulatory Visit: Payer: Self-pay

## 2021-05-28 ENCOUNTER — Ambulatory Visit: Payer: PPO | Admitting: Cardiovascular Disease

## 2021-05-28 ENCOUNTER — Encounter: Payer: Self-pay | Admitting: Cardiovascular Disease

## 2021-05-28 VITALS — BP 150/68 | HR 64 | Ht 60.0 in | Wt 104.8 lb

## 2021-05-28 DIAGNOSIS — I10 Essential (primary) hypertension: Secondary | ICD-10-CM

## 2021-05-28 DIAGNOSIS — Z9889 Other specified postprocedural states: Secondary | ICD-10-CM | POA: Diagnosis not present

## 2021-05-28 DIAGNOSIS — Q210 Ventricular septal defect: Secondary | ICD-10-CM

## 2021-05-28 DIAGNOSIS — I358 Other nonrheumatic aortic valve disorders: Secondary | ICD-10-CM

## 2021-05-28 DIAGNOSIS — I455 Other specified heart block: Secondary | ICD-10-CM | POA: Diagnosis not present

## 2021-05-28 DIAGNOSIS — I639 Cerebral infarction, unspecified: Secondary | ICD-10-CM | POA: Diagnosis not present

## 2021-05-28 DIAGNOSIS — E782 Mixed hyperlipidemia: Secondary | ICD-10-CM

## 2021-05-28 NOTE — Patient Instructions (Signed)

## 2021-05-28 NOTE — Progress Notes (Signed)
Cardiology Office Note:    Date:  05/28/2021   ID:  [name], DOB [birth year], MRN [id number]  PCP:  Ginger Organ., MD  [facility] Cardiologist:  Pixie Casino, MD  [facility] Electrophysiologist:  None   Referring MD: Ginger Organ., MD   Chief Complaint  Patient presents with   sinus pause     History of Present Illness:    [name] is a 80 y.o. female with a hx of a tiny restrictive membranous VSD, mild pulmonary artery hypertension, cryptogenic stroke 2019, status post loop recorder which has not shown atrial fibrillation, but did record a single 4-second episode of regular paroxysmal atrial tachycardia in April 2021 and a 5-second sinus pause in July 2021.  She has not had any new neurological events and denies syncope or palpitations.  She had her last echocardiogram in 2019 which was a limited saline contrast study to exclude intracardiac shunt as a cause for her stroke.  There was clear evidence of aortic valve sclerosis at that time.  Doppler study was not performed.  She did not have aortic stenosis on her previous echocardiogram in 2016.  She had very mild (less than 40%) stenosis in bilateral carotid arteries on duplex ultrasonography performed same year.  On chronic aspirin and statin therapy and very low-dose ACE inhibitor for hypertension.  Blood pressure is a little high today, but usually much lower at home in the 130/60 range.  Past Medical History:  Diagnosis Date   Arthritis    Heart murmur    Stroke [facility])     Past Surgical History:  Procedure Laterality Date   ABDOMINAL HYSTERECTOMY     age 65   APPENDECTOMY     DOPPLER ECHOCARDIOGRAPHY  2013   Duplex Doppler  2013   EXCISIONAL TOTAL HIP ARTHROPLASTY WITH ANTIBIOTIC SPACERS  09/25/2012   Procedure: EXCISIONAL TOTAL HIP ARTHROPLASTY WITH ANTIBIOTIC SPACERS;  Surgeon: [name], MD;  Location: [facility];  Service: Orthopedics;  Laterality: Left;  RESECTION LEFT TOTAL  HIP ARTHROPLASTY AND PLACEMENT OF ANTIBIOTIC SPACERs   EYE SURGERY  02/2010   bilateral cataracts   INCISION AND DRAINAGE HIP  11/11/2011   Procedure: IRRIGATION AND DEBRIDEMENT HIP WITH POLY EXCHANGE;  Surgeon: [name];  Location: [facility];  Service: Orthopedics;  Laterality: Left;  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   Calcium Carbonate-Vitamin D (CALCIUM + D PO) Take 1 tablet by mouth daily.    COVID-19 mRNA Vac-TriS, Pfizer, (PFIZER-BIONT COVID-19 VAC-TRIS) SUSP injection Inject into the muscle.   lisinopril (ZESTRIL) 5 MG tablet TAKE 1 TABLET BY MOUTH EVERY DAY   MYRBETRIQ 50 MG TB24 Take 50 mg by mouth daily.    rosuvastatin (CRESTOR) 10 MG tablet Take 10 mg by mouth daily.     Allergies:   Bactrim   Social History   Socioeconomic History   Marital status: Married    Spouse name: Not on file   Number of children: Not  on file   Years of education: Not on file   Highest education level: Not on file  Occupational History   Not on file  Tobacco Use   Smoking status: Never   Smokeless tobacco: Never  Substance and Sexual Activity   Alcohol use: Yes    Alcohol/week: 1.0 standard drink    Types: 1 Glasses of wine per week    Comment: occ.   Drug use: No   Sexual activity: Not on file  Other Topics Concern   Not on file  Social History Narrative   Not on file   Social Determinants of Health   Financial Resource Strain: Not on file  Food Insecurity: Not on file  Transportation Needs: Not on file  Physical  Activity: Not on file  Stress: Not on file  Social Connections: Not on file     Family History: The patient's family history includes Failure to thrive in her mother; Stroke in her father.  ROS:   Please see the history of present illness.     All other systems reviewed and are negative.  EKGs/Labs/Other Studies Reviewed:    The following studies were reviewed today: Loop recorder download performed within the last couple of days showing no episodes of arrhythmia  EKG:  EKG is ordered today.  It shows normal sinus rhythm and questionable left atrial abnormality, otherwise normal  Recent Labs: No results found for requested labs within last 8760 hours.  Recent Lipid Panel    Component Value Date/Time   CHOL 197 07/09/2018 1343   TRIG 76 07/09/2018 1343   HDL 83 07/09/2018 1343   CHOLHDL 2.4 07/09/2018 1343   LDLCALC 99 07/09/2018 1343  Labs performed in 2022 and Dr. Raul Del office Cholesterol 151, triglycerides 49, HDL 83, LDL 58 Creatinine 0.8, potassium 5.1, hemoglobin 12.9, TSH 1.7, normal liver function test  Physical Exam:    VS:  BP (!) 150/68 (BP Location: Left Arm)   Pulse 64   Ht 5' (1.524 m)   Wt 104 lb 12.8 oz (47.5 kg)   BMI 20.47 kg/m     Wt Readings from Last 3 Encounters:  05/28/21 104 lb 12.8 oz (47.5 kg)  10/22/20 104 lb 9.6 oz (47.4 kg)  05/26/20 105 lb 9.6 oz (47.9 kg)      General: Alert, oriented x3, no distress, appears lean and fit Head: no evidence of trauma, PERRL, EOMI, no exophtalmos or lid lag, no myxedema, no xanthelasma; normal ears, nose and oropharynx Neck: normal jugular venous pulsations and no hepatojugular reflux; brisk carotid pulses without delay.  There are loud bilateral carotid bruits that seem to radiate from the chest Chest: clear to auscultation, no signs of consolidation by percussion or palpation, normal fremitus, symmetrical and full respiratory excursions Cardiovascular: normal position and quality of the apical  impulse, regular rhythm, normal first and second heart sounds, 3/6 holosystolic murmur heard broadly throughout the entire precordial area and her back 2/6 early peaking crescendo decrescendo murmur heard best in the aortic focus radiating towards the carotids, no diastolic murmurs, rubs or gallops Abdomen: no tenderness or distention, no masses by palpation, no abnormal pulsatility or arterial bruits, normal bowel sounds, no hepatosplenomegaly Extremities: no clubbing, cyanosis or edema; 2+ radial, ulnar and brachial pulses bilaterally; 2+ right femoral, posterior tibial and dorsalis pedis pulses; 2+ left femoral, posterior tibial and dorsalis pedis pulses; no subclavian or femoral bruits Neurological: grossly nonfocal Psych: Normal mood and affect   ASSESSMENT:    1. Hypertension, essential  PLAN:    In order of problems listed above:  Sinus pause: Asymptomatic events recorded once on her loop recorder.  None since in about a year.  Does not require pacemaker.  In the absence of symptoms, I do not think she needs a new loop recorder when the current one which is end of service. Hx CVA: In 3 years of monitoring atrial fibrillation has not been seen.  Continue aspirin. ILR: Continue monthly downloads for the time being, but the device is expected to reach end of service soon. HTN: Well-controlled on home blood pressure checks. HLP: Excellent lipid profile on statin. VSD: Very loud murmur, but hemodynamically insignificant. Aortic valve sclerosis: She has a different systolic murmur in aortic focus radiating towards the carotids, with a typical crescendo-decrescendo pattern.  I think it is quite likely she has developed full-blown aortic stenosis, but this is likely to be mild.  Does not have any exertional symptoms.  Unlikely to have developed clinically meaningful aortic stenosis yet, but this is a possibility going forward and I would recommend a repeat echocardiogram in another couple of  years.   Medication Adjustments/Labs and Tests Ordered: Current medicines are reviewed at length with the patient today.  Concerns regarding medicines are outlined above.  Orders Placed This Encounter  Procedures   EKG 12-Lead    No orders of the defined types were placed in this encounter.   Patient Instructions  Medication Instructions:  No changes *If you need a refill on your cardiac medications before your next appointment, please call your pharmacy*   Lab Work: None ordered If you have labs (blood work) drawn today and your tests are completely normal, you will receive your results only by: [place] (if you have MyChart) OR A paper copy in the mail If you have any lab test that is abnormal or we need to change your treatment, we will call you to review the results.   Testing/Procedures: None ordered   Follow-Up: At [facility], you and your health needs are our priority.  As part of our continuing mission to provide you with exceptional heart care, we have created designated Provider Care Teams.  These Care Teams include your primary Cardiologist (physician) and Advanced Practice Providers (APPs -  Physician Assistants and Nurse Practitioners) who all work together to provide you with the care you need, when you need it.  We recommend signing up for the patient portal called "MyChart".  Sign up information is provided on this After Visit Summary.  MyChart is used to connect with patients for Virtual Visits (Telemedicine).  Patients are able to view lab/test results, encounter notes, upcoming appointments, etc.  Non-urgent messages can be sent to your provider as well.   To learn more about what you can do with MyChart, go to [email or web address].    Your next appointment:   12 month(s)  The format for your next appointment:   In Person  Provider:   [name], MD     Signed, [name], MD  05/28/2021 10:32 AM    [place]

## 2021-06-15 NOTE — Progress Notes (Signed)
Carelink Summary Report / Loop Recorder 

## 2021-06-28 ENCOUNTER — Ambulatory Visit (INDEPENDENT_AMBULATORY_CARE_PROVIDER_SITE_OTHER): Payer: PPO

## 2021-06-28 DIAGNOSIS — I639 Cerebral infarction, unspecified: Secondary | ICD-10-CM

## 2021-06-28 LAB — CUP PACEART REMOTE DEVICE CHECK
Date Time Interrogation Session: 20220814233851
Implantable Pulse Generator Implant Date: 20191010

## 2021-07-16 NOTE — Progress Notes (Signed)
Carelink Summary Report / Loop Recorder 

## 2021-08-02 ENCOUNTER — Other Ambulatory Visit (HOSPITAL_BASED_OUTPATIENT_CLINIC_OR_DEPARTMENT_OTHER): Payer: Self-pay

## 2021-08-02 ENCOUNTER — Ambulatory Visit (INDEPENDENT_AMBULATORY_CARE_PROVIDER_SITE_OTHER): Payer: PPO

## 2021-08-02 DIAGNOSIS — I639 Cerebral infarction, unspecified: Secondary | ICD-10-CM | POA: Diagnosis not present

## 2021-08-03 LAB — CUP PACEART REMOTE DEVICE CHECK
Date Time Interrogation Session: 20220916234016
Implantable Pulse Generator Implant Date: 20191010

## 2021-08-04 ENCOUNTER — Other Ambulatory Visit (HOSPITAL_BASED_OUTPATIENT_CLINIC_OR_DEPARTMENT_OTHER): Payer: Self-pay

## 2021-08-04 ENCOUNTER — Ambulatory Visit: Payer: PPO | Attending: Internal Medicine

## 2021-08-04 DIAGNOSIS — Z23 Encounter for immunization: Secondary | ICD-10-CM

## 2021-08-04 MED ORDER — PFIZER COVID-19 VAC BIVALENT 30 MCG/0.3ML IM SUSP
INTRAMUSCULAR | 0 refills | Status: DC
  Filled 2021-08-04: qty 0.3, 1d supply, fill #0

## 2021-08-04 MED ORDER — INFLUENZA VAC A&B SA ADJ QUAD 0.5 ML IM PRSY
PREFILLED_SYRINGE | INTRAMUSCULAR | 0 refills | Status: DC
  Filled 2021-08-04: qty 0.5, 1d supply, fill #0

## 2021-08-04 NOTE — Progress Notes (Signed)
   Covid-19 Vaccination Clinic  Name:  [name]    MRN: [id number] DOB: Apr 30, 1941  08/04/2021  [name] was observed post Covid-19 immunization for 15 minutes without incident. She was provided with Vaccine Information Sheet and instruction to access the V-Safe system.   [name] was instructed to call 911 with any severe reactions post vaccine: Difficulty breathing  Swelling of face and throat  A fast heartbeat  A bad rash all over body  Dizziness and weakness

## 2021-08-05 DIAGNOSIS — N3281 Overactive bladder: Secondary | ICD-10-CM | POA: Diagnosis not present

## 2021-08-06 NOTE — Progress Notes (Signed)
Carelink Summary Report / Loop Recorder 

## 2021-08-17 DIAGNOSIS — N3281 Overactive bladder: Secondary | ICD-10-CM | POA: Diagnosis not present

## 2021-08-17 DIAGNOSIS — M6289 Other specified disorders of muscle: Secondary | ICD-10-CM | POA: Diagnosis not present

## 2021-08-17 DIAGNOSIS — M6281 Muscle weakness (generalized): Secondary | ICD-10-CM | POA: Diagnosis not present

## 2021-08-17 DIAGNOSIS — R351 Nocturia: Secondary | ICD-10-CM | POA: Diagnosis not present

## 2021-08-17 DIAGNOSIS — M62838 Other muscle spasm: Secondary | ICD-10-CM | POA: Diagnosis not present

## 2021-08-17 DIAGNOSIS — N3941 Urge incontinence: Secondary | ICD-10-CM | POA: Diagnosis not present

## 2021-08-24 DIAGNOSIS — R3915 Urgency of urination: Secondary | ICD-10-CM | POA: Diagnosis not present

## 2021-08-24 DIAGNOSIS — M62838 Other muscle spasm: Secondary | ICD-10-CM | POA: Diagnosis not present

## 2021-08-24 DIAGNOSIS — M6289 Other specified disorders of muscle: Secondary | ICD-10-CM | POA: Diagnosis not present

## 2021-08-24 DIAGNOSIS — M6281 Muscle weakness (generalized): Secondary | ICD-10-CM | POA: Diagnosis not present

## 2021-08-24 DIAGNOSIS — R351 Nocturia: Secondary | ICD-10-CM | POA: Diagnosis not present

## 2021-09-06 ENCOUNTER — Ambulatory Visit (INDEPENDENT_AMBULATORY_CARE_PROVIDER_SITE_OTHER): Payer: PPO

## 2021-09-06 DIAGNOSIS — I639 Cerebral infarction, unspecified: Secondary | ICD-10-CM

## 2021-09-06 LAB — CUP PACEART REMOTE DEVICE CHECK
Date Time Interrogation Session: 20221019234406
Implantable Pulse Generator Implant Date: 20191010

## 2021-09-14 NOTE — Progress Notes (Signed)
Carelink Summary Report / Loop Recorder 

## 2021-09-20 DIAGNOSIS — Z6821 Body mass index (BMI) 21.0-21.9, adult: Secondary | ICD-10-CM | POA: Diagnosis not present

## 2021-09-20 DIAGNOSIS — Z01419 Encounter for gynecological examination (general) (routine) without abnormal findings: Secondary | ICD-10-CM | POA: Diagnosis not present

## 2021-09-20 DIAGNOSIS — Z1231 Encounter for screening mammogram for malignant neoplasm of breast: Secondary | ICD-10-CM | POA: Diagnosis not present

## 2021-10-11 ENCOUNTER — Ambulatory Visit (INDEPENDENT_AMBULATORY_CARE_PROVIDER_SITE_OTHER): Payer: PPO

## 2021-10-11 DIAGNOSIS — I639 Cerebral infarction, unspecified: Secondary | ICD-10-CM

## 2021-10-11 LAB — CUP PACEART REMOTE DEVICE CHECK
Date Time Interrogation Session: 20221121224516
Implantable Pulse Generator Implant Date: 20191010

## 2021-10-19 NOTE — Progress Notes (Signed)
Carelink Summary Report / Loop Recorder 

## 2021-11-09 ENCOUNTER — Ambulatory Visit (INDEPENDENT_AMBULATORY_CARE_PROVIDER_SITE_OTHER): Payer: PPO

## 2021-11-09 DIAGNOSIS — I639 Cerebral infarction, unspecified: Secondary | ICD-10-CM

## 2021-11-09 LAB — CUP PACEART REMOTE DEVICE CHECK
Date Time Interrogation Session: 20221224224549
Implantable Pulse Generator Implant Date: 20191010

## 2021-11-17 NOTE — Progress Notes (Signed)
Carelink Summary Report / Loop Recorder 

## 2021-11-19 DIAGNOSIS — I10 Essential (primary) hypertension: Secondary | ICD-10-CM | POA: Diagnosis not present

## 2021-11-19 DIAGNOSIS — E785 Hyperlipidemia, unspecified: Secondary | ICD-10-CM | POA: Diagnosis not present

## 2021-11-19 DIAGNOSIS — M859 Disorder of bone density and structure, unspecified: Secondary | ICD-10-CM | POA: Diagnosis not present

## 2021-11-26 DIAGNOSIS — E785 Hyperlipidemia, unspecified: Secondary | ICD-10-CM | POA: Diagnosis not present

## 2021-11-26 DIAGNOSIS — I10 Essential (primary) hypertension: Secondary | ICD-10-CM | POA: Diagnosis not present

## 2021-11-26 DIAGNOSIS — R82998 Other abnormal findings in urine: Secondary | ICD-10-CM | POA: Diagnosis not present

## 2021-11-26 DIAGNOSIS — N3281 Overactive bladder: Secondary | ICD-10-CM | POA: Diagnosis not present

## 2021-11-26 DIAGNOSIS — M8580 Other specified disorders of bone density and structure, unspecified site: Secondary | ICD-10-CM | POA: Diagnosis not present

## 2021-11-26 DIAGNOSIS — Z1331 Encounter for screening for depression: Secondary | ICD-10-CM | POA: Diagnosis not present

## 2021-11-26 DIAGNOSIS — Q210 Ventricular septal defect: Secondary | ICD-10-CM | POA: Diagnosis not present

## 2021-11-26 DIAGNOSIS — Z8673 Personal history of transient ischemic attack (TIA), and cerebral infarction without residual deficits: Secondary | ICD-10-CM | POA: Diagnosis not present

## 2021-11-26 DIAGNOSIS — Z0000 Encounter for general adult medical examination without abnormal findings: Secondary | ICD-10-CM | POA: Diagnosis not present

## 2021-11-26 DIAGNOSIS — Z1339 Encounter for screening examination for other mental health and behavioral disorders: Secondary | ICD-10-CM | POA: Diagnosis not present

## 2021-11-27 ENCOUNTER — Other Ambulatory Visit: Payer: Self-pay | Admitting: Internal Medicine

## 2021-12-20 ENCOUNTER — Ambulatory Visit (INDEPENDENT_AMBULATORY_CARE_PROVIDER_SITE_OTHER): Payer: PPO

## 2021-12-20 DIAGNOSIS — I639 Cerebral infarction, unspecified: Secondary | ICD-10-CM

## 2021-12-20 LAB — CUP PACEART REMOTE DEVICE CHECK
Date Time Interrogation Session: 20230205233129
Implantable Pulse Generator Implant Date: 20191010

## 2021-12-22 NOTE — Progress Notes (Signed)
Carelink Summary Report / Loop Recorder 

## 2021-12-23 ENCOUNTER — Ambulatory Visit: Payer: PPO | Admitting: General Practice

## 2022-01-24 ENCOUNTER — Ambulatory Visit (INDEPENDENT_AMBULATORY_CARE_PROVIDER_SITE_OTHER): Payer: PPO

## 2022-01-24 DIAGNOSIS — I639 Cerebral infarction, unspecified: Secondary | ICD-10-CM

## 2022-01-24 LAB — CUP PACEART REMOTE DEVICE CHECK
Date Time Interrogation Session: 20230312231107
Implantable Pulse Generator Implant Date: 20191010

## 2022-02-01 DIAGNOSIS — H35373 Puckering of macula, bilateral: Secondary | ICD-10-CM | POA: Diagnosis not present

## 2022-02-01 DIAGNOSIS — H5213 Myopia, bilateral: Secondary | ICD-10-CM | POA: Diagnosis not present

## 2022-02-01 DIAGNOSIS — Z961 Presence of intraocular lens: Secondary | ICD-10-CM | POA: Diagnosis not present

## 2022-02-01 DIAGNOSIS — H52223 Regular astigmatism, bilateral: Secondary | ICD-10-CM | POA: Diagnosis not present

## 2022-02-01 DIAGNOSIS — H35342 Macular cyst, hole, or pseudohole, left eye: Secondary | ICD-10-CM | POA: Diagnosis not present

## 2022-02-01 DIAGNOSIS — H524 Presbyopia: Secondary | ICD-10-CM | POA: Diagnosis not present

## 2022-02-03 DIAGNOSIS — M79602 Pain in left arm: Secondary | ICD-10-CM | POA: Diagnosis not present

## 2022-02-04 NOTE — Progress Notes (Signed)
Carelink Summary Report / Loop Recorder 

## 2022-02-28 ENCOUNTER — Ambulatory Visit (INDEPENDENT_AMBULATORY_CARE_PROVIDER_SITE_OTHER): Payer: PPO

## 2022-02-28 DIAGNOSIS — I639 Cerebral infarction, unspecified: Secondary | ICD-10-CM

## 2022-02-28 LAB — CUP PACEART REMOTE DEVICE CHECK
Date Time Interrogation Session: 20230414231238
Implantable Pulse Generator Implant Date: 20191010

## 2022-03-02 ENCOUNTER — Ambulatory Visit: Payer: PPO | Admitting: Internal Medicine

## 2022-03-02 ENCOUNTER — Encounter: Payer: Self-pay | Admitting: Internal Medicine

## 2022-03-02 VITALS — BP 135/62 | HR 65 | Ht 60.0 in | Wt 103.6 lb

## 2022-03-02 DIAGNOSIS — Z9889 Other specified postprocedural states: Secondary | ICD-10-CM | POA: Diagnosis not present

## 2022-03-02 DIAGNOSIS — L97329 Non-pressure chronic ulcer of left ankle with unspecified severity: Secondary | ICD-10-CM

## 2022-03-02 DIAGNOSIS — Z8673 Personal history of transient ischemic attack (TIA), and cerebral infarction without residual deficits: Secondary | ICD-10-CM

## 2022-03-02 DIAGNOSIS — I83009 Varicose veins of unspecified lower extremity with ulcer of unspecified site: Secondary | ICD-10-CM

## 2022-03-02 DIAGNOSIS — L97909 Non-pressure chronic ulcer of unspecified part of unspecified lower leg with unspecified severity: Secondary | ICD-10-CM | POA: Diagnosis not present

## 2022-03-02 DIAGNOSIS — I83023 Varicose veins of left lower extremity with ulcer of ankle: Secondary | ICD-10-CM | POA: Diagnosis not present

## 2022-03-02 DIAGNOSIS — Q210 Ventricular septal defect: Secondary | ICD-10-CM

## 2022-03-02 NOTE — Progress Notes (Signed)
? ? ?OFFICE NOTE ? ?Chief Complaint:  ?Follow-up, leg wound ? ?Primary Care Physician: ?Ginger Organ., MD ? ?HPI:  ?[name] is a pleasant 81 year old female who was previously followed by Dr. Rex Kras. I last saw her in April 2014 for follow-up of a small membranous VSD. She also has very mild pulmonary hypertension. She does have of course a loud murmur which is associated with small VSDs. Her last echo was in 13 and EF was 55% or greater at the time. Normal wall motion was noted. There was mild mitral annular calcification with mild to moderate mitral regurgitation. There was mild tricuspid regurgitation as well. She is overall asymptomatic. She denies chest pain or worsening shortness of breath. She is on limited medications including aspirin, calcium and Myrbetriq. ? ?03/21/2016 ? ?[name] returns today for follow-up. She's done extremely well. 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 In the past she's had mild pulmonary hypertension however her echo last year was not able to estimate pulmonary pressures. Her EF is remains stable. Overall she is doing well without any symptoms of shortness of breath or exercise intolerance. She denies any chest pain. She does have lower extremity varicose veins and has a history of vein stripping in the past. She occasionally gets some swelling of her legs. ? ?04/11/2018 ? ?[name] was seen today in follow-up.'s been 2 years since I last saw her.  She has a stable small membranous VSD.  EF was unchanged by echo in 2017.  She denies worsening shortness of breath or chest pain or any change in exercise capacity.  She still struggles with varicose veins.  She also has some toe deformity on the left foot and is contemplating surgery.  She rarely gets swelling of her legs.  Interestingly her blood pressure has been elevated.  It was notably high in her PCPs office in December at 181/90.  She also saw her GYN who noted her blood  pressure was elevated.  In addition her blood pressure is elevated here.  She has a list of blood pressure she took at home which I reviewed.  While there are occasional blood pressures in the low 100s in general they are between 093-818 systolic.  Overall, I feel that she may be developing some hypertension, possibly due to arterial sclerosis or aging.  Her PCP had considered treating her blood pressure.  I think it is time for her to be treated.  Labs reviewed from November 2018 showed total cholesterol 205, HDL 79, LDL 114 and triglycerides 60. ? ?07/03/2018 ? ?[name] returns today for follow-up.  She called in over the weekend noting that she had had some recent speech difficulty.  Her speech was garbled and it was noted by her husband Regino Schultze, MD who is a retired Publishing rights manager.  The episode was transient although very upsetting for her.  She had been on aspirin in the past but has not recently taken it.  In fact she is only on a few medications.  She is compliant with lisinopril and has seen a marked improvement in blood pressure 136/60.  According to her husband who did a brief neurologic exam she had no focal neurologic deficits.  She does have a history of VSD but no known atrial septal defect or PFO.  In addition she had mild to moderate bilateral carotid artery disease by ultrasound in 2016.  Denies any palpitations or arrhythmias that she is aware of. ? ?  08/20/2018 ? ?[name] returns today for follow-up.  She underwent 30-day monitoring which demonstrated a short run of tachycardia which was mildly irregular concerning for possible A. fib however duration was less than a few seconds.  She did not have any other episodes.  She did have an MRI which showed a hypointense lesion in the left parietal area suggestive of possible subacute stroke.  This may fit with her symptom pattern.  She is currently on aspirin however I am highly suspicious that this may have been a cardioembolic event.  I agree  with her neurologist recommendation to start Crestor 5 mg with goal LDL less than 70.  Based on the fact that there was very infrequent A. fib, there is not enough evidence to consider starting long-term anticoagulation at this point.  However due to my increased suspicions, I think she is a good candidate for an implanted loop recorder. ? ?11/27/2018 ? ?[name] is seen today for routine follow-up as well as preoperative risk assessment.  She is doing well without any obvious recurrence of atrial fibrillation.  She did have an implanted loop recorder placed and to interrogation so far not demonstrated any A. fib.  She has been struggling with hip pain and is contemplating surgery in the next few weeks by Dr. Cornelia Copa and Baldo Ash.  From a cardiac standpoint I feel that she is acceptable risk for surgery.  We did perform an EKG today which shows normal sinus rhythm.  She was asked to hold her aspirin prior to the procedure and I would recommend holding it for a minimum of 7 days. ? ?11/27/2019 ? ?[name] returns today for follow-up.  I recently had seen her for virtual visit.  I increased her lisinopril some more for elevated blood pressure.  She says at home it still runs in the 130s to 140s.  Currently she is only on 10 mg daily however she reports has been having some dry cough intermittently which could be side effect.  After an increase of her rosuvastatin by her PCP, her LDL now is 75, total cholesterol 166, triglycerides 47 HDL 82.  She denies any further stroke or TIA events.  Her implanted loop recorder has failed to show any significant arrhythmias. ? ?10/22/2020 ? ?[name] is seen today in follow-up.  She continues to do well.  She has an upcoming surgery however with Dr.Hewitt due to concerns about arthritis and difficulty walking with her feet.  She has had interval improvement in her numbers.  Her cholesterol is down even further with a total 151, triglycerides 83, HDL 49 and LDL 58.  Her APO B  was 51 and A1c 4.9, these labs coming from Dr. Raul Del office at [facility].  From a cardiac standpoint she should be okay to go ahead with her surgery. ? ?03/02/2022 ? ?[name] returns today for follow-up.  Overall she seems to be doing fairly well.  She says she has not had any further TIA or stroke events although does have a fear of this happening.  Her implanted loop recorder continues to have battery life more than 3 years out and she is still having remote pacer checks.  These of all demonstrated no arrhythmias.  She tells me today that several weeks ago she had injury where she dropped a vacuum cleaner extension on her left leg.  Subsequent to that she developed a hematoma.  Then she was seen at orthopedic urgent care.  They did x-rays but no other work  was done.  The hematoma then opened up and she developed a sore on the leg.  She has been putting mupirocin on it and covering with a Band-Aid.  I evaluated it today.  This appears to be a venous ulcer which is slowly healing.  She has notable lower extremity varicosities and this is in the area of the left gaiter area. ? ?PMHx:  ?Past Medical History:  ?Diagnosis Date  ? Arthritis   ? Heart murmur   ? Stroke [facility])   ? ? ?Past Surgical History:  ?Procedure Laterality Date  ? ABDOMINAL HYSTERECTOMY    ? age 55  ? APPENDECTOMY    ? DOPPLER ECHOCARDIOGRAPHY  2013  ? Duplex Doppler  2013  ? EXCISIONAL TOTAL HIP ARTHROPLASTY WITH ANTIBIOTIC SPACERS  09/25/2012  ? Procedure: EXCISIONAL TOTAL HIP ARTHROPLASTY WITH ANTIBIOTIC SPACERS;  Surgeon: [name], MD;  Location: [facility];  Service: Orthopedics;  Laterality: Left;  RESECTION LEFT TOTAL HIP ARTHROPLASTY AND PLACEMENT OF ANTIBIOTIC SPACERs  ? EYE SURGERY  02/2010  ? bilateral cataracts  ? INCISION AND DRAINAGE HIP  11/11/2011  ? Procedure: IRRIGATION AND DEBRIDEMENT HIP WITH POLY EXCHANGE;  Surgeon: [name];  Location: [facility];  Service: Orthopedics;  Laterality: Left;  Irrigation and  Debridement of Infected Left Total Hip with Exchange Component  ? JOINT REPLACEMENT  2006  ? left hip, revision 2009 and 07/2011  ? JOINT REPLACEMENT  10/2007  ? right hip, revision 12/2008  ? LOOP RECORDER INSERTIO

## 2022-03-02 NOTE — Patient Instructions (Signed)
Medication Instructions:  ?Your physician recommends that you continue on your current medications as directed. Please refer to the Current Medication list given to you today. ? ?*If you need a refill on your cardiac medications before your next appointment, please call your pharmacy* ? ?Lab Work: ?NONE ordered at this time of appointment  ? ?If you have labs (blood work) drawn today and your tests are completely normal, you will receive your results only by: ?MyChart Message (if you have MyChart) OR ?A paper copy in the mail ?If you have any lab test that is abnormal or we need to change your treatment, we will call you to review the results. ? ?Testing/Procedures: ?You have been referred to Wound Clinic  ? ?Follow-Up: ?At [facility], you and your health needs are our priority.  As part of our continuing mission to provide you with exceptional heart care, we have created designated Provider Care Teams.  These Care Teams include your primary Cardiologist (physician) and Advanced Practice Providers (APPs -  Physician Assistants and Nurse Practitioners) who all work together to provide you with the care you need, when you need it. ? ?Your next appointment:   ?1 year(s) ? ?The format for your next appointment:   ?In Person ? ?Provider:   ?Pixie Casino, MD   ? ?Other Instructions ? ? ?Important Information About [place] ? ? ? ? ? ? ?

## 2022-03-03 ENCOUNTER — Encounter: Payer: Self-pay | Admitting: Internal Medicine

## 2022-03-03 NOTE — Telephone Encounter (Signed)
Patient had a question about care for her leg wound. She stated the wound is approximately the size of a nickel, is not red, hot, swollen or draining. She denies fever.The skin around the wound is dry and peeling. Recommended that she use telfa pads, avoid tape (use kling to hold telfa in place), and keep wound dry. Also recommended that she call her PCP or the wound care office for advice. Patient voiced understanding to call her PCP if signs of infection, as mentioned above, occur. ?

## 2022-03-04 DIAGNOSIS — Q210 Ventricular septal defect: Secondary | ICD-10-CM | POA: Diagnosis not present

## 2022-03-04 DIAGNOSIS — L97929 Non-pressure chronic ulcer of unspecified part of left lower leg with unspecified severity: Secondary | ICD-10-CM | POA: Diagnosis not present

## 2022-03-04 DIAGNOSIS — L03116 Cellulitis of left lower limb: Secondary | ICD-10-CM | POA: Diagnosis not present

## 2022-03-04 DIAGNOSIS — I87312 Chronic venous hypertension (idiopathic) with ulcer of left lower extremity: Secondary | ICD-10-CM | POA: Diagnosis not present

## 2022-03-09 ENCOUNTER — Telehealth: Payer: Self-pay

## 2022-03-09 NOTE — Telephone Encounter (Signed)
ILR reached RRT 03/08/22. Called patient to advise. Discussed options to leave in or explant. Would like to leave device in. Advised to call if she has further questions or concerns.  ? ?Marked "I" in [place]. ?Removed from [place]. ?Canceled apts. ?

## 2022-03-11 ENCOUNTER — Encounter (HOSPITAL_BASED_OUTPATIENT_CLINIC_OR_DEPARTMENT_OTHER): Payer: PPO | Attending: General Surgery | Admitting: General Surgery

## 2022-03-11 DIAGNOSIS — I872 Venous insufficiency (chronic) (peripheral): Secondary | ICD-10-CM | POA: Diagnosis not present

## 2022-03-11 DIAGNOSIS — I8393 Asymptomatic varicose veins of bilateral lower extremities: Secondary | ICD-10-CM | POA: Insufficient documentation

## 2022-03-11 DIAGNOSIS — I2510 Atherosclerotic heart disease of native coronary artery without angina pectoris: Secondary | ICD-10-CM | POA: Insufficient documentation

## 2022-03-11 DIAGNOSIS — L97822 Non-pressure chronic ulcer of other part of left lower leg with fat layer exposed: Secondary | ICD-10-CM | POA: Insufficient documentation

## 2022-03-11 DIAGNOSIS — I10 Essential (primary) hypertension: Secondary | ICD-10-CM | POA: Diagnosis not present

## 2022-03-11 DIAGNOSIS — I272 Pulmonary hypertension, unspecified: Secondary | ICD-10-CM | POA: Diagnosis not present

## 2022-03-11 DIAGNOSIS — Q210 Ventricular septal defect: Secondary | ICD-10-CM | POA: Diagnosis not present

## 2022-03-11 DIAGNOSIS — I779 Disorder of arteries and arterioles, unspecified: Secondary | ICD-10-CM | POA: Diagnosis not present

## 2022-03-11 NOTE — Progress Notes (Signed)
[name] ([id number]) ?Visit Report for 03/11/2022 ?Abuse Risk Screen Details ?Patient Name: Date of Service: ?DEA TO [name] 03/11/2022 8:00 A M ?Medical Record Number: [id number] ?Patient Account Number: [id number] ?Date of Birth/Sex: Treating RN: ?January 01, 1941 (81 y.o. F) [name], [name] Claude ?Primary Care Geovannie Vilar: Marton Redwood Other Clinician: ?Referring Branch Pacitti: ?Treating Sharni Negron/Extender: Fredirick Maudlin ?Hilty, Chrissie Noa ?Weeks in Treatment: 0 ?Abuse Risk Screen Items ?Answer ?ABUSE RISK SCREEN: ?Has anyone close to you tried to hurt or harm you recentlyo No ?Do you feel uncomfortable with anyone in your familyo No ?Has anyone forced you do things that you didnt want to doo No ?Electronic Signature(s) ?Signed: 03/11/2022 1:47:36 PM By: Dellie Catholic RN ?Entered By: Dellie Catholic on 03/11/2022 08:16:56 ?-------------------------------------------------------------------------------- ?Activities of Daily Living Details ?Patient Name: Date of Service: ?DEA TO [name] 03/11/2022 8:00 A M ?Medical Record Number: [id number] ?Patient Account Number: [id number] ?Date of Birth/Sex: Treating RN: ?07/17/1941 (81 y.o. F) [name], [name] Claude ?Primary Care Mccall Lomax: Marton Redwood Other Clinician: ?Referring Milda Lindvall: ?Treating Anasia Agro/Extender: Fredirick Maudlin ?Hilty, Chrissie Noa ?Weeks in Treatment: 0 ?Activities of Daily Living Items ?Answer ?Activities of Daily Living (Please select one for each item) ?[place] ?T Medications ?ake Completely Able ?Use T elephone Completely Able ?Care for Appearance Completely Able ?Use T oilet Completely Able ?Bath / Shower Completely Able ?Dress Self Completely Able ?Feed Self Completely Able ?Walk Completely Able ?Get In / Out Bed Completely Able ?Housework Completely Able ?Prepare Meals Completely Able ?Handle Money Completely Able ?Shop for Self Completely Able ?Electronic Signature(s) ?Signed: 03/11/2022 1:47:36 PM By: Dellie Catholic RN ?Entered By:  Dellie Catholic on 03/11/2022 08:17:28 ?-------------------------------------------------------------------------------- ?Education Screening Details ?Patient Name: ?Date of Service: ?DEA TO [name] 03/11/2022 8:00 A M ?Medical Record Number: [id number] ?Patient Account Number: [id number] ?Date of Birth/Sex: ?Treating RN: ?Nov 02, 1941 (81 y.o. F) [name], [name] Claude ?Primary Care Brielyn Bosak: Marton Redwood ?Other Clinician: ?Referring Adrain Nesbit: ?Treating Bracken Moffa/Extender: Fredirick Maudlin ?Hilty, Chrissie Noa ?Weeks in Treatment: 0 ?Primary Learner Assessed: Patient ?Learning Preferences/Education Level/Primary Language ?Learning Preference: Explanation, Demonstration, Printed Material ?Highest Education Level: College or Above ?Preferred Language: English ?Cognitive Barrier ?Language Barrier: No ?Translator Needed: No ?Memory Deficit: No ?Emotional Barrier: No ?Cultural/Religious Beliefs Affecting Medical Care: No ?Physical Barrier ?Impaired Vision: No ?Impaired Hearing: No ?Decreased Hand dexterity: No ?Knowledge/Comprehension ?Knowledge Level: High ?Comprehension Level: High ?Ability to understand written instructions: High ?Ability to understand verbal instructions: High ?Motivation ?Anxiety Level: Calm ?Cooperation: Cooperative ?Education Importance: Acknowledges Need ?Interest in Health Problems: Asks Questions ?Perception: Coherent ?Willingness to Engage in Self-Management High ?Activities: ?Readiness to Engage in Self-Management High ?Activities: ?Electronic Signature(s) ?Signed: 03/11/2022 1:47:36 PM By: Dellie Catholic RN ?Entered By: Dellie Catholic on 03/11/2022 08:18:34 ?-------------------------------------------------------------------------------- ?Fall Risk Assessment Details ?Patient Name: ?Date of Service: ?DEA TO [name] 03/11/2022 8:00 A M ?Medical Record Number: [id number] ?Patient Account Number: [id number] ?Date of Birth/Sex: ?Treating RN: ?1941/08/10 (81 y.o. F) [name], [name] Claude ?Primary Care  Marybell Robards: Marton Redwood ?Other Clinician: ?Referring Lacretia Tindall: ?Treating Shiela Bruns/Extender: Fredirick Maudlin ?Hilty, Chrissie Noa ?Weeks in Treatment: 0 ?Fall Risk Assessment Items ?Have you had 2 or more falls in the last 12 monthso 0 No ?Have you had any fall that resulted in injury in the last 12 monthso 0 No ?FALLS RISK SCREEN ?History of falling - immediate or within 3 months 0 No ?Secondary diagnosis (Do you have 2 or more medical diagnoseso) 0 No ?Ambulatory aid ?None/bed rest/wheelchair/nurse 0 No ?Crutches/cane/walker 0 No ?Furniture 0 No ?Intravenous therapy Access/Saline/Heparin Lock 0 No ?Gait/Transferring ?  Normal/ bed rest/ wheelchair 0 No ?Weak (short steps with or without shuffle, stooped but able to lift head while walking, may seek 0 No ?support from furniture) ?Impaired (short steps with shuffle, may have difficulty arising from chair, head down, impaired 0 No ?balance) ?Mental Status ?Oriented to own ability 0 No ?Electronic Signature(s) ?Signed: 03/11/2022 1:47:36 PM By: Dellie Catholic RN ?Entered By: Dellie Catholic on 03/11/2022 08:19:40 ?-------------------------------------------------------------------------------- ?Foot Assessment Details ?Patient Name: ?Date of Service: ?DEA TO [name] 03/11/2022 8:00 A M ?Medical Record Number: [id number] ?Patient Account Number: [id number] ?Date of Birth/Sex: ?Treating RN: ?1941-01-29 (81 y.o. F) [name], [name] Claude ?Primary Care Rachyl Wuebker: Marton Redwood ?Other Clinician: ?Referring Brantleigh Mifflin: ?Treating Nastacia Raybuck/Extender: Fredirick Maudlin ?Hilty, Chrissie Noa ?Weeks in Treatment: 0 ?Foot Assessment Items ?Site Locations ?+ = Sensation present, - = Sensation absent, C = Callus, U = Ulcer ?R = Redness, W = Warmth, M = Maceration, PU = Pre-ulcerative lesion ?F = Fissure, S = Swelling, D = Dryness ?Assessment ?Right: Left: ?Other Deformity: No No ?Prior Foot Ulcer: No No ?Prior Amputation: No No ?Charcot Joint: No No ?Ambulatory Status: ?Gait: ?Electronic  Signature(s) ?Signed: 03/11/2022 1:47:36 PM By: Dellie Catholic RN ?Entered By: Dellie Catholic on 03/11/2022 08:27:38 ?-------------------------------------------------------------------------------- ?Nutrition Risk Screening Details ?Patient Name: ?Date of Service: ?DEA TO [name] 03/11/2022 8:00 A M ?Medical Record Number: [id number] ?Patient Account Number: [id number] ?Date of Birth/Sex: ?Treating RN: ?08/26/1941 (81 y.o. F) [name], [name] Claude ?Primary Care El Pile: Marton Redwood ?Other Clinician: ?Referring Ellason Segar: ?Treating Rashawna Scoles/Extender: Fredirick Maudlin ?Hilty, Chrissie Noa ?Weeks in Treatment: 0 ?Height (in): 60 ?Weight (lbs): 102 ?Body Mass Index (BMI): 19.9 ?Nutrition Risk Screening Items ?Score Screening ?NUTRITION RISK SCREEN: ?I have an illness or condition that made me change the kind and/or amount of food I eat 0 No ?I eat fewer than two meals per day 0 No ?I eat few fruits and vegetables, or milk products 0 No ?I have three or more drinks of beer, liquor or wine almost every day 0 No ?I have tooth or mouth problems that make it hard for me to eat 0 No ?I don't always have enough money to buy the food I need 0 No ?I eat alone most of the time 0 No ?I take three or more different prescribed or over-the-counter drugs a day 0 No ?Without wanting to, I have lost or gained 10 pounds in the last six months 0 No ?I am not always physically able to shop, cook and/or feed myself 0 No ?Nutrition Protocols ?Good Risk Protocol 0 No interventions needed ?Moderate Risk Protocol ?High Risk Proctocol ?Risk Level: Good Risk ?Score: 0 ?Electronic Signature(s) ?Signed: 03/11/2022 1:47:36 PM By: Dellie Catholic RN ?Entered By: Dellie Catholic on 03/11/2022 08:20:10 ?

## 2022-03-11 NOTE — Progress Notes (Addendum)
[name] ([id number]) ?Visit Report for 03/11/2022 ?Chief Complaint Document Details ?Patient Name: Date of Service: ?[name] K. 03/11/2022 8:00 A M ?Medical Record Number: [id number] ?Patient Account Number: [id number] ?Date of Birth/Sex: Treating RN: ?02/12/1941 (81 y.o. F) [name], [name] Claude ?Primary Care Provider: Marton [name] Other Clinician: ?Referring Provider: ?Treating Provider/Extender: [name] ?[name], [name] Noa ?Weeks in Treatment: 0 ?Information Obtained from: Patient ?Chief Complaint ?Patient seen for complaints of Non-Healing Wound. ?Electronic Signature(s) ?Signed: 03/11/2022 9:13:05 AM By: [name] Maudlin MD FACS ?Entered By: [name] on 03/11/2022 09:13:05 ?-------------------------------------------------------------------------------- ?Debridement Details ?Patient Name: Date of Service: ?[name] K. 03/11/2022 8:00 A M ?Medical Record Number: [id number] ?Patient Account Number: [id number] ?Date of Birth/Sex: Treating RN: ?1941/10/30 (81 y.o. F) [name], [name] Claude ?Primary Care Provider: Marton [name] Other Clinician: ?Referring Provider: ?Treating Provider/Extender: [name] ?[name], [name] Noa ?Weeks in Treatment: 0 ?Debridement Performed for Assessment: Wound #1 Left,Medial Lower Leg ?Performed By: Physician [name] Maudlin, MD ?Debridement Type: Debridement ?Severity of Tissue Pre Debridement: Fat layer exposed ?Level of Consciousness (Pre-procedure): Awake and Alert ?Pre-procedure Verification/Time Out Yes - 08:42 ?Taken: ?Start Time: 08:42 ?Pain Control: Lidocaine 5% topical ointment ?T Area Debrided (L x W): ?otal 1.3 (cm) x 1.4 (cm) = 1.82 (cm?) ?Tissue and other material debrided: Non-Viable, Eschar, [place], [place] ?Level: Non-Viable Tissue ?Debridement Description: Selective/Open Wound ?Instrument: Curette ?Bleeding: Minimum ?Hemostasis Achieved: Pressure ?End Time: 08:44 ?Procedural Pain: 0 ?Post Procedural Pain: 0 ?Response to Treatment: Procedure was  tolerated well ?Level of Consciousness (Post- Awake and Alert ?procedure): ?Post Debridement Measurements of Total Wound ?Length: (cm) 1.3 ?Width: (cm) 1.4 ?Depth: (cm) 0.2 ?Volume: (cm?) 0.286 ?Character of Wound/Ulcer Post Debridement: Improved ?Severity of Tissue Post Debridement: Fat layer exposed ?Post Procedure Diagnosis ?Same as Pre-procedure ?Electronic Signature(s) ?Signed: 03/11/2022 12:24:03 PM By: [name] Maudlin MD FACS ?Signed: 03/11/2022 1:47:36 PM By: [name] Catholic RN ?Entered By: [name] on 03/11/2022 08:44:48 ?-------------------------------------------------------------------------------- ?HPI Details ?Patient Name: Date of Service: ?[name] K. 03/11/2022 8:00 A M ?Medical Record Number: [id number] ?Patient Account Number: [id number] ?Date of Birth/Sex: Treating RN: ?19-Aug-1941 (81 y.o. F) [name], [name] Claude ?Primary Care Provider: Marton [name] Other Clinician: ?Referring Provider: ?Treating Provider/Extender: [name] ?[name], [name] Noa ?Weeks in Treatment: 0 ?History of Present Illness ?HPI Description: ADMISSION ?03/11/2022 ?This is an 81 year old woman with a past medical history notable for carotid artery disease, pulmonary hypertension, varicose veins, multiple orthopedic ?surgical procedures, and a perimembranous VSD who presents today with a nonhealing wound on her left anterior tibial surface. It occurred about 2 months ago ?when a vacuum cleaner implement fell from the closet and struck her in the leg. This resulted in a hematoma. Due to her multiple orthopedic procedures, she ?initially was seen at [facility]. They performed an x-ray that was negative and apparently basically told her that it would take some time to resolve. I am ?not entirely sure how the hematoma ended up being evacuated but she says that her primary care doctor put her on doxycycline and has had her applying ?mupirocin with Prisma silver collagen to the site. She says in the past 7 days, since  that treatment was initiated, she has noted significant improvement. It ?does not seem to have much drainage nor is there any odor. The periwound skin is in good condition. ?Electronic Signature(s) ?Signed: 03/11/2022 9:16:03 AM By: [name] Maudlin MD FACS ?Entered By: [name] on 03/11/2022 09:16:02 ?-------------------------------------------------------------------------------- ?Physical Exam Details ?Patient Name: Date of Service: ?DEA TO N, ELINO [name] K.  03/11/2022 8:00 A M ?Medical Record Number: [id number] ?Patient Account Number: [id number] ?Date of Birth/Sex: Treating RN: ?07-13-1941 (81 y.o. F) [name], [name] Claude ?Primary Care Provider: Marton [name] Other Clinician: ?Referring Provider: ?Treating Provider/Extender: [name] ?[name], [name] Noa ?Weeks in Treatment: 0 ?Constitutional ?. . . . No acute distress. ?Respiratory ?Normal work of breathing on room air.Marland Kitchen ?Cardiovascular ?Easily palpable 2+ dorsalis pedis pulses bilaterally.Marland Kitchen Extensive bilateral varicosities.Marland Kitchen ?Notes ?03/11/2022: On the anterior tibial surface of her left lower extremity, there is a circular wound with the fat layer exposed. The surface is covered with some ?nonviable fat and old clot, as well as some slough. The periwound skin is intact. There is a bit of undermining circumferentially. ?Electronic Signature(s) ?Signed: 03/11/2022 9:24:37 AM By: [name] Maudlin MD FACS ?Entered By: [name] on 03/11/2022 09:24:37 ?-------------------------------------------------------------------------------- ?Physician Orders Details ?Patient Name: ?Date of Service: ?[name] K. 03/11/2022 8:00 A M ?Medical Record Number: [id number] ?Patient Account Number: [id number] ?Date of Birth/Sex: ?Treating RN: ?Dec 05, 1940 (81 y.o. 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 - Dr Celine Ahr Room 3 ?Return A ?Bathing/ Shower/ Hygiene ?May shower with protection but do not get wound dressing(s) wet. - Please wear Leg protection for left leg (Cast protector, you can purchase ?this from [facility], [facility] etc. $ 18-$20) ?Wound Treatment ?Wound #1 - Lower Leg Wound Laterality: Left, Medial ?Cleanser: Soap and Water ?Discharge Instructions: May shower and wash wound with dial antibacterial soap and water prior to dressing change. ?Cleanser: Wound Cleanser ?Discharge Instructions: Cleanse the wound with wound cleanser prior to applying a clean dressing using gauze sponges, not tissue or cotton balls. ?Topical: Mupirocin Ointment ?Discharge Instructions: Apply Mupirocin (Bactroban) as instructed ?Prim Dressing: Hydrofera Blue Classic Foam, 2x2 in ?ary ?Discharge Instructions: Moisten with saline prior to applying to wound bed ?Secondary Dressing: Zetuvit Plus 4x8 in ?Discharge Instructions: Apply over primary dressing as directed. ?Compression Wrap: ThreePress (3 layer compression wrap) ?Discharge Instructions: Apply three layer compression as directed. ?Electronic Signature(s) ?Signed: 03/11/2022 9:24:50 AM By: [name] Maudlin MD FACS ?Entered By: [name] on 03/11/2022 09:24:50 ?-------------------------------------------------------------------------------- ?Problem List Details ?Patient Name: ?Date of Service: ?[name] K. 03/11/2022 8:00 A M ?Medical Record Number: [id number] ?Patient Account Number: [id number] ?Date of Birth/Sex: ?Treating RN: ?[birth year] (81 y.o. F) [name], [name] Claude ?Primary Care  Provider: Marton [name] ?Other Clinician: ?Referring Provider: ?Treating Provider/Extender: [name] ?[name], [name] Noa ?Weeks in Treatment: 0 ?Active Problems ?ICD-10 ?Encounter ?Code Description Active Date MD

## 2022-03-11 NOTE — Progress Notes (Signed)
Donne Hazel ([id number]) ?Visit Report for 03/11/2022 ?Allergy List Details ?Patient Name: Date of Service: ?DEA TO [name] 03/11/2022 8:00 A M ?Medical Record Number: [id number] ?Patient Account Number: [id number] ?Date of Birth/Sex: Treating RN: ?1941-05-27 (81 y.o. F) Scotton, Mechele Claude ?Primary Care Josalin Carneiro: Marton Redwood Other Clinician: ?Referring Taniqua Issa: ?Treating Loucinda Croy/Extender: Fredirick Maudlin ?Hilty, Chrissie Noa ?Weeks in Treatment: 0 ?Allergies ?Active Allergies ?Bactrim ?Severity: Moderate ?Allergy Notes ?Electronic Signature(s) ?Signed: 03/11/2022 1:47:36 PM By: Dellie Catholic RN ?Entered By: Dellie Catholic on 03/11/2022 08:10:32 ?-------------------------------------------------------------------------------- ?Arrival Information Details ?Patient Name: Date of Service: ?DEA TO [name] 03/11/2022 8:00 A M ?Medical Record Number: [id number] ?Patient Account Number: [id number] ?Date of Birth/Sex: Treating RN: ?Mar 03, 1941 (81 y.o. F) Scotton, Mechele Claude ?Primary Care Alishba Naples: Marton Redwood Other Clinician: ?Referring Naleigha Raimondi: ?Treating Caspian Deleonardis/Extender: Fredirick Maudlin ?Hilty, Chrissie Noa ?Weeks in Treatment: 0 ?Visit Information ?Patient Arrived: Ambulatory ?Arrival Time: 08:08 ?Accompanied By: self ?Transfer Assistance: None ?Patient Identification Verified: Yes ?Electronic Signature(s) ?Signed: 03/11/2022 1:47:36 PM By: Dellie Catholic RN ?Entered By: Dellie Catholic on 03/11/2022 08:09:18 ?-------------------------------------------------------------------------------- ?Clinic Level of Care Assessment Details ?Patient Name: Date of Service: ?DEA TO [name] 03/11/2022 8:00 A M ?Medical Record Number: [id number] ?Patient Account Number: [id number] ?Date of Birth/Sex: Treating RN: ?15-Apr-1941 (81 y.o. F) Scotton, Mechele Claude ?Primary Care Amar Sippel: Marton Redwood Other Clinician: ?Referring Nichele Slawson: ?Treating Reginna Sermeno/Extender: Fredirick Maudlin ?Hilty, Chrissie Noa ?Weeks in Treatment: 0 ?Clinic Level of Care  Assessment Items ?TOOL 1 Quantity Score ?X- 1 0 ?Use when EandM and Procedure is performed on INITIAL visit ?ASSESSMENTS - Nursing Assessment / Reassessment ?X- 1 20 ?General Physical Exam (combine w/ comprehensive assessment (listed just below) when performed on new pt. evals) ?X- 1 25 ?Comprehensive Assessment (HX, ROS, Risk Assessments, Wounds Hx, etc.) ?ASSESSMENTS - Wound and Skin Assessment / Reassessment ?'[]'$  - 0 ?Dermatologic / Skin Assessment (not related to wound area) ?ASSESSMENTS - Ostomy and/or Continence Assessment and Care ?'[]'$  - 0 ?Incontinence Assessment and Management ?'[]'$  - 0 ?Ostomy Care Assessment and Management (repouching, etc.) ?PROCESS - Coordination of Care ?X - Simple Patient / Family Education for ongoing care 1 15 ?'[]'$  - 0 ?Complex (extensive) Patient / Family Education for ongoing care ?X- 1 10 ?Staff obtains Consents, Records, T Results / Process Orders ?est ?X- 1 10 ?Staff telephones HHA, Nursing Homes / Clarify orders / etc ?'[]'$  - 0 ?Routine Transfer to another Facility (non-emergent condition) ?'[]'$  - 0 ?Routine Hospital Admission (non-emergent condition) ?X- 1 15 ?New Admissions / Biomedical engineer / Ordering NPWT Apligraf, etc. ?, ?'[]'$  - 0 ?Emergency Hospital Admission (emergent condition) ?PROCESS - Special Needs ?'[]'$  - 0 ?Pediatric / Minor Patient Management ?'[]'$  - 0 ?Isolation Patient Management ?'[]'$  - 0 ?Hearing / Language / Visual special needs ?'[]'$  - 0 ?Assessment of Community assistance (transportation, D/C planning, etc.) ?'[]'$  - 0 ?Additional assistance / Altered mentation ?'[]'$  - 0 ?Support Surface(s) Assessment (bed, cushion, seat, etc.) ?INTERVENTIONS - Miscellaneous ?'[]'$  - 0 ?External ear exam ?'[]'$  - 0 ?Patient Transfer (multiple staff / Civil Service fast streamer / Similar devices) ?'[]'$  - 0 ?Simple Staple / Suture removal (25 or less) ?'[]'$  - 0 ?Complex Staple / Suture removal (26 or more) ?'[]'$  - 0 ?Hypo/Hyperglycemic Management (do not check if billed separately) ?X- 1 15 ?Ankle / Brachial Index  (ABI) - do not check if billed separately ?Has the patient been seen at the hospital within the last three years: Yes ?Total Score: 110 ?Level Of Care: New/Established - Level 3 ?Electronic Signature(s) ?Signed: 03/11/2022 1:47:36 PM  By: Dellie Catholic RN ?Entered By: Dellie Catholic on 03/11/2022 13:45:44 ?-------------------------------------------------------------------------------- ?Encounter Discharge Information Details ?Patient Name: ?Date of Service: ?DEA TO [name] 03/11/2022 8:00 A M ?Medical Record Number: [id number] ?Patient Account Number: [id number] ?Date of Birth/Sex: ?Treating RN: ?[birth year] (81 y.o. F) Scotton, Mechele Claude ?Primary Care Crosby Bevan: Marton Redwood ?Other Clinician: ?Referring Bobbie Virden: ?Treating Rand Boller/Extender: Fredirick Maudlin ?Hilty, Chrissie Noa ?Weeks in Treatment: 0 ?Encounter Discharge Information Items Post Procedure Vitals ?Discharge Condition: Stable ?Temperature (F): 98 ?Ambulatory Status: Ambulatory ?Pulse (bpm): 69 ?Discharge Destination: Home ?Respiratory Rate (breaths/min): 16 ?Transportation: Private Auto ?Blood Pressure (mmHg): 134/75 ?Accompanied By: self ?Schedule Follow-up Appointment: Yes ?Clinical Summary of Care: Patient Declined ?Electronic Signature(s) ?Signed: 03/11/2022 1:47:36 PM By: Dellie Catholic RN ?Entered By: Dellie Catholic on 03/11/2022 13:47:07 ?-------------------------------------------------------------------------------- ?Lower Extremity Assessment Details ?Patient Name: ?Date of Service: ?DEA TO [name] 03/11/2022 8:00 A M ?Medical Record Number: [id number] ?Patient Account Number: [id number] ?Date of Birth/Sex: ?Treating RN: ?October 19, 1941 (81 y.o. F) Scotton, Mechele Claude ?Primary Care Yasmine Kilbourne: Marton Redwood ?Other Clinician: ?Referring Athalia Setterlund: ?Treating Allye Hoyos/Extender: Fredirick Maudlin ?Hilty, Chrissie Noa ?Weeks in Treatment: 0 ?Edema Assessment ?Assessed: [Left: No] [Right: No] ?Edema: [Left: N] [Right: o] ?Calf ?Left: Right: ?Point of Measurement: 29  cm From Medial Instep 33 cm ?Ankle ?Left: Right: ?Point of Measurement: 8 cm From Medial Instep 19 cm ?Knee To Floor ?Left: Right: ?From Medial Instep 40 cm 40 cm ?Vascular Assessment ?Pulses: ?Dorsalis Pedis ?Palpable: [Left:Yes] ?Blood Pressure: ?Brachial: [Left:134] ?Ankle: ?[Left:Dorsalis Pedis: 146 1.09] ?Electronic Signature(s) ?Signed: 03/11/2022 1:47:36 PM By: Dellie Catholic RN ?Entered By: Dellie Catholic on 03/11/2022 08:57:52 ?-------------------------------------------------------------------------------- ?Multi Wound Chart Details ?Patient Name: ?Date of Service: ?DEA TO [name] 03/11/2022 8:00 A M ?Medical Record Number: [id number] ?Patient Account Number: [id number] ?Date of Birth/Sex: ?Treating RN: ?16-Mar-1941 (81 y.o. F) Scotton, Mechele Claude ?Primary Care Tashonna Descoteaux: Marton Redwood ?Other Clinician: ?Referring Dillinger Aston: ?Treating Hubert Derstine/Extender: Fredirick Maudlin ?Hilty, Chrissie Noa ?Weeks in Treatment: 0 ?Vital Signs ?Height(in): 60 ?Pulse(bpm): 69 ?Weight(lbs): 102 ?Blood Pressure(mmHg): 134/75 ?Body Mass Index(BMI): 19.9 ?Temperature(??F): 98 ?Respiratory Rate(breaths/min): 16 ?Photos: [N/A:N/A] ?Left, Medial Lower Leg N/A N/A ?Wound Location: ?Trauma N/A N/A ?Wounding Event: ?Venous Leg Ulcer N/A N/A ?Primary Etiology: ?Cataracts, Hypertension N/A N/A ?Comorbid History: ?01/07/2022 N/A N/A ?Date Acquired: ?0 N/A N/A ?Weeks of Treatment: ?Open N/A N/A ?Wound Status: ?No N/A N/A ?Wound Recurrence: ?1.3x1.4x0.2 N/A N/A ?Measurements L x W x D (cm) ?1.429 N/A N/A ?A (cm?) : ?rea ?0.286 N/A N/A ?Volume (cm?) : ?0.00% N/A N/A ?% Reduction in A rea: ?0.00% N/A N/A ?% Reduction in Volume: ?Full Thickness Without Exposed N/A N/A ?Classification: ?Support Structures ?Medium N/A N/A ?Exudate A mount: ?Serosanguineous N/A N/A ?Exudate Type: ?red, brown N/A N/A ?Exudate Color: ?Medium (34-66%) N/A N/A ?Granulation A mount: ?Red N/A N/A ?Granulation Quality: ?Medium (34-66%) N/A N/A ?Necrotic A mount: ?[place]char,  [place] N/A N/A ?Necrotic Tissue: ?Fat Layer (Subcutaneous Tissue): Yes N/A N/A ?Exposed Structures: ?Fascia: No ?Tendon: No ?Muscle: No ?Joint: No ?Bone: No ?Small (1-33%) N/A N/A ?Epithelializati

## 2022-03-14 ENCOUNTER — Encounter (HOSPITAL_BASED_OUTPATIENT_CLINIC_OR_DEPARTMENT_OTHER): Payer: PPO | Admitting: General Surgery

## 2022-03-16 NOTE — Progress Notes (Signed)
Carelink Summary Report / Loop Recorder 

## 2022-03-18 ENCOUNTER — Encounter (HOSPITAL_BASED_OUTPATIENT_CLINIC_OR_DEPARTMENT_OTHER): Payer: PPO | Attending: General Surgery | Admitting: General Surgery

## 2022-03-18 DIAGNOSIS — L97822 Non-pressure chronic ulcer of other part of left lower leg with fat layer exposed: Secondary | ICD-10-CM | POA: Diagnosis not present

## 2022-03-18 DIAGNOSIS — I8393 Asymptomatic varicose veins of bilateral lower extremities: Secondary | ICD-10-CM | POA: Diagnosis not present

## 2022-03-18 DIAGNOSIS — I10 Essential (primary) hypertension: Secondary | ICD-10-CM | POA: Diagnosis not present

## 2022-03-18 DIAGNOSIS — I272 Pulmonary hypertension, unspecified: Secondary | ICD-10-CM | POA: Diagnosis not present

## 2022-03-18 DIAGNOSIS — I872 Venous insufficiency (chronic) (peripheral): Secondary | ICD-10-CM | POA: Diagnosis not present

## 2022-03-18 DIAGNOSIS — I779 Disorder of arteries and arterioles, unspecified: Secondary | ICD-10-CM | POA: Diagnosis not present

## 2022-03-18 DIAGNOSIS — Q210 Ventricular septal defect: Secondary | ICD-10-CM | POA: Diagnosis not present

## 2022-03-18 DIAGNOSIS — I2510 Atherosclerotic heart disease of native coronary artery without angina pectoris: Secondary | ICD-10-CM | POA: Insufficient documentation

## 2022-03-18 NOTE — Progress Notes (Signed)
[name] ([id number]) ?Visit Report for 03/18/2022 ?Arrival Information Details ?Patient Name: Date of Service: ?DEA TO [name] 03/18/2022 9:30 A M ?Medical Record Number: [id number] ?Patient Account Number: [id number] ?Date of Birth/Sex: Treating RN: ?Mar 14, 1941 (81 y.o. [name] ?Primary Care [name]: [name] Other Clinician: ?Referring [name]: ?Treating [name]/Extender: [name] ?[name] ?Weeks in Treatment: 1 ?Visit Information History Since Last Visit ?Added or deleted any medications: No ?Patient Arrived: Ambulatory ?Any new allergies or adverse reactions: No ?Arrival Time: 09:44 ?Had a fall or experienced change in No ?Accompanied By: self ?activities of daily living that may affect ?Transfer Assistance: None ?risk of falls: ?Patient Identification Verified: Yes ?Signs or symptoms of abuse/neglect since last visito No ?Secondary Verification Process Completed: Yes ?Hospitalized since last visit: No ?Patient Requires Transmission-Based Precautions: No ?Implantable device outside of the clinic excluding No ?Patient Has Alerts: Yes ?cellular tissue based products placed in the center ?Patient Alerts: Patient on Blood Thinner since last visit: ?Has Dressing in Place as Prescribed: Yes ?Has Compression in Place as Prescribed: Yes ?Pain Present Now: No ?Electronic Signature(s) ?Signed: 03/18/2022 2:04:38 PM By: Adline Peals ?Entered By: Adline Peals on 03/18/2022 09:45:23 ?-------------------------------------------------------------------------------- ?Compression Therapy Details ?Patient Name: Date of Service: ?DEA TO [name] 03/18/2022 9:30 A M ?Medical Record Number: [id number] ?Patient Account Number: [id number] ?Date of Birth/Sex: Treating RN: ?03-04-1941 (81 y.o. F) [name], [name] Claude ?Primary Care [name]: [name] Other Clinician: ?Referring [name]: ?Treating [name]/Extender: [name] ?[name] ?Weeks in Treatment: 1 ?Compression  Therapy Performed for Wound Assessment: Wound #1 Left,Medial Lower Leg ?Performed By: Clinician [name] Catholic, RN ?Compression Type: Three Layer ?Post Procedure Diagnosis ?Same as Pre-procedure ?Electronic Signature(s) ?Signed: 03/18/2022 3:01:56 PM By: [name] Catholic RN ?Entered By: [name] on 03/18/2022 10:13:15 ?-------------------------------------------------------------------------------- ?Encounter Discharge Information Details ?Patient Name: ?Date of Service: ?DEA TO [name] 03/18/2022 9:30 A M ?Medical Record Number: [id number] ?Patient Account Number: [id number] ?Date of Birth/Sex: ?Treating RN: ?10-Nov-1941 (81 y.o. F) [name], [name] Claude ?Primary Care [name]: [name] ?Other Clinician: ?Referring Chauntae Hults: ?Treating [name]/Extender: [name] ?[name] ?Weeks in Treatment: 1 ?Encounter Discharge Information Items Post Procedure Vitals ?Discharge Condition: Stable ?Temperature (F): 97.8 ?Ambulatory Status: Ambulatory ?[name] (bpm): 69 ?Discharge Destination: Home ?Respiratory Rate (breaths/min): 16 ?Transportation: Private Auto ?Blood Pressure (mmHg): 155/72 ?Accompanied By: self ?Schedule Follow-up Appointment: Yes ?Clinical Summary of Care: Patient Declined ?Electronic Signature(s) ?Signed: 03/18/2022 3:01:56 PM By: [name] Catholic RN ?Entered By: [name] on 03/18/2022 10:18:47 ?-------------------------------------------------------------------------------- ?Lower Extremity Assessment Details ?Patient Name: ?Date of Service: ?DEA TO [name] 03/18/2022 9:30 A M ?Medical Record Number: [id number] ?Patient Account Number: [id number] ?Date of Birth/Sex: ?Treating RN: ?09-04-1941 (81 y.o. [name] ?Primary Care [name]: [name] ?Other Clinician: ?Referring [name]: ?Treating [name]/Extender: [name] ?[name] ?Weeks in Treatment: 1 ?Edema Assessment ?Assessed: [Left: No] [Right: No] ?Edema: [Left: N] [Right: o] ?Calf ?Left: Right: ?Point  of Measurement: 29 cm From Medial Instep 30.2 cm ?Ankle ?Left: Right: ?Point of Measurement: 8 cm From Medial Instep 19.3 cm ?Vascular Assessment ?Pulses: ?Dorsalis Pedis ?Palpable: [Left:Yes] ?Electronic Signature(s) ?Signed: 03/18/2022 2:04:38 PM By: Adline Peals ?Entered By: Adline Peals on 03/18/2022 09:49:12 ?-------------------------------------------------------------------------------- ?Multi Wound Chart Details ?Patient Name: ?Date of Service: ?DEA TO [name] 03/18/2022 9:30 A M ?Medical Record Number: [id number] ?Patient Account Number: [id number] ?Date of Birth/Sex: ?Treating RN: ?10-09-1941 (81 y.o. F) [name], [name] Claude ?Primary Care [name]: [name] ?Other Clinician: ?Referring [name]: ?Treating [name]/Extender: [name] ?Brigitte [name],  [name] ?Weeks in Treatment: 1 ?Vital Signs ?Height(in): 60 ?[name](bpm): 69 ?Weight(lbs): 102 ?Blood Pressure(mmHg): 155/72 ?Body Mass Index(BMI): 19.9 ?Temperature(??F): 97.8 ?Respiratory Rate(breaths/min): 16 ?Photos: [N/A:N/A] ?Left, Medial Lower Leg N/A N/A ?Wound Location: ?Trauma N/A N/A ?Wounding Event: ?Venous Leg Ulcer N/A N/A ?Primary Etiology: ?Cataracts, Hypertension N/A N/A ?Comorbid History: ?01/07/2022 N/A N/A ?Date Acquired: ?1 N/A N/A ?Weeks of Treatment: ?Open N/A N/A ?Wound Status: ?No N/A N/A ?Wound Recurrence: ?1.3x1.1x0.2 N/A N/A ?Measurements L x W x D (cm) ?1.123 N/A N/A ?A (cm?) : ?rea ?0.225 N/A N/A ?Volume (cm?) : ?21.40% N/A N/A ?% Reduction in A rea: ?21.30% N/A N/A ?% Reduction in Volume: ?Full Thickness Without Exposed N/A N/A ?Classification: ?Support Structures ?Medium N/A N/A ?Exudate A mount: ?Serosanguineous N/A N/A ?Exudate Type: ?red, brown N/A N/A ?Exudate Color: ?Small (1-33%) N/A N/A ?Granulation A mount: ?Red N/A N/A ?Granulation Quality: ?Large (67-100%) N/A N/A ?Necrotic A mount: ?Eschar, Adherent Slough N/A N/A ?Necrotic Tissue: ?Fat Layer (Subcutaneous Tissue): Yes N/A N/A ?Exposed Structures: ?Fascia:  No ?Tendon: No ?Muscle: No ?Joint: No ?Bone: No ?Small (1-33%) N/A N/A ?Epithelialization: ?Debridement - Excisional N/A N/A ?Debridement: ?Pre-procedure Verification/Time Out 09:56 N/A N/A ?Taken: ?Lidocaine 4% Topical Solution N/A N/A ?Pain Control: ?Subcutaneous, Slough N/A N/A ?Tissue Debrided: ?Skin/Subcutaneous Tissue N/A N/A ?Level: ?1.43 N/A N/A ?Debridement A (sq cm): ?rea ?Curette N/A N/A ?Instrument: ?Minimum N/A N/A ?Bleeding: ?Pressure N/A N/A ?Hemostasis A chieved: ?0 N/A N/A ?Procedural Pain: ?0 N/A N/A ?Post Procedural Pain: ?Procedure was tolerated well N/A N/A ?Debridement Treatment Response: ?1.3x1.1x0.2 N/A N/A ?Post Debridement Measurements L x ?W x D (cm) ?0.225 N/A N/A ?Post Debridement Volume: (cm?) ?Compression Therapy N/A N/A ?Procedures Performed: ?Debridement ?Treatment Notes ?Wound #1 (Lower Leg) Wound Laterality: Left, Medial ?Cleanser ?Soap and Water ?Discharge Instruction: May shower and wash wound with dial antibacterial soap and water prior to dressing change. ?Wound Cleanser ?Discharge Instruction: Cleanse the wound with wound cleanser prior to applying a clean dressing using gauze sponges, not tissue or cotton balls. ?Peri-Wound Care ?Topical ?Mupirocin Ointment ?Discharge Instruction: Apply Mupirocin (Bactroban) as instructed ?Primary Dressing ?Hydrofera Blue Classic Foam, 2x2 in ?Discharge Instruction: Moisten with saline prior to applying to wound bed ?Secondary Dressing ?Zetuvit Plus 4x8 in ?Discharge Instruction: Apply over primary dressing as directed. ?Secured With ?Compression Wrap ?ThreePress (3 layer compression wrap) ?Discharge Instruction: Apply three layer compression as directed. ?Compression Stockings ?Add-Ons ?Electronic Signature(s) ?Signed: 03/18/2022 10:28:42 AM By: [name] Maudlin MD FACS ?Signed: 03/18/2022 3:01:56 PM By: [name] Catholic RN ?Entered By: [name] on 03/18/2022  10:28:42 ?-------------------------------------------------------------------------------- ?Multi-Disciplinary Care Plan Details ?Patient Name: ?Date of Service: ?DEA TO [name] 03/18/2022 9:30 A M ?Medical Record Number: [id number] ?Patient Account Number: [id number] ?Date of Birth/S

## 2022-03-18 NOTE — Progress Notes (Signed)
[name] ([id number]) ?Visit Report for 03/18/2022 ?Chief Complaint Document Details ?Patient Name: Date of Service: ?DEA TO [name] 03/18/2022 9:30 A M ?Medical Record Number: [id number] ?Patient Account Number: [id number] ?Date of Birth/Sex: Treating RN: ?1941-01-24 (81 y.o. F) Scotton, Mechele Claude ?Primary Care Provider: Marton Redwood Other Clinician: ?Referring Provider: ?Treating Provider/Extender: Fredirick Maudlin ?Marton Redwood ?Weeks in Treatment: 1 ?Information Obtained from: Patient ?Chief Complaint ?Patient seen for complaints of Non-Healing Wound. ?Electronic Signature(s) ?Signed: 03/18/2022 10:29:23 AM By: Fredirick Maudlin MD FACS ?Entered By: Fredirick Maudlin on 03/18/2022 10:29:23 ?-------------------------------------------------------------------------------- ?Debridement Details ?Patient Name: Date of Service: ?DEA TO [name] 03/18/2022 9:30 A M ?Medical Record Number: [id number] ?Patient Account Number: [id number] ?Date of Birth/Sex: Treating RN: ?1941-06-12 (81 y.o. [name] ?Primary Care Provider: Marton Redwood Other Clinician: ?Referring Provider: ?Treating Provider/Extender: Fredirick Maudlin ?Marton Redwood ?Weeks in Treatment: 1 ?Debridement Performed for Assessment: Wound #1 Left,Medial Lower Leg ?Performed By: Physician Fredirick Maudlin, MD ?Debridement Type: Debridement ?Severity of Tissue Pre Debridement: Fat layer exposed ?Level of Consciousness (Pre-procedure): Awake and Alert ?Pre-procedure Verification/Time Out Yes - 09:56 ?Taken: ?Start Time: 09:56 ?Pain Control: Lidocaine 4% T opical Solution ?T Area Debrided (L x W): ?otal 1.3 (cm) x 1.1 (cm) = 1.43 (cm?) ?Tissue and other material debrided: Viable, Non-Viable, Slough, Subcutaneous, Slough ?Level: Skin/Subcutaneous Tissue ?Debridement Description: Excisional ?Instrument: Curette ?Bleeding: Minimum ?Hemostasis Achieved: Pressure ?Procedural Pain: 0 ?Post Procedural Pain: 0 ?Response to Treatment: Procedure was tolerated  well ?Level of Consciousness (Post- Awake and Alert ?procedure): ?Post Debridement Measurements of Total Wound ?Length: (cm) 1.3 ?Width: (cm) 1.1 ?Depth: (cm) 0.2 ?Volume: (cm?) 0.225 ?Character of Wound/Ulcer Post Debridement: Improved ?Severity of Tissue Post Debridement: Fat layer exposed ?Post Procedure Diagnosis ?Same as Pre-procedure ?Electronic Signature(s) ?Signed: 03/18/2022 12:24:54 PM By: Fredirick Maudlin MD FACS ?Signed: 03/18/2022 2:04:38 PM By: Adline Peals ?Entered By: Adline Peals on 03/18/2022 09:57:43 ?-------------------------------------------------------------------------------- ?HPI Details ?Patient Name: Date of Service: ?DEA TO [name] 03/18/2022 9:30 A M ?Medical Record Number: [id number] ?Patient Account Number: [id number] ?Date of Birth/Sex: Treating RN: ?Jun 27, 1941 (81 y.o. F) Scotton, Mechele Claude ?Primary Care Provider: Marton Redwood Other Clinician: ?Referring Provider: ?Treating Provider/Extender: Fredirick Maudlin ?Marton Redwood ?Weeks in Treatment: 1 ?History of Present Illness ?HPI Description: ADMISSION ?03/11/2022 ?This is an 81 year old woman with a past medical history notable for carotid artery disease, pulmonary hypertension, varicose veins, multiple orthopedic ?surgical procedures, and a perimembranous VSD who presents today with a nonhealing wound on her left anterior tibial surface. It occurred about 2 months ago ?when a vacuum cleaner implement fell from the closet and struck her in the leg. This resulted in a hematoma. Due to her multiple orthopedic procedures, she ?initially was seen at [facility]. They performed an x-ray that was negative and apparently basically told her that it would take some time to resolve. I am ?not entirely sure how the hematoma ended up being evacuated but she says that her primary care doctor put her on doxycycline and has had her applying ?mupirocin with Prisma silver collagen to the site. She says in the past 7 days, since that  treatment was initiated, she has noted significant improvement. It ?does not seem to have much drainage nor is there any odor. The periwound skin is in good condition. ?03/18/2022: The wound is smaller today. There is just a bit of slough accumulation on the surface. We have been using mupirocin and Hydrofera Blue. ?Electronic Signature(s) ?Signed: 03/18/2022 10:29:50 AM By: Fredirick Maudlin MD FACS ?  Entered By: Fredirick Maudlin on 03/18/2022 10:29:49 ?-------------------------------------------------------------------------------- ?Physical Exam Details ?Patient Name: Date of Service: ?DEA TO [name] 03/18/2022 9:30 A M ?Medical Record Number: [id number] ?Patient Account Number: [id number] ?Date of Birth/Sex: Treating RN: ?1940-11-27 (81 y.o. F) Scotton, Mechele Claude ?Primary Care Provider: Marton Redwood Other Clinician: ?Referring Provider: ?Treating Provider/Extender: Fredirick Maudlin ?Marton Redwood ?Weeks in Treatment: 1 ?Constitutional ?Slightly hypertensive. . . . No acute distress. ?Respiratory ?Normal work of breathing on room air. ?Notes ?03/18/2022: The wound is smaller today. There is a bit of slough accumulation. Still with a small amount of undermining. No concern for infection. ?Electronic Signature(s) ?Signed: 03/18/2022 10:30:38 AM By: Fredirick Maudlin MD FACS ?Entered By: Fredirick Maudlin on 03/18/2022 10:30:38 ?-------------------------------------------------------------------------------- ?Physician Orders Details ?Patient Name: ?Date of Service: ?DEA TO [name] 03/18/2022 9:30 A M ?Medical Record Number: [id number] ?Patient Account Number: [id number] ?Date of Birth/Sex: ?Treating RN: ?October 16, 1941 (81 y.o. 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 - Dr Celine Ahr Room 3 ?Return A ?Bathing/ Shower/ Hygiene ?May shower with protection but do not get wound dressing(s) wet. - Please wear Leg protection for left leg (Cast protector, you can purchase ?this from [facility], [facility] etc. $ 18-$20) ?Wound Treatment ?Wound #1 - Lower Leg Wound Laterality: Left, Medial ?Cleanser: Soap and Water 1 x Per Week/30 Days ?Discharge Instructions: May shower and wash wound with dial antibacterial soap and water prior to dressing change. ?Cleanser: Wound Cleanser 1 x Per Week/30 Days ?Discharge Instructions: Cleanse the wound with wound cleanser prior to applying a clean dressing using gauze sponges, not tissue or cotton balls. ?Topical: Mupirocin Ointment 1 x Per Week/30 Days ?Discharge Instructions: Apply Mupirocin (Bactroban) as instructed ?Prim Dressing: Hydrofera Blue Classic Foam, 2x2 in 1 x Per Week/30 Days ?ary ?Discharge Instructions: Moisten with saline prior to applying to wound bed ?Secondary Dressing: Zetuvit Plus 4x8 in 1 x Per Week/30 Days ?Discharge Instructions: Apply over primary dressing as directed. ?Compression Wrap: ThreePress (3 layer compression wrap) 1 x Per Week/30 Days ?Discharge Instructions: Apply three layer compression as directed. ?Electronic Signature(s) ?Signed: 03/18/2022 10:30:54 AM By: Fredirick Maudlin MD FACS ?Entered By: Fredirick Maudlin on 03/18/2022 10:30:53 ?-------------------------------------------------------------------------------- ?Problem List Details ?Patient Name: ?Date of Service: ?DEA TO [name] 03/18/2022 9:30 A M ?Medical Record Number: [id number] ?Patient Account Number: [id number] ?Date of Birth/Sex: ?Treating RN: ?July 16, 1941 (81 y.o. F)  Scotton, Mechele Claude ?Primary Care Provider: Marton Redwood ?Other Clinician: ?Referring Provider: ?Treating Provider/Extender: Fredirick Maudlin ?Marton Redwood ?Weeks in Treatment: 1 ?Active Problems ?ICD-10 ?Encounter ?C

## 2022-03-24 DIAGNOSIS — M25552 Pain in left hip: Secondary | ICD-10-CM | POA: Diagnosis not present

## 2022-03-25 ENCOUNTER — Encounter (HOSPITAL_BASED_OUTPATIENT_CLINIC_OR_DEPARTMENT_OTHER): Payer: PPO | Admitting: General Surgery

## 2022-03-25 DIAGNOSIS — L97822 Non-pressure chronic ulcer of other part of left lower leg with fat layer exposed: Secondary | ICD-10-CM | POA: Diagnosis not present

## 2022-03-25 DIAGNOSIS — I872 Venous insufficiency (chronic) (peripheral): Secondary | ICD-10-CM | POA: Diagnosis not present

## 2022-03-25 NOTE — Progress Notes (Signed)
[name] ([id number]) ?Visit Report for 03/25/2022 ?Arrival Information Details ?Patient Name: Date of Service: ?DEA TO [name] 03/25/2022 9:30 A M ?Medical Record Number: [id number] ?Patient Account Number: [id number] ?Date of Birth/Sex: Treating RN: ?[birth year] (81 y.o. F) [name], [name] Claude ?Primary Care Dealie Koelzer: Marton Redwood Other Clinician: ?Referring Roshad Hack: ?Treating Kamorie Aldous/Extender: Fredirick Maudlin ?Marton Redwood ?Weeks in Treatment: 2 ?Visit Information History Since Last Visit ?Added or deleted any medications: No ?Patient Arrived: Ambulatory ?Any new allergies or adverse reactions: No ?Arrival Time: 09:21 ?Had a fall or experienced change in No ?Accompanied By: self ?activities of daily living that may affect ?Transfer Assistance: None ?risk of falls: ?Patient Identification Verified: Yes ?Signs or symptoms of abuse/neglect since last visito No ?Patient Requires Transmission-Based Precautions: No ?Hospitalized since last visit: No ?Patient Has Alerts: Yes ?Implantable device outside of the clinic excluding No ?Patient Alerts: Patient on Blood Thinner ?cellular tissue based products placed in the center ?since last visit: ?Has Dressing in Place as Prescribed: Yes ?Pain Present Now: No ?Electronic Signature(s) ?Signed: 03/25/2022 12:08:14 PM By: Dellie Catholic RN ?Entered By: Dellie Catholic on 03/25/2022 09:23:31 ?-------------------------------------------------------------------------------- ?Compression Therapy Details ?Patient Name: Date of Service: ?DEA TO [name] 03/25/2022 9:30 A M ?Medical Record Number: [id number] ?Patient Account Number: [id number] ?Date of Birth/Sex: Treating RN: ?[birth year] (81 y.o. F) [name], [name] Claude ?Primary Care Amrita Radu: Marton Redwood Other Clinician: ?Referring Winston Sobczyk: ?Treating Carrera Kiesel/Extender: Fredirick Maudlin ?Marton Redwood ?Weeks in Treatment: 2 ?Compression Therapy Performed for Wound Assessment: Wound #1 Left,Medial Lower Leg ?Performed By:  Clinician Dellie Catholic, RN ?Compression Type: Three Layer ?Post Procedure Diagnosis ?Same as Pre-procedure ?Electronic Signature(s) ?Signed: 03/25/2022 12:08:14 PM By: Dellie Catholic RN ?Entered By: Dellie Catholic on 03/25/2022 09:45:59 ?-------------------------------------------------------------------------------- ?Encounter Discharge Information Details ?Patient Name: ?Date of Service: ?DEA TO [name] 03/25/2022 9:30 A M ?Medical Record Number: [id number] ?Patient Account Number: [id number] ?Date of Birth/Sex: ?Treating RN: ?1941-11-01 (81 y.o. F) [name], [name] Claude ?Primary Care Bianey Tesoro: Marton Redwood ?Other Clinician: ?Referring Geary Rufo: ?Treating Swara Donze/Extender: Fredirick Maudlin ?Marton Redwood ?Weeks in Treatment: 2 ?Encounter Discharge Information Items ?Discharge Condition: Stable ?Ambulatory Status: Ambulatory ?Discharge Destination: Home ?Transportation: Private Auto ?Accompanied By: self ?Schedule Follow-up Appointment: Yes ?Clinical Summary of Care: Patient Declined ?Electronic Signature(s) ?Signed: 03/25/2022 12:08:14 PM By: Dellie Catholic RN ?Entered By: Dellie Catholic on 03/25/2022 12:07:55 ?-------------------------------------------------------------------------------- ?Lower Extremity Assessment Details ?Patient Name: ?Date of Service: ?DEA TO [name] 03/25/2022 9:30 A M ?Medical Record Number: [id number] ?Patient Account Number: [id number] ?Date of Birth/Sex: ?Treating RN: ?January 25, 1941 (81 y.o. F) [name], [name] Claude ?Primary Care Ashland Wiseman: Marton Redwood ?Other Clinician: ?Referring Seve Monette: ?Treating Kynisha Memon/Extender: Fredirick Maudlin ?Marton Redwood ?Weeks in Treatment: 2 ?Edema Assessment ?Assessed: [Left: No] [Right: No] ?Edema: [Left: N] [Right: o] ?Calf ?Left: Right: ?Point of Measurement: 29 cm From Medial Instep 30.5 cm ?Ankle ?Left: Right: ?Point of Measurement: 8 cm From Medial Instep 19 cm ?Knee To Floor ?Left: Right: ?From Medial Instep 39 cm ?Electronic Signature(s) ?Signed:  03/25/2022 12:08:14 PM By: Dellie Catholic RN ?Entered By: Dellie Catholic on 03/25/2022 09:28:59 ?-------------------------------------------------------------------------------- ?Multi Wound Chart Details ?Patient Name: ?Date of Service: ?DEA TO [name] 03/25/2022 9:30 A M ?Medical Record Number: [id number] ?Patient Account Number: [id number] ?Date of Birth/Sex: ?Treating RN: ?Oct 13, 1941 (81 y.o. F) [name], [name] Claude ?Primary Care Brandell Maready: ?Other Clinician: ?Marton Redwood ?Referring Bhavik Cabiness: ?Treating Jamal Pavon/Extender: Fredirick Maudlin ?Marton Redwood ?Weeks in Treatment: 2 ?Vital Signs ?Height(in): 60 ?Pulse(bpm): ?Weight(lbs): 102 ?Blood Pressure(mmHg): ?Body Mass Index(BMI): 19.9 ?Temperature(??F): ?Respiratory Rate(breaths/min): 16 ?Photos: [N/A:N/A] ?  Left, Medial Lower Leg N/A N/A ?Wound Location: ?Trauma N/A N/A ?Wounding Event: ?Venous Leg Ulcer N/A N/A ?Primary Etiology: ?Cataracts, Hypertension N/A N/A ?Comorbid History: ?01/07/2022 N/A N/A ?Date Acquired: ?2 N/A N/A ?Weeks of Treatment: ?Open N/A N/A ?Wound Status: ?No N/A N/A ?Wound Recurrence: ?1x1x0.1 N/A N/A ?Measurements L x W x D (cm) ?0.785 N/A N/A ?A (cm?) : ?rea ?0.079 N/A N/A ?Volume (cm?) : ?45.10% N/A N/A ?% Reduction in A rea: ?72.40% N/A N/A ?% Reduction in Volume: ?Full Thickness Without Exposed N/A N/A ?Classification: ?Support Structures ?Medium N/A N/A ?Exudate Amount: ?Serosanguineous N/A N/A ?Exudate Type: ?red, brown N/A N/A ?Exudate Color: ?Large (67-100%) N/A N/A ?Granulation Amount: ?Red N/A N/A ?Granulation Quality: ?Small (1-33%) N/A N/A ?Necrotic Amount: ?Fat Layer (Subcutaneous Tissue): Yes N/A N/A ?Exposed Structures: ?Fascia: No ?Tendon: No ?Muscle: No ?Joint: No ?Bone: No ?Small (1-33%) N/A N/A ?Epithelialization: ?Compression Therapy N/A N/A ?Procedures Performed: ?Treatment Notes ?Wound #1 (Lower Leg) Wound Laterality: Left, Medial ?Cleanser ?Soap and Water ?Discharge Instruction: May shower and wash wound with dial  antibacterial soap and water prior to dressing change. ?Wound Cleanser ?Discharge Instruction: Cleanse the wound with wound cleanser prior to applying a clean dressing using gauze sponges, not tissue or cotton balls. ?Peri-Wound Care ?Topical ?Mupirocin Ointment ?Discharge Instruction: Apply Mupirocin (Bactroban) as instructed ?Primary Dressing ?Hydrofera Blue Classic Foam, 2x2 in ?Discharge Instruction: Moisten with saline prior to applying to wound bed ?Secondary Dressing ?Zetuvit Plus 4x8 in ?Discharge Instruction: Apply over primary dressing as directed. ?Secured With ?Compression Wrap ?ThreePress (3 layer compression wrap) ?Discharge Instruction: Apply three layer compression as directed. ?Compression Stockings ?Add-Ons ?Electronic Signature(s) ?Signed: 03/25/2022 9:48:58 AM By: Fredirick Maudlin MD FACS ?Signed: 03/25/2022 12:08:14 PM By: Dellie Catholic RN ?Entered By: Fredirick Maudlin on 03/25/2022 09:48:58 ?-------------------------------------------------------------------------------- ?Multi-Disciplinary Care Plan Details ?Patient Name: ?Date of Service: ?DEA TO [name] 03/25/2022 9:30 A M ?Medical Record Number: [id number] ?Patient Account Number: [id number] ?Date of Birth/Sex: ?Treating RN: ?1940-11-23 (81 y.o. F) [name], [name] Claude ?Primary Care Deborahann Poteat: Marton Redwood ?Other Clinician: ?Referring Shilynn Hoch: ?Treating Mrytle Bento/Extender: Fredirick Maudlin ?Marton Redwood ?Weeks in Treatment: 2 ?Active Inactive ?Wound/Skin Impairment ?Nursing Diagnoses: ?Knowledge deficit related to ulceration/compromised skin integrity ?Goals: ?Patient/caregiver will verbalize understanding of skin care regimen ?Date Initiated: 03/11/2022 ?Target Resolution Date: 04/08/2022 ?Goal Status: Active ?Interventions: ?Assess ulceration(s) every visit ?Treatment Activities: ?Skin care regimen initiated : 03/11/2022 ?Notes: ?Electronic Signature(s) ?Signed: 03/25/2022 12:08:14 PM By: Dellie Catholic RN ?Entered By: Dellie Catholic on  03/25/2022 09:46:49 ?-------------------------------------------------------------------------------- ?Pain Assessment Details ?Patient Name: ?Date of Service: ?DEA TO [name] 03/25/2022 9:30 A M ?Medical Record N

## 2022-03-25 NOTE — Progress Notes (Signed)
[name] ([id number]) ?Visit Report for 03/25/2022 ?Chief Complaint Document Details ?Patient Name: Date of Service: ?DEA TO [name] Batman R K. 03/25/2022 9:30 A M ?Medical Record Number: [id number] ?Patient Account Number: [id number] ?Date of Birth/Sex: Treating RN: ?1941/04/30 (81 y.o. F) Scotton, Mechele Claude ?Primary Care Provider: Marton Redwood Other Clinician: ?Referring Provider: ?Treating Provider/Extender: Fredirick Maudlin ?Marton Redwood ?Weeks in Treatment: 2 ?Information Obtained from: Patient ?Chief Complaint ?Patient seen for complaints of Non-Healing Wound. ?Electronic Signature(s) ?Signed: 03/25/2022 9:49:03 AM By: Fredirick Maudlin MD FACS ?Entered By: Fredirick Maudlin on 03/25/2022 09:49:02 ?-------------------------------------------------------------------------------- ?HPI Details ?Patient Name: Date of Service: ?DEA TO [name] Batman R K. 03/25/2022 9:30 A M ?Medical Record Number: [id number] ?Patient Account Number: [id number] ?Date of Birth/Sex: Treating RN: ?03-12-1941 (81 y.o. F) Scotton, Mechele Claude ?Primary Care Provider: Marton Redwood Other Clinician: ?Referring Provider: ?Treating Provider/Extender: Fredirick Maudlin ?Marton Redwood ?Weeks in Treatment: 2 ?History of Present Illness ?HPI Description: ADMISSION ?03/11/2022 ?This is an 81 year old woman with a past medical history notable for carotid artery disease, pulmonary hypertension, varicose veins, multiple orthopedic ?surgical procedures, and a perimembranous VSD who presents today with a nonhealing wound on her left anterior tibial surface. It occurred about 2 months ago ?when a vacuum cleaner implement fell from the closet and struck her in the leg. This resulted in a hematoma. Due to her multiple orthopedic procedures, she ?initially was seen at [facility]. They performed an x-ray that was negative and apparently basically told her that it would take some time to resolve. I am ?not entirely sure how the hematoma ended up being evacuated but she says  that her primary care doctor put her on doxycycline and has had her applying ?mupirocin with Prisma silver collagen to the site. She says in the past 7 days, since that treatment was initiated, she has noted significant improvement. It ?does not seem to have much drainage nor is there any odor. The periwound skin is in good condition. ?03/18/2022: The wound is smaller today. There is just a bit of slough accumulation on the surface. We have been using mupirocin and Hydrofera Blue. ?03/25/2022: The wound has contracted quite a bit today. There is no significant slough accumulation and there is a nice bit of granulation tissue. We have been ?using mupirocin with Hydrofera Blue and 3 layer compression. She asked if she could wear her compression hose instead of the 3 layer. ?Electronic Signature(s) ?Signed: 03/25/2022 9:49:50 AM By: Fredirick Maudlin MD FACS ?Entered By: Fredirick Maudlin on 03/25/2022 09:49:49 ?-------------------------------------------------------------------------------- ?Physical Exam Details ?Patient Name: Date of Service: ?DEA TO [name] Batman R K. 03/25/2022 9:30 A M ?Medical Record Number: [id number] ?Patient Account Number: [id number] ?Date of Birth/Sex: Treating RN: ?May 16, 1941 (81 y.o. F) Scotton, Mechele Claude ?Primary Care Provider: Marton Redwood Other Clinician: ?Referring Provider: ?Treating Provider/Extender: Fredirick Maudlin ?Marton Redwood ?Weeks in Treatment: 2 ?Constitutional ?. . . No acute distress. ?Respiratory ?Normal work of breathing on room air. ?Notes ?03/25/2022: The wound has contracted quite a bit today. There is no significant slough accumulation and there is a nice bed of granulation tissue. ?Electronic Signature(s) ?Signed: 03/25/2022 9:50:53 AM By: Fredirick Maudlin MD FACS ?Entered By: Fredirick Maudlin on 03/25/2022 09:50:53 ?-------------------------------------------------------------------------------- ?Physician Orders Details ?Patient Name: Date of Service: ?DEA TO [name] Batman R K.  03/25/2022 9:30 A M ?Medical Record Number: [id number] ?Patient Account Number: [id number] ?Date of Birth/Sex: Treating RN: ?1940-12-23 (81 y.o. F) Scotton, Mechele Claude ?Primary Care Provider: Marton Redwood Other Clinician: ?Referring Provider: ?Treating Provider/Extender: Fredirick Maudlin ?Marton Redwood ?Weeks in Treatment: 2 ?  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 - Dr Celine Ahr Room 3 ?Return A ?Bathing/ Shower/ Hygiene ?May shower with protection but do not get wound dressing(s) wet. - Please wear Leg protection for left leg (Cast protector, you can purchase ?this from [facility], [facility] etc. $ 18-$20) ?Wound Treatment ?Wound #1 - Lower Leg Wound Laterality: Left, Medial ?Cleanser: Soap and Water 1 x Per Week/30 Days ?Discharge Instructions: May shower and wash wound with dial antibacterial soap and water prior to dressing change. ?Cleanser: Wound Cleanser 1 x Per Week/30 Days ?Discharge Instructions: Cleanse the wound with wound cleanser prior to applying a clean dressing using gauze sponges, not tissue or cotton balls. ?Topical: Mupirocin Ointment 1 x Per Week/30 Days ?Discharge Instructions: Apply Mupirocin (Bactroban) as instructed ?Prim Dressing: Hydrofera Blue Classic Foam, 2x2 in 1 x Per Week/30 Days ?ary ?Discharge Instructions: Moisten with saline prior to applying to wound bed ?Secondary Dressing: Zetuvit Plus 4x8 in 1 x Per Week/30 Days ?Discharge Instructions: Apply over primary dressing as directed. ?Compression Wrap: ThreePress (3 layer compression wrap) 1 x Per Week/30 Days ?Discharge Instructions: Apply three layer compression as  directed. ?Electronic Signature(s) ?Signed: 03/25/2022 9:51:05 AM By: Fredirick Maudlin MD FACS ?Entered By: Fredirick Maudlin on 03/25/2022 09:51:05 ?-------------------------------------------------------------------------------- ?Problem List Details ?Patient Name: ?Date of Service: ?DEA TO [name] Batman R K. 03/25/2022 9:30 A M ?Medical Record Number: [id number] ?Patient Account Number: [id number] ?Date of Birth/Sex: ?Treating RN: ?[birth year] (81 y.o. F) Scotton, Mechele Claude ?Primary Care Provider: Marton Redwood ?Other Clinician: ?Referring Provider: ?Treating Provider/Extender: Fredirick Maudlin ?Marton Redwood ?Weeks in Treatment: 2 ?Active Problems ?ICD-10 ?Encounter ?Code Description Active Date MDM ?Diagnosis ?F57.322 Non-pressure chronic ulcer of other part of left lower leg with fat layer exposed4/28/2023 No Yes ?I83.93 Asymptomatic varicose veins of bilateral lower extremities 03/11/2022 No Yes ?I27.20 Pulmonary hypertension, unspecified 03/11/2022 No Yes ?I77.9 Disorder of arteries and arterioles, unspecified 03/11/2022 No Yes ?Q21.0 Ventricular septal defect 03/11/2022 No Yes ?Inactive Problems ?Resolved Problems ?Electronic Signature(s) ?Signed: 03/25/2022 9:48:52 AM By: Fredirick Maudlin MD FACS ?Entered By: Fredirick Maudlin on 03/25/2022 09:48:52 ?-------------------------------------------------------------------------------- ?Progress Note Details ?Patient Name: ?Date of Service: ?DEA TO [name] Batman R K. 03/25/2022 9:30 A M ?Medical Record Number: [id number] ?Patient Account Number: [id number] ?Date of Birth/Sex: ?Treating RN: ?10-Sep-1941 (81 y.o. F) Scotton, Mechele Claude ?Primary Care Provider: Marton Redwood ?Other Clinician: ?Referring Provider: ?Treating Provider/Extender: Fredirick Maudlin ?Marton Redwood ?Weeks in Treatment: 2 ?Subjective ?Chief Complaint ?Information obtained from Patient ?Patient seen for complaints of Non-Healing Wound. ?History of Present Illness (HPI) ?ADMISSION ?03/11/2022 ?This is an 81 year old woman with a  past medical history notable for carotid artery disease, pulmonary hypertension, varicose veins, multiple orthopedic ?surgical procedures, and a perimembranous VSD who presents today with a nonhealing wound on her left ant

## 2022-04-01 ENCOUNTER — Encounter (HOSPITAL_BASED_OUTPATIENT_CLINIC_OR_DEPARTMENT_OTHER): Payer: PPO | Admitting: General Surgery

## 2022-04-01 DIAGNOSIS — L97822 Non-pressure chronic ulcer of other part of left lower leg with fat layer exposed: Secondary | ICD-10-CM | POA: Diagnosis not present

## 2022-04-01 NOTE — Progress Notes (Signed)
[name], [name] ([id number]) Visit Report for 04/01/2022 Chief Complaint Document Details Patient Name: Date of Service: [name] 04/01/2022 8:45 A M Medical Record Number: [id number] Patient Account Number: [id number] Date of Birth/Sex: Treating RN: June 24, 1941 (81 y.o. [name] Primary Care Provider: Marton Redwood Other Clinician: Referring Provider: Treating Provider/Extender: [name] in Treatment: 3 Information Obtained from: Patient Chief Complaint Patient seen for complaints of Non-Healing Wound. Electronic Signature(s) Signed: 04/01/2022 8:56:26 AM By: Fredirick Maudlin MD FACS Entered By: Fredirick Maudlin on 04/01/2022 08:56:25 -------------------------------------------------------------------------------- HPI Details Patient Name: Date of Service: [name]. 04/01/2022 8:45 A M Medical Record Number: [id number] Patient Account Number: [id number] Date of Birth/Sex: Treating RN: 14-Apr-1941 (81 y.o. [name] Primary Care Provider: Marton Redwood Other Clinician: Referring Provider: Treating Provider/Extender: [name] in Treatment: 3 History of Present Illness HPI Description: ADMISSION 03/11/2022 This is an 81 year old woman with a past medical history notable for carotid artery disease, pulmonary hypertension, varicose veins, multiple orthopedic surgical procedures, and a perimembranous VSD who presents today with a nonhealing wound on her left anterior tibial surface. It occurred about 2 months ago when a vacuum cleaner implement fell from the closet and struck her in the leg. This resulted in a hematoma. Due to her multiple orthopedic procedures, she initially was seen at [facility]. They performed an x-ray that was negative and apparently basically told her that it would take some time to resolve. I am not entirely sure how the hematoma ended up being evacuated but she says  that her primary care doctor put her on doxycycline and has had her applying mupirocin with Prisma silver collagen to the site. She says in the past 7 days, since that treatment was initiated, she has noted significant improvement. It does not seem to have much drainage nor is there any odor. The periwound skin is in good condition. 03/18/2022: The wound is smaller today. There is just a bit of slough accumulation on the surface. We have been using mupirocin and Hydrofera Blue. 03/25/2022: The wound has contracted quite a bit today. There is no significant slough accumulation and there is a nice bit of granulation tissue. We have been using mupirocin with Hydrofera Blue and 3 layer compression. She asked if she could wear her compression hose instead of the 3 layer. 04/01/2022: The wound continues to contract. There is no slough and the surface is healthy with good granulation tissue. We have been using mupirocin with Hydrofera Blue and 3 layer compression. Electronic Signature(s) Signed: 04/01/2022 8:56:53 AM By: Fredirick Maudlin MD FACS Entered By: Fredirick Maudlin on 04/01/2022 08:56:53 -------------------------------------------------------------------------------- Physical Exam Details Patient Name: Date of Service: [name]. 04/01/2022 8:45 A M Medical Record Number: [id number] Patient Account Number: [id number] Date of Birth/Sex: Treating RN: 1940-11-16 (81 y.o. [name] Primary Care Provider: Marton Redwood Other Clinician: Referring Provider: Treating Provider/Extender: [name] in Treatment: 3 Constitutional Slightly hypertensive. . . . No acute distress. Respiratory Normal work of breathing on room air. Notes 04/01/2022: The wound continues to contract. There is no slough and the surface has healthy granulation tissue. Electronic Signature(s) Signed: 04/01/2022 8:58:26 AM By: Fredirick Maudlin MD FACS Entered By: Fredirick Maudlin on  04/01/2022 08:58:26 -------------------------------------------------------------------------------- Physician Orders Details Patient Name: Date of Service: [name]. 04/01/2022 8:45 A M Medical Record Number: [id number] Patient Account Number: [id number] Date of Birth/Sex: Treating  RN: [birth year] (81 y.o. [name] Primary Care Provider: Marton Redwood Other Clinician: Referring Provider: Treating Provider/Extender: [name] in Treatment: 3 Verbal / Phone Orders: No Diagnosis Coding ICD-10 Coding Code Description [phone] Non-pressure chronic ulcer of other part of left lower leg with fat layer exposed I83.93 Asymptomatic varicose veins of bilateral lower extremities I27.20 Pulmonary hypertension, unspecified I77.9 Disorder of arteries and arterioles, unspecified Q21.0 Ventricular septal defect Follow-up Appointments ppointment in 1 week. - Dr Celine Ahr Room 2 - 5/26 9:15 Return A Bathing/ Shower/ Hygiene May shower with protection but do not get wound dressing(s) wet. - Please wear Leg protection for left leg (Cast protector, you can purchase this from [facility], [facility] etc. $ 18-$20) Wound Treatment Wound #1 - Lower Leg Wound Laterality: Left, Medial Cleanser: Soap and Water 1 x Per Week/30 Days Discharge Instructions: May shower and wash wound with dial antibacterial soap and water prior to dressing change. Cleanser: Wound Cleanser 1 x Per Week/30 Days Discharge Instructions: Cleanse the wound with wound cleanser prior to applying a clean dressing using gauze sponges, not tissue or cotton balls. Topical: Mupirocin Ointment 1 x Per Week/30 Days Discharge Instructions: Apply Mupirocin (Bactroban) as instructed Prim Dressing: Hydrofera Blue Classic Foam, 2x2 in 1 x Per Week/30 Days ary Discharge Instructions: Moisten with saline prior to applying to wound bed Secondary Dressing: Zetuvit Plus 4x8 in 1 x Per Week/30 Days Discharge  Instructions: Apply over primary dressing as directed. Compression Wrap: ThreePress (3 layer compression wrap) 1 x Per Week/30 Days Discharge Instructions: Apply three layer compression as directed. Electronic Signature(s) Signed: 04/01/2022 8:58:40 AM By: Fredirick Maudlin MD FACS Entered By: Fredirick Maudlin on 04/01/2022 08:58:40 -------------------------------------------------------------------------------- Problem List Details Patient Name: Date of Service: [name]. 04/01/2022 8:45 A M Medical Record Number: [id number] Patient Account Number: [id number] Date of Birth/Sex: Treating RN: 07/12/1941 (81 y.o. [name] Primary Care Provider: Marton Redwood Other Clinician: Referring Provider: Treating Provider/Extender: [name] in Treatment: 3 Active Problems ICD-10 Encounter Code Description Active Date MDM Diagnosis [phone] Non-pressure chronic ulcer of other part of left lower leg with fat layer exposed4/28/2023 No Yes I83.93 Asymptomatic varicose veins of bilateral lower extremities 03/11/2022 No Yes I27.20 Pulmonary hypertension, unspecified 03/11/2022 No Yes I77.9 Disorder of arteries and arterioles, unspecified 03/11/2022 No Yes Q21.0 Ventricular septal defect 03/11/2022 No Yes Inactive Problems Resolved Problems Electronic Signature(s) Signed: 04/01/2022 8:55:35 AM By: Fredirick Maudlin MD FACS Entered By: Fredirick Maudlin on 04/01/2022 08:55:35 -------------------------------------------------------------------------------- Progress Note Details Patient Name: Date of Service: [name]. 04/01/2022 8:45 A M Medical Record Number: [id number] Patient Account Number: [id number] Date of Birth/Sex: Treating RN: June 04, 1941 (81 y.o. [name] Primary Care Provider: Marton Redwood Other Clinician: Referring Provider: Treating Provider/Extender: [name] in Treatment:  3 Subjective Chief Complaint Information obtained from Patient Patient seen for complaints of Non-Healing Wound. History of Present Illness (HPI) ADMISSION 03/11/2022 This is an 81 year old woman with a past medical history notable for carotid artery disease, pulmonary hypertension, varicose veins, multiple orthopedic surgical procedures, and a perimembranous VSD who presents today with a nonhealing wound on her left anterior tibial surface. It occurred about 2 months ago when a vacuum cleaner implement fell from the closet and struck her in the leg. This resulted in a hematoma. Due to her multiple orthopedic procedures, she initially was seen at [facility]. They performed an x-ray that was negative and apparently basically told  her that it would take some time to resolve. I am not entirely sure how the hematoma ended up being evacuated but she says that her primary care doctor put her on doxycycline and has had her applying mupirocin with Prisma silver collagen to the site. She says in the past 7 days, since that treatment was initiated, she has noted significant improvement. It does not seem to have much drainage nor is there any odor. The periwound skin is in good condition. 03/18/2022: The wound is smaller today. There is just a bit of slough accumulation on the surface. We have been using mupirocin and Hydrofera Blue. 03/25/2022: The wound has contracted quite a bit today. There is no significant slough accumulation and there is a nice bit of granulation tissue. We have been using mupirocin with Hydrofera Blue and 3 layer compression. She asked if she could wear her compression hose instead of the 3 layer. 04/01/2022: The wound continues to contract. There is no slough and the surface is healthy with good granulation tissue. We have been using mupirocin with Hydrofera Blue and 3 layer compression. Patient History Information obtained from Patient. Family History Unknown History. Social  History Never smoker, Marital Status - Married, Alcohol Use - Rarely, Drug Use - No History, Caffeine Use - Daily - coffee. Medical History Eyes Patient has history of Cataracts - Had cataracts removed Cardiovascular Patient has history of Hypertension Hospitalization/Surgery History - Bilateral Joint Replacements; Bilateral foot surgery (as per pt.). Medical A Surgical History Notes nd Respiratory Pulmonary Hypertension Cardiovascular Loop Recorder Cryptogenic Stroke-status post ILR Dyslipidemia Small perimembranous Ventricular Septal Defect Heart Murmur Objective Constitutional Slightly hypertensive. No acute distress. Vitals Time Taken: 8:38 AM, Height: 60 in, Weight: 102 lbs, BMI: 19.9, Temperature: 98.3 F, Pulse: 69 bpm, Respiratory Rate: 16 breaths/min, Blood Pressure: 153/76 mmHg. Respiratory Normal work of breathing on room air. General Notes: 04/01/2022: The wound continues to contract. There is no slough and the surface has healthy granulation tissue. Integumentary (Hair, Skin) Wound #1 status is Open. Original cause of wound was Trauma. The date acquired was: 01/07/2022. The wound has been in treatment 3 weeks. The wound is located on the Left,Medial Lower Leg. The wound measures 0.7cm length x 0.6cm width x 0.1cm depth; 0.33cm^2 area and 0.033cm^3 volume. There is Fat Layer (Subcutaneous Tissue) exposed. There is a medium amount of serosanguineous drainage noted. There is large (67-100%) red granulation within the wound bed. There is a small (1-33%) amount of necrotic tissue within the wound bed including Adherent Slough. Assessment Active Problems ICD-10 Non-pressure chronic ulcer of other part of left lower leg with fat layer exposed Asymptomatic varicose veins of bilateral lower extremities Pulmonary hypertension, unspecified Disorder of arteries and arterioles, unspecified Ventricular septal defect Plan Follow-up Appointments: Return Appointment in 1 week. - Dr  Celine Ahr Room 2 - 5/26 9:15 Bathing/ Shower/ Hygiene: May shower with protection but do not get wound dressing(s) wet. - Please wear Leg protection for left leg (Cast protector, you can purchase this from [facility], [facility] etc. $ 18-$20) WOUND #1: - Lower Leg Wound Laterality: Left, Medial Cleanser: Soap and Water 1 x Per Week/30 Days Discharge Instructions: May shower and wash wound with dial antibacterial soap and water prior to dressing change. Cleanser: Wound Cleanser 1 x Per Week/30 Days Discharge Instructions: Cleanse the wound with wound cleanser prior to applying a clean dressing using gauze sponges, not tissue or cotton balls. Topical: Mupirocin Ointment 1 x Per Week/30 Days Discharge Instructions: Apply Mupirocin (Bactroban) as instructed  Prim Dressing: Hydrofera Blue Classic Foam, 2x2 in 1 x Per Week/30 Days ary Discharge Instructions: Moisten with saline prior to applying to wound bed Secondary Dressing: Zetuvit Plus 4x8 in 1 x Per Week/30 Days Discharge Instructions: Apply over primary dressing as directed. Com pression Wrap: ThreePress (3 layer compression wrap) 1 x Per Week/30 Days Discharge Instructions: Apply three layer compression as directed. 04/01/2022: The wound continues to contract. There is no slough and the surface has healthy granulation tissue. No debridement was necessary today. We will continue topical mupirocin with Hydrofera Blue and 3 layer compression. Follow-up in 1 week. Electronic Signature(s) Signed: 04/01/2022 8:59:03 AM By: Fredirick Maudlin MD FACS Entered By: Fredirick Maudlin on 04/01/2022 08:59:03 -------------------------------------------------------------------------------- HxROS Details Patient Name: Date of Service: [name]. 04/01/2022 8:45 A M Medical Record Number: [id number] Patient Account Number: [id number] Date of Birth/Sex: Treating RN: December 06, 1940 (81 y.o. [name] Primary Care Provider: Marton Redwood Other  Clinician: Referring Provider: Treating Provider/Extender: [name] in Treatment: 3 Information Obtained From Patient Eyes Medical History: Positive for: Cataracts - Had cataracts removed Respiratory Medical History: Past Medical History Notes: Pulmonary Hypertension Cardiovascular Medical History: Positive for: Hypertension Past Medical History Notes: Loop Recorder Cryptogenic Stroke-status post ILR Dyslipidemia Small perimembranous Ventricular Septal Defect Heart Murmur HBO Extended History Items Eyes: Cataracts Immunizations Pneumococcal Vaccine: Received Pneumococcal Vaccination: Yes Received Pneumococcal Vaccination On or After 60th Birthday: Yes Implantable Devices No devices added Hospitalization / Surgery History Type of Hospitalization/Surgery Bilateral Joint Replacements; Bilateral foot surgery (as per pt.) Family and Social History Unknown History: Yes; Never smoker; Marital Status - Married; Alcohol Use: Rarely; Drug Use: No History; Caffeine Use: Daily - coffee; Financial Concerns: No; Food, Clothing or Shelter Needs: No; Support System Lacking: No; Transportation Concerns: No Electronic Signature(s) Signed: 04/01/2022 10:45:58 AM By: Dellie Catholic RN Signed: 04/01/2022 12:16:20 PM By: Fredirick Maudlin MD FACS Entered By: Fredirick Maudlin on 04/01/2022 08:57:23 -------------------------------------------------------------------------------- SuperBill Details Patient Name: Date of Service: [name]. 04/01/2022 Medical Record Number: [id number] Patient Account Number: [id number] Date of Birth/Sex: Treating RN: 07-Nov-1941 (81 y.o. [name] Primary Care Provider: Marton Redwood Other Clinician: Referring Provider: Treating Provider/Extender: [name] in Treatment: 3 Diagnosis Coding ICD-10 Codes Code Description [phone] Non-pressure chronic ulcer of other part of left lower  leg with fat layer exposed I83.93 Asymptomatic varicose veins of bilateral lower extremities I27.20 Pulmonary hypertension, unspecified I77.9 Disorder of arteries and arterioles, unspecified Q21.0 Ventricular septal defect Facility Procedures CPT4 Code: [id number] Description: (Facility Use Only) [phone] - [facility] L[facility] LT LEG Modifier: Quantity: 1 Physician Procedures : CPT4 Code Description Modifier [id number] - WC PHYS LEVEL 3 - EST PT ICD-10 Diagnosis Description L97.822 Non-pressure chronic ulcer of other part of left lower leg with fat layer exposed I83.93 Asymptomatic varicose veins of bilateral lower  extremities Quantity: 1 Electronic Signature(s) Signed: 04/01/2022 10:45:58 AM By: Dellie Catholic RN Signed: 04/01/2022 12:16:20 PM By: Fredirick Maudlin MD FACS Previous Signature: 04/01/2022 8:59:15 AM Version By: Fredirick Maudlin MD FACS Entered By: Dellie Catholic on 04/01/2022 10:44:58

## 2022-04-05 NOTE — Progress Notes (Signed)
NDEA, KILROY ([id number]) Visit Report for 04/01/2022 Arrival Information Details Patient Name: Date of Service: [name] 04/01/2022 8:45 A M Medical Record Number: [id number] Patient Account Number: [id number] Date of Birth/Sex: Treating RN: May 27, 1941 (81 y.o. [name] Primary Care Nonie Lochner: Marton Redwood Other Clinician: Referring Ottis Sarnowski: Treating Jamile Sivils/Extender: Bethena Roys in Treatment: 3 Visit Information History Since Last Visit Added or deleted any medications: No Patient Arrived: Ambulatory Any new allergies or adverse reactions: No Arrival Time: 08:38 Had a fall or experienced change in No Accompanied By: self activities of daily living that may affect Transfer Assistance: None risk of falls: Patient Identification Verified: Yes Signs or symptoms of abuse/neglect since last visito No Secondary Verification Process Completed: Yes Hospitalized since last visit: No Patient Requires Transmission-Based Precautions: No Implantable device outside of the clinic excluding No Patient Has Alerts: Yes cellular tissue based products placed in the center Patient Alerts: Patient on Blood Thinner since last visit: Has Dressing in Place as Prescribed: Yes Pain Present Now: No Electronic Signature(s) Signed: 04/05/2022 7:59:49 AM By: Sandre Kitty Entered By: Sandre Kitty on 04/01/2022 08:38:38 -------------------------------------------------------------------------------- Compression Therapy Details Patient Name: Date of Service: [name]. 04/01/2022 8:45 A M Medical Record Number: [id number] Patient Account Number: [id number] Date of Birth/Sex: Treating RN: 1941/01/27 (81 y.o. [name] Primary Care Garlen Reinig: Marton Redwood Other Clinician: Referring Lennyn Gange: Treating Jaken Fregia/Extender: Bethena Roys in Treatment: 3 Compression Therapy Performed for Wound Assessment: Wound #1  Left,Medial Lower Leg Performed By: Clinician Adline Peals, RN Compression Type: Three Layer Post Procedure Diagnosis Same as Pre-procedure Electronic Signature(s) Signed: 04/01/2022 10:45:58 AM By: Dellie Catholic RN Entered By: Dellie Catholic on 04/01/2022 10:44:36 -------------------------------------------------------------------------------- Encounter Discharge Information Details Patient Name: Date of Service: [name]. 04/01/2022 8:45 A M Medical Record Number: [id number] Patient Account Number: [id number] Date of Birth/Sex: Treating RN: [birth year] (81 y.o. [name] Primary Care Kaydense Rizo: Marton Redwood Other Clinician: Referring Anntoinette Haefele: Treating Janette Harvie/Extender: Bethena Roys in Treatment: 3 Encounter Discharge Information Items Discharge Condition: Stable Ambulatory Status: Ambulatory Discharge Destination: Home Transportation: Private Auto Accompanied By: self Schedule Follow-up Appointment: Yes Clinical Summary of Care: Patient Declined Electronic Signature(s) Signed: 04/01/2022 10:45:58 AM By: Dellie Catholic RN Entered By: Dellie Catholic on 04/01/2022 10:45:29 -------------------------------------------------------------------------------- Lower Extremity Assessment Details Patient Name: Date of Service: [name]. 04/01/2022 8:45 A M Medical Record Number: [id number] Patient Account Number: [id number] Date of Birth/Sex: Treating RN: January 07, 1941 (81 y.o. [name] Primary Care Cheyrl Buley: Marton Redwood Other Clinician: Referring Raeonna Milo: Treating Zedric Deroy/Extender: Bethena Roys in Treatment: 3 Edema Assessment Assessed: Shirlyn Goltz: No] [Right: No] Edema: [Left: N] [Right: o] Calf Left: Right: Point of Measurement: 29 cm From Medial Instep 31 cm Ankle Left: Right: Point of Measurement: 8 cm From Medial Instep 19.5 cm Vascular Assessment Pulses: Dorsalis  Pedis Palpable: [Left:Yes] Electronic Signature(s) Signed: 04/01/2022 3:21:06 PM By: Adline Peals Entered By: Adline Peals on 04/01/2022 08:49:11 -------------------------------------------------------------------------------- Multi Wound Chart Details Patient Name: Date of Service: [name]. 04/01/2022 8:45 A M Medical Record Number: [id number] Patient Account Number: [id number] Date of Birth/Sex: Treating RN: November 09, 1941 (81 y.o. [name] Primary Care Demario Faniel: Marton Redwood Other Clinician: Referring Leif Loflin: Treating Abdulrahman Bracey/Extender: Bethena Roys in Treatment: 3 Vital Signs Height(in): 60 Pulse(bpm): 69 Weight(lbs): 102 Blood Pressure(mmHg): 153/76 Body Mass Index(BMI): 19.9 Temperature(F): 98.3 Respiratory Rate(breaths/min):  16 Photos: [N/A:N/A] Left, Medial Lower Leg N/A N/A Wound Location: Trauma N/A N/A Wounding Event: Venous Leg Ulcer N/A N/A Primary Etiology: Cataracts, Hypertension N/A N/A Comorbid History: 01/07/2022 N/A N/A Date Acquired: 3 N/A N/A Weeks of Treatment: Open N/A N/A Wound Status: No N/A N/A Wound Recurrence: 0.7x0.6x0.1 N/A N/A Measurements L x W x D (cm) 0.33 N/A N/A A (cm) : rea 0.033 N/A N/A Volume (cm) : 76.90% N/A N/A % Reduction in A rea: 88.50% N/A N/A % Reduction in Volume: Full Thickness Without Exposed N/A N/A Classification: Support Structures Medium N/A N/A Exudate Amount: Serosanguineous N/A N/A Exudate Type: red, brown N/A N/A Exudate Color: Large (67-100%) N/A N/A Granulation Amount: Red N/A N/A Granulation Quality: Small (1-33%) N/A N/A Necrotic Amount: Fat Layer (Subcutaneous Tissue): Yes N/A N/A Exposed Structures: Fascia: No Tendon: No Muscle: No Joint: No Bone: No Small (1-33%) N/A N/A Epithelialization: Treatment Notes Electronic Signature(s) Signed: 04/01/2022 8:56:13 AM By: Fredirick Maudlin MD FACS Signed: 04/01/2022 10:45:58 AM  By: Dellie Catholic RN Entered By: Fredirick Maudlin on 04/01/2022 08:56:13 -------------------------------------------------------------------------------- Multi-Disciplinary Care Plan Details Patient Name: Date of Service: DEA TO Herma Carson K. 04/01/2022 8:45 A M Medical Record Number: [id number] Patient Account Number: [id number] Date of Birth/Sex: Treating RN: 04/04/1941 (81 y.o. [name] Primary Care Shawntia Mangal: Marton Redwood Other Clinician: Referring Darra Rosa: Treating Lorenia Hoston/Extender: Bethena Roys in Treatment: 3 Active Inactive Wound/Skin Impairment Nursing Diagnoses: Knowledge deficit related to ulceration/compromised skin integrity Goals: Patient/caregiver will verbalize understanding of skin care regimen Date Initiated: 03/11/2022 Target Resolution Date: 04/08/2022 Goal Status: Active Interventions: Assess ulceration(s) every visit Treatment Activities: Skin care regimen initiated : 03/11/2022 Notes: Electronic Signature(s) Signed: 04/01/2022 3:21:06 PM By: Adline Peals Entered By: Adline Peals on 04/01/2022 08:49:21 -------------------------------------------------------------------------------- Pain Assessment Details Patient Name: Date of Service: [name]. 04/01/2022 8:45 A M Medical Record Number: [id number] Patient Account Number: [id number] Date of Birth/Sex: Treating RN: 1940/11/26 (81 y.o. [name] Primary Care Stephaney Steven: Marton Redwood Other Clinician: Referring Adir Schicker: Treating Jarriel Papillion/Extender: Bethena Roys in Treatment: 3 Active Problems Location of Pain Severity and Description of Pain Patient Has Paino No Site Locations Pain Management and Medication Current Pain Management: Electronic Signature(s) Signed: 04/01/2022 10:45:58 AM By: Dellie Catholic RN Signed: 04/05/2022 7:59:49 AM By: Sandre Kitty Entered By: Sandre Kitty on 04/01/2022  08:39:03 -------------------------------------------------------------------------------- Patient/Caregiver Education Details Patient Name: Date of Service: [name] 5/19/2023andnbsp8:45 A M Medical Record Number: [id number] Patient Account Number: [id number] Date of Birth/Gender: Treating RN: 10-28-1941 (81 y.o. [name] Primary Care Physician: Marton Redwood Other Clinician: Referring Physician: Treating Physician/Extender: Bethena Roys in Treatment: 3 Education Assessment Education Provided To: Patient Education Topics Provided Wound/Skin Impairment: Methods: Explain/Verbal Responses: Reinforcements needed, State content correctly Electronic Signature(s) Signed: 04/01/2022 3:21:06 PM By: Adline Peals Entered By: Adline Peals on 04/01/2022 08:49:32 -------------------------------------------------------------------------------- Wound Assessment Details Patient Name: Date of Service: [name]. 04/01/2022 8:45 A M Medical Record Number: [id number] Patient Account Number: [id number] Date of Birth/Sex: Treating RN: [birth year] (81 y.o. [name] Primary Care Bindu Docter: Marton Redwood Other Clinician: Referring Tere Mcconaughey: Treating Ebin Palazzi/Extender: Bethena Roys in Treatment: 3 Wound Status Wound Number: 1 Primary Etiology: Venous Leg Ulcer Wound Location: Left, Medial Lower Leg Wound Status: Open Wounding Event: Trauma Comorbid History: Cataracts, Hypertension Date Acquired: 01/07/2022 Weeks Of Treatment: 3 Clustered Wound: No Photos Wound Measurements Length: (cm) 0.7 Width: (cm)  0.6 Depth: (cm) 0.1 Area: (cm) 0.33 Volume: (cm) 0.033 % Reduction in Area: 76.9% % Reduction in Volume: 88.5% Epithelialization: Small (1-33%) Wound Description Classification: Full Thickness Without Exposed Support Structures Exudate Amount: Medium Exudate Type:  Serosanguineous Exudate Color: red, brown Foul Odor After Cleansing: No Slough/Fibrino Yes Wound Bed Granulation Amount: Large (67-100%) Exposed Structure Granulation Quality: Red Fascia Exposed: No Necrotic Amount: Small (1-33%) Fat Layer (Subcutaneous Tissue) Exposed: Yes Necrotic Quality: Adherent Slough Tendon Exposed: No Muscle Exposed: No Joint Exposed: No Bone Exposed: No Treatment Notes Wound #1 (Lower Leg) Wound Laterality: Left, Medial Cleanser Soap and Water Discharge Instruction: May shower and wash wound with dial antibacterial soap and water prior to dressing change. Wound Cleanser Discharge Instruction: Cleanse the wound with wound cleanser prior to applying a clean dressing using gauze sponges, not tissue or cotton balls. Peri-Wound Care Topical Mupirocin Ointment Discharge Instruction: Apply Mupirocin (Bactroban) as instructed Primary Dressing Hydrofera Blue Classic Foam, 2x2 in Discharge Instruction: Moisten with saline prior to applying to wound bed Secondary Dressing Zetuvit Plus 4x8 in Discharge Instruction: Apply over primary dressing as directed. Secured With Compression Wrap ThreePress (3 layer compression wrap) Discharge Instruction: Apply three layer compression as directed. Compression Stockings Add-Ons Electronic Signature(s) Signed: 04/01/2022 10:45:58 AM By: Dellie Catholic RN Signed: 04/05/2022 7:59:49 AM By: Sandre Kitty Entered By: Sandre Kitty on 04/01/2022 08:43:51 -------------------------------------------------------------------------------- Vitals Details Patient Name: Date of Service: [name]. 04/01/2022 8:45 A M Medical Record Number: [id number] Patient Account Number: [id number] Date of Birth/Sex: Treating RN: 09/03/1941 (81 y.o. [name] Primary Care Malon Branton: Marton Redwood Other Clinician: Referring Finnley Larusso: Treating Rey Dansby/Extender: Bethena Roys in Treatment:  3 Vital Signs Time Taken: 08:38 Temperature (F): 98.3 Height (in): 60 Pulse (bpm): 69 Weight (lbs): 102 Respiratory Rate (breaths/min): 16 Body Mass Index (BMI): 19.9 Blood Pressure (mmHg): 153/76 Reference Range: 80 - 120 mg / dl Electronic Signature(s) Signed: 04/05/2022 7:59:49 AM By: Sandre Kitty Entered By: Sandre Kitty on 04/01/2022 08:38:57

## 2022-04-08 ENCOUNTER — Encounter (HOSPITAL_BASED_OUTPATIENT_CLINIC_OR_DEPARTMENT_OTHER): Payer: PPO | Admitting: General Surgery

## 2022-04-08 DIAGNOSIS — L97822 Non-pressure chronic ulcer of other part of left lower leg with fat layer exposed: Secondary | ICD-10-CM | POA: Diagnosis not present

## 2022-04-08 DIAGNOSIS — I8393 Asymptomatic varicose veins of bilateral lower extremities: Secondary | ICD-10-CM | POA: Diagnosis not present

## 2022-04-08 NOTE — Progress Notes (Signed)
VENIDA, TSUKAMOTO ([id number]) Visit Report for 04/08/2022 Arrival Information Details Patient Name: Date of Service: [name] 04/08/2022 10:00 A M Medical Record Number: [id number] Patient Account Number: [id number] Date of Birth/Sex: Treating RN: 1941/05/31 (81 y.o. [name] Primary Care Vernesha Talbot: Marton Redwood Other Clinician: Referring Calhoun Reichardt: Treating Jaloni Sorber/Extender: Bethena Roys in Treatment: 4 Visit Information History Since Last Visit Added or deleted any medications: No Patient Arrived: Ambulatory Any new allergies or adverse reactions: No Arrival Time: 09:48 Had a fall or experienced change in No Accompanied By: self activities of daily living that may affect Transfer Assistance: None risk of falls: Patient Identification Verified: Yes Signs or symptoms of abuse/neglect since last visito No Secondary Verification Process Completed: Yes Hospitalized since last visit: No Patient Requires Transmission-Based Precautions: No Implantable device outside of the clinic excluding No Patient Has Alerts: Yes cellular tissue based products placed in the center Patient Alerts: Patient on Blood Thinner since last visit: Has Dressing in Place as Prescribed: Yes Has Compression in Place as Prescribed: Yes Pain Present Now: No Electronic Signature(s) Signed: 04/08/2022 4:34:16 PM By: Adline Peals Entered By: Adline Peals on 04/08/2022 09:52:49 -------------------------------------------------------------------------------- Clinic Level of Care Assessment Details Patient Name: Date of Service: [name] 04/08/2022 10:00 A M Medical Record Number: [id number] Patient Account Number: [id number] Date of Birth/Sex: Treating RN: 07/29/1941 (81 y.o. [name] Primary Care Toi Stelly: Marton Redwood Other Clinician: Referring Tess Potts: Treating Hailey Miles/Extender: Bethena Roys in  Treatment: 4 Clinic Level of Care Assessment Items TOOL 4 Quantity Score X- 1 0 Use when only an EandM is performed on FOLLOW-UP visit ASSESSMENTS - Nursing Assessment / Reassessment X- 1 10 Reassessment of Co-morbidities (includes updates in patient status) X- 1 5 Reassessment of Adherence to Treatment Plan ASSESSMENTS - Wound and Skin A ssessment / Reassessment []  - 0 Simple Wound Assessment / Reassessment - one wound []  - 0 Complex Wound Assessment / Reassessment - multiple wounds []  - 0 Dermatologic / Skin Assessment (not related to wound area) ASSESSMENTS - Focused Assessment X- 1 5 Circumferential Edema Measurements - multi extremities []  - 0 Nutritional Assessment / Counseling / Intervention X- 1 5 Lower Extremity Assessment (monofilament, tuning fork, pulses) []  - 0 Peripheral Arterial Disease Assessment (using hand held doppler) ASSESSMENTS - Ostomy and/or Continence Assessment and Care []  - 0 Incontinence Assessment and Management []  - 0 Ostomy Care Assessment and Management (repouching, etc.) PROCESS - Coordination of Care X - Simple Patient / Family Education for ongoing care 1 15 []  - 0 Complex (extensive) Patient / Family Education for ongoing care X- 1 10 Staff obtains Programmer, systems, Records, T Results / Process Orders est []  - 0 Staff telephones HHA, Nursing Homes / Clarify orders / etc []  - 0 Routine Transfer to another Facility (non-emergent condition) []  - 0 Routine Hospital Admission (non-emergent condition) []  - 0 New Admissions / Biomedical engineer / Ordering NPWT Apligraf, etc. , []  - 0 Emergency Hospital Admission (emergent condition) X- 1 10 Simple Discharge Coordination []  - 0 Complex (extensive) Discharge Coordination PROCESS - Special Needs []  - 0 Pediatric / Minor Patient Management []  - 0 Isolation Patient Management []  - 0 Hearing / Language / Visual special needs []  - 0 Assessment of Community assistance (transportation,  D/C planning, etc.) []  - 0 Additional assistance / Altered mentation []  - 0 Support Surface(s) Assessment (bed, cushion, seat, etc.) INTERVENTIONS - Wound Cleansing / Measurement X - Simple  Wound Cleansing - one wound 1 5 []  - 0 Complex Wound Cleansing - multiple wounds X- 1 5 Wound Imaging (photographs - any number of wounds) []  - 0 Wound Tracing (instead of photographs) X- 1 5 Simple Wound Measurement - one wound []  - 0 Complex Wound Measurement - multiple wounds INTERVENTIONS - Wound Dressings X - Small Wound Dressing one or multiple wounds 1 10 []  - 0 Medium Wound Dressing one or multiple wounds []  - 0 Large Wound Dressing one or multiple wounds []  - 0 Application of Medications - topical []  - 0 Application of Medications - injection INTERVENTIONS - Miscellaneous []  - 0 External ear exam []  - 0 Specimen Collection (cultures, biopsies, blood, body fluids, etc.) []  - 0 Specimen(s) / Culture(s) sent or taken to Lab for analysis []  - 0 Patient Transfer (multiple staff / Civil Service fast streamer / Similar devices) []  - 0 Simple Staple / Suture removal (25 or less) []  - 0 Complex Staple / Suture removal (26 or more) []  - 0 Hypo / Hyperglycemic Management (close monitor of Blood Glucose) []  - 0 Ankle / Brachial Index (ABI) - do not check if billed separately X- 1 5 Vital Signs Has the patient been seen at the hospital within the last three years: Yes Total Score: 90 Level Of Care: New/Established - Level 3 Electronic Signature(s) Signed: 04/08/2022 4:34:16 PM By: Adline Peals Entered By: Adline Peals on 04/08/2022 10:24:55 -------------------------------------------------------------------------------- Encounter Discharge Information Details Patient Name: Date of Service: [name]. 04/08/2022 10:00 A M Medical Record Number: [id number] Patient Account Number: [id number] Date of Birth/Sex: Treating RN: October 26, 1941 (81 y.o. [name] Primary Care  Jesilyn Easom: Marton Redwood Other Clinician: Referring Astoria Condon: Treating Mckynzi Cammon/Extender: Bethena Roys in Treatment: 4 Encounter Discharge Information Items Discharge Condition: Stable Ambulatory Status: Ambulatory Discharge Destination: Home Transportation: Private Auto Accompanied By: self Schedule Follow-up Appointment: Yes Clinical Summary of Care: Patient Declined Electronic Signature(s) Signed: 04/08/2022 4:34:16 PM By: Sabas Sous By: Adline Peals on 04/08/2022 10:25:30 -------------------------------------------------------------------------------- Lower Extremity Assessment Details Patient Name: Date of Service: [name]. 04/08/2022 10:00 A M Medical Record Number: [id number] Patient Account Number: [id number] Date of Birth/Sex: Treating RN: [birth year] (81 y.o. [name] Primary Care Akash Winski: Marton Redwood Other Clinician: Referring Arieanna Pressey: Treating Jenesys Casseus/Extender: Bethena Roys in Treatment: 4 Edema Assessment Assessed: Shirlyn Goltz: No] [Right: No] Edema: [Left: N] [Right: o] Calf Left: Right: Point of Measurement: 29 cm From Medial Instep 30.1 cm Ankle Left: Right: Point of Measurement: 8 cm From Medial Instep 20 cm Vascular Assessment Pulses: Dorsalis Pedis Palpable: [Left:Yes] Electronic Signature(s) Signed: 04/08/2022 4:34:16 PM By: Adline Peals Entered By: Adline Peals on 04/08/2022 09:57:53 -------------------------------------------------------------------------------- Multi Wound Chart Details Patient Name: Date of Service: DEA TO [name] Carson K. 04/08/2022 10:00 A M Medical Record Number: [id number] Patient Account Number: [id number] Date of Birth/Sex: Treating RN: Jul 08, 1941 (81 y.o. [name] Primary Care Joselin Crandell: Marton Redwood Other Clinician: Referring Enrico Eaddy: Treating Lynton Crescenzo/Extender: Bethena Roys in  Treatment: 4 Vital Signs Height(in): 60 Pulse(bpm): 14 Weight(lbs): 102 Blood Pressure(mmHg): 150/70 Body Mass Index(BMI): 19.9 Temperature(F): 98.4 Respiratory Rate(breaths/min): 16 Photos: [1:No Photos Left, Medial Lower Leg] [N/A:N/A N/A] Wound Location: [1:Trauma] [N/A:N/A] Wounding Event: [1:Venous Leg Ulcer] [N/A:N/A] Primary Etiology: [1:Cataracts, Hypertension] [N/A:N/A] Comorbid History: [1:01/07/2022] [N/A:N/A] Date Acquired: [1:4] [N/A:N/A] Weeks of Treatment: [1:Open] [N/A:N/A] Wound Status: [1:No] [N/A:N/A] Wound Recurrence: [1:0.7x0.6x0.1] [N/A:N/A] Measurements L x W x D (cm) [1:0.33] [N/A:N/A]  A (cm) : rea [1:0.033] [N/A:N/A] Volume (cm) : [1:76.90%] [N/A:N/A] % Reduction in A rea: [1:88.50%] [N/A:N/A] % Reduction in Volume: [1:Full Thickness Without Exposed] [N/A:N/A] Classification: [1:Support Structures Medium] [N/A:N/A] Exudate Amount: [1:Serosanguineous] [N/A:N/A] Exudate Type: [1:red, brown] [N/A:N/A] Exudate Color: [1:Large (67-100%)] [N/A:N/A] Granulation Amount: [1:Red] [N/A:N/A] Granulation Quality: [1:None Present (0%)] [N/A:N/A] Necrotic Amount: [1:Fat Layer (Subcutaneous Tissue): Yes N/A] Exposed Structures: [1:Fascia: No Tendon: No Muscle: No Joint: No Bone: No Medium (34-66%)] [N/A:N/A] Treatment Notes Electronic Signature(s) Signed: 04/08/2022 10:09:58 AM By: Fredirick Maudlin MD FACS Signed: 04/08/2022 4:34:16 PM By: Adline Peals Entered By: Fredirick Maudlin on 04/08/2022 10:09:57 -------------------------------------------------------------------------------- [place] Details Patient Name: Date of Service: DEA TO [name] Carson K. 04/08/2022 10:00 A M Medical Record Number: [id number] Patient Account Number: [id number] Date of Birth/Sex: Treating RN: 12/22/1940 (81 y.o. [name] Primary Care Tambra Muller: Marton Redwood Other Clinician: Referring Aris Even: Treating Kelcy Baeten/Extender: Bethena Roys in Treatment: 4 Active Inactive Wound/Skin Impairment Nursing Diagnoses: Knowledge deficit related to ulceration/compromised skin integrity Goals: Patient/caregiver will verbalize understanding of skin care regimen Date Initiated: 03/11/2022 Target Resolution Date: 05/06/2022 Goal Status: Active Interventions: Assess ulceration(s) every visit Treatment Activities: Skin care regimen initiated : 03/11/2022 Notes: Electronic Signature(s) Signed: 04/08/2022 4:34:16 PM By: Adline Peals Entered By: Adline Peals on 04/08/2022 09:59:09 -------------------------------------------------------------------------------- Pain Assessment Details Patient Name: Date of Service: [name]. 04/08/2022 10:00 A M Medical Record Number: [id number] Patient Account Number: [id number] Date of Birth/Sex: Treating RN: December 15, 1940 (81 y.o. [name] Primary Care Bonnetta Allbee: Marton Redwood Other Clinician: Referring Roseanna Koplin: Treating Chirag Krueger/Extender: Bethena Roys in Treatment: 4 Active Problems Location of Pain Severity and Description of Pain Patient Has Paino No Site Locations Rate the pain. Rate the pain. Current Pain Level: 0 Pain Management and Medication Current Pain Management: Electronic Signature(s) Signed: 04/08/2022 4:34:16 PM By: Adline Peals Entered By: Adline Peals on 04/08/2022 09:54:09 -------------------------------------------------------------------------------- Patient/Caregiver Education Details Patient Name: Date of Service: [name] 5/26/2023andnbsp10:00 A M Medical Record Number: [id number] Patient Account Number: [id number] Date of Birth/Gender: Treating RN: 12-08-1940 (81 y.o. [name] Primary Care Physician: Marton Redwood Other Clinician: Referring Physician: Treating Physician/Extender: Bethena Roys in Treatment:  4 Education Assessment Education Provided To: Patient Education Topics Provided Wound/Skin Impairment: Methods: Explain/Verbal Responses: Reinforcements needed, State content correctly Electronic Signature(s) Signed: 04/08/2022 4:34:16 PM By: Adline Peals Entered By: Adline Peals on 04/08/2022 09:59:27 -------------------------------------------------------------------------------- Wound Assessment Details Patient Name: Date of Service: [name]. 04/08/2022 10:00 A M Medical Record Number: [id number] Patient Account Number: [id number] Date of Birth/Sex: Treating RN: 11/15/1940 (81 y.o. [name] Primary Care Raevon Broom: Marton Redwood Other Clinician: Referring Traivon Morrical: Treating Braylei Totino/Extender: Bethena Roys in Treatment: 4 Wound Status Wound Number: 1 Primary Etiology: Venous Leg Ulcer Wound Location: Left, Medial Lower Leg Wound Status: Open Wounding Event: Trauma Comorbid History: Cataracts, Hypertension Date Acquired: 01/07/2022 Weeks Of Treatment: 4 Clustered Wound: No Wound Measurements Length: (cm) 0.7 Width: (cm) 0.6 Depth: (cm) 0.1 Area: (cm) 0.33 Volume: (cm) 0.033 % Reduction in Area: 76.9% % Reduction in Volume: 88.5% Epithelialization: Medium (34-66%) Tunneling: No Undermining: No Wound Description Classification: Full Thickness Without Exposed Support Structures Exudate Amount: Medium Exudate Type: Serosanguineous Exudate Color: red, brown Foul Odor After Cleansing: No Slough/Fibrino No Wound Bed Granulation Amount: Large (67-100%) Exposed Structure Granulation Quality: Red Fascia Exposed: No Necrotic Amount: None Present (0%) Fat Layer (Subcutaneous  Tissue) Exposed: Yes Tendon Exposed: No Muscle Exposed: No Joint Exposed: No Bone Exposed: No Treatment Notes Wound #1 (Lower Leg) Wound Laterality: Left, Medial Cleanser Soap and Water Discharge Instruction: May shower and wash wound  with dial antibacterial soap and water prior to dressing change. Wound Cleanser Discharge Instruction: Cleanse the wound with wound cleanser prior to applying a clean dressing using gauze sponges, not tissue or cotton balls. [facility] Ancillary Kit - 15 Day Supply Discharge Instruction: Use supplies as instructed; Kit contains: (15) Saline Bullets; (15) 3x3 Gauze; 15 pr Gloves Peri-Wound Care Topical Primary Dressing Promogran Prisma Matrix, 4.34 (sq in) (silver collagen) Discharge Instruction: Moisten collagen with saline or hydrogel Secondary Dressing Bordered Gauze, 2x2 in Discharge Instruction: Apply over primary dressing as directed. Secured With Compression Wrap Compression Stockings Environmental education officer) Signed: 04/08/2022 4:34:16 PM By: Adline Peals Entered By: Adline Peals on 04/08/2022 09:58:55 -------------------------------------------------------------------------------- Vitals Details Patient Name: Date of Service: DEA TO [name] Carson K. 04/08/2022 10:00 A M Medical Record Number: [id number] Patient Account Number: [id number] Date of Birth/Sex: Treating RN: [birth year] (81 y.o. [name] Primary Care Kaleeah Gingerich: Marton Redwood Other Clinician: Referring Taziah Difatta: Treating Rey Dansby/Extender: Bethena Roys in Treatment: 4 Vital Signs Time Taken: 09:53 Temperature (F): 98.4 Height (in): 60 Pulse (bpm): 69 Weight (lbs): 102 Respiratory Rate (breaths/min): 16 Body Mass Index (BMI): 19.9 Blood Pressure (mmHg): 150/70 Reference Range: 80 - 120 mg / dl Electronic Signature(s) Signed: 04/08/2022 4:34:16 PM By: Adline Peals Entered By: Adline Peals on 04/08/2022 09:53:53

## 2022-04-08 NOTE — Progress Notes (Addendum)
[name], [name] ([id number]) Visit Report for 04/08/2022 Chief Complaint Document Details Patient Name: Date of Service: [name] 04/08/2022 10:00 [name] Record Number: [id number] Patient Account Number: [id number] Date of Birth/Sex: Treating RN: [birth year] (81 y.o. [name] Primary Care Provider: Marton Redwood Other Clinician: Referring Provider: Treating Provider/Extender: Bethena Roys in Treatment: 4 Information Obtained from: Patient Chief Complaint Patient seen for complaints of Non-Healing Wound. Electronic Signature(s) Signed: 04/08/2022 10:10:06 AM By: Fredirick Maudlin MD FACS Entered By: Fredirick Maudlin on 04/08/2022 10:10:05 -------------------------------------------------------------------------------- HPI Details Patient Name: Date of Service: [name]. 04/08/2022 10:00 [name] Record Number: [id number] Patient Account Number: [id number] Date of Birth/Sex: Treating RN: [birth year] (81 y.o. [name] Primary Care Provider: Marton Redwood Other Clinician: Referring Provider: Treating Provider/Extender: Bethena Roys in Treatment: 4 History of Present Illness HPI Description: ADMISSION 03/11/2022 This is an 81 year old woman with [name] past medical history notable for carotid artery disease, pulmonary hypertension, varicose veins, multiple orthopedic surgical procedures, and [name] perimembranous VSD who presents today with [name] nonhealing wound on her left anterior tibial surface. It occurred about 2 months ago when [name] vacuum cleaner implement fell from the closet and struck her in the leg. This resulted in [name] hematoma. Due to her multiple orthopedic procedures, she initially was seen at [facility]. They performed an x-ray that was negative and apparently basically told her that it would take some time to resolve. I am not entirely sure how the hematoma ended up being evacuated but  she says that her primary care doctor put her on doxycycline and has had her applying mupirocin with Prisma silver collagen to the site. She says in the past 7 days, since that treatment was initiated, she has noted significant improvement. It does not seem to have much drainage nor is there any odor. The periwound skin is in good condition. 03/18/2022: The wound is smaller today. There is just [name] bit of slough accumulation on the surface. We have been using mupirocin and Hydrofera Blue. 03/25/2022: The wound has contracted quite [name] bit today. There is no significant slough accumulation and there is [name] nice bit of granulation tissue. We have been using mupirocin with Hydrofera Blue and 3 layer compression. She asked if she could wear her compression hose instead of the 3 layer. 04/01/2022: The wound continues to contract. There is no slough and the surface is healthy with good granulation tissue. We have been using mupirocin with Hydrofera Blue and 3 layer compression. 04/08/2022: The wound is about the same size today but has [name] clean surface with good granulation tissue. We have been using mupirocin under Hydrofera Blue and 3 layer compression. She asked again today if she could wear some other compression stockings rather than our wrap. Electronic Signature(s) Signed: 04/08/2022 10:10:53 AM By: Fredirick Maudlin MD FACS Entered By: Fredirick Maudlin on 04/08/2022 10:10:52 -------------------------------------------------------------------------------- Physical Exam Details Patient Name: Date of Service: [name]. 04/08/2022 10:00 [name] Medical Record Number: [id number] Patient Account Number: [id number] Date of Birth/Sex: Treating RN: December 24, 1940 (81 y.o. [name] Primary Care Provider: Marton Redwood Other Clinician: Referring Provider: Treating Provider/Extender: Bethena Roys in Treatment: 4 Constitutional Slightly hypertensive. . . . No acute  distress. Respiratory Normal work of breathing on room air. Notes 04/08/2022: The wound is about the same size today. It is very clean and has [name] good base of granulation  tissue. Electronic Signature(s) Signed: 04/08/2022 10:12:04 AM By: Fredirick Maudlin MD FACS Entered By: Fredirick Maudlin on 04/08/2022 10:12:03 -------------------------------------------------------------------------------- Physician Orders Details Patient Name: Date of Service: [name]. 04/08/2022 10:00 [name] Record Number: [id number] Patient Account Number: [id number] Date of Birth/Sex: Treating RN: 01/03/1941 (81 y.o. [name] Primary Care Provider: Marton Redwood Other Clinician: Referring Provider: Treating Provider/Extender: Bethena Roys in Treatment: 4 Verbal / Phone Orders: No Diagnosis Coding Follow-up Appointments ppointment in 1 week. - Dr Celine Ahr Room 2 - 6/2 at 7:45 AM Return [name] Bathing/ Shower/ Hygiene May shower and wash wound with soap and water. - before dressing changes Wound Treatment Wound #1 - Lower Leg Wound Laterality: Left, Medial Cleanser: Soap and Water Every Other Day/30 Days Discharge Instructions: May shower and wash wound with dial antibacterial soap and water prior to dressing change. Cleanser: Wound Cleanser Every Other Day/30 Days Discharge Instructions: Cleanse the wound with wound cleanser prior to applying [name] clean dressing using gauze sponges, not tissue or cotton balls. Cleanser: [place] Ancillary Kit - 15 Day Supply (DME) (Generic) Every Other Day/30 Days Discharge Instructions: Use supplies as instructed; Kit contains: (15) Saline Bullets; (15) 3x3 Gauze; 15 pr Gloves Prim Dressing: Promogran Prisma Matrix, 4.34 (sq in) (silver collagen) (DME) (Generic) Every Other Day/30 Days ary Discharge Instructions: Moisten collagen with saline or hydrogel Secondary Dressing: Bordered Gauze, 2x2 in (DME) (Generic) Every Other Day/30  Days Discharge Instructions: Apply over primary dressing as directed. Electronic Signature(s) Signed: 04/08/2022 12:53:30 PM By: Fredirick Maudlin MD FACS Signed: 04/08/2022 4:34:16 PM By: Adline Peals Previous Signature: 04/08/2022 10:12:10 AM Version By: Fredirick Maudlin MD FACS Entered By: Adline Peals on 04/08/2022 10:13:36 -------------------------------------------------------------------------------- Problem List Details Patient Name: Date of Service: DEA TO Herma Carson K. 04/08/2022 10:00 [name] Medical Record Number: [id number] Patient Account Number: [id number] Date of Birth/Sex: Treating RN: [birth year] (81 y.o. [name] Primary Care Provider: Marton Redwood Other Clinician: Referring Provider: Treating Provider/Extender: Bethena Roys in Treatment: 4 Active Problems ICD-10 Encounter Code Description Active Date MDM Diagnosis [phone] Non-pressure chronic ulcer of other part of left lower leg with fat layer exposed4/28/2023 No Yes I83.93 Asymptomatic varicose veins of bilateral lower extremities 03/11/2022 No Yes I27.20 Pulmonary hypertension, unspecified 03/11/2022 No Yes I77.9 Disorder of arteries and arterioles, unspecified 03/11/2022 No Yes Q21.0 Ventricular septal defect 03/11/2022 No Yes Inactive Problems Resolved Problems Electronic Signature(s) Signed: 04/08/2022 10:09:39 AM By: Fredirick Maudlin MD FACS Entered By: Fredirick Maudlin on 04/08/2022 10:09:38 -------------------------------------------------------------------------------- Progress Note Details Patient Name: Date of Service: [name]. 04/08/2022 10:00 [place] Record Number: [id number] Patient Account Number: [id number] Date of Birth/Sex: Treating RN: [birth year] (81 y.o. [name] Primary Care Provider: Marton Redwood Other Clinician: Referring Provider: Treating Provider/Extender: Bethena Roys in Treatment:  4 Subjective Chief Complaint Information obtained from Patient Patient seen for complaints of Non-Healing Wound. History of Present Illness (HPI) ADMISSION 03/11/2022 This is an 81 year old woman with [name] past medical history notable for carotid artery disease, pulmonary hypertension, varicose veins, multiple orthopedic surgical procedures, and [name] perimembranous VSD who presents today with [name] nonhealing wound on her left anterior tibial surface. It occurred about 2 months ago when [name] vacuum cleaner implement fell from the closet and struck her in the leg. This resulted in [name] hematoma. Due to her multiple orthopedic procedures, she initially was seen at [facility]. They performed an x-ray that was  negative and apparently basically told her that it would take some time to resolve. I am not entirely sure how the hematoma ended up being evacuated but she says that her primary care doctor put her on doxycycline and has had her applying mupirocin with Prisma silver collagen to the site. She says in the past 7 days, since that treatment was initiated, she has noted significant improvement. It does not seem to have much drainage nor is there any odor. The periwound skin is in good condition. 03/18/2022: The wound is smaller today. There is just [name] bit of slough accumulation on the surface. We have been using mupirocin and Hydrofera Blue. 03/25/2022: The wound has contracted quite [name] bit today. There is no significant slough accumulation and there is [name] nice bit of granulation tissue. We have been using mupirocin with Hydrofera Blue and 3 layer compression. She asked if she could wear her compression hose instead of the 3 layer. 04/01/2022: The wound continues to contract. There is no slough and the surface is healthy with good granulation tissue. We have been using mupirocin with Hydrofera Blue and 3 layer compression. 04/08/2022: The wound is about the same size today but has [name] clean surface with good granulation  tissue. We have been using mupirocin under Hydrofera Blue and 3 layer compression. She asked again today if she could wear some other compression stockings rather than our wrap. Patient History Information obtained from Patient. Family History Unknown History. Social History Never smoker, Marital Status - Married, Alcohol Use - Rarely, Drug Use - No History, Caffeine Use - Daily - coffee. Medical History Eyes Patient has history of Cataracts - Had cataracts removed Cardiovascular Patient has history of Hypertension Hospitalization/Surgery History - Bilateral Joint Replacements; Bilateral foot surgery (as per pt.). Medical [name] Surgical History Notes nd Respiratory Pulmonary Hypertension Cardiovascular Loop Recorder Cryptogenic Stroke-status post ILR Dyslipidemia Small perimembranous Ventricular Septal Defect Heart Murmur Objective Constitutional Slightly hypertensive. No acute distress. Vitals Time Taken: 9:53 AM, Height: 60 in, Weight: 102 lbs, BMI: 19.9, Temperature: 98.4 F, Pulse: 69 bpm, Respiratory Rate: 16 breaths/min, Blood Pressure: 150/70 mmHg. Respiratory Normal work of breathing on room air. General Notes: 04/08/2022: The wound is about the same size today. It is very clean and has [name] good base of granulation tissue. Integumentary (Hair, Skin) Wound #1 status is Open. Original cause of wound was Trauma. The date acquired was: 01/07/2022. The wound has been in treatment 4 weeks. The wound is located on the Left,Medial Lower Leg. The wound measures 0.7cm length x 0.6cm width x 0.1cm depth; 0.33cm^2 area and 0.033cm^3 volume. There is Fat Layer (Subcutaneous Tissue) exposed. There is no tunneling or undermining noted. There is [name] medium amount of serosanguineous drainage noted. There is large (67-100%) red granulation within the wound bed. There is no necrotic tissue within the wound bed. Assessment Active Problems ICD-10 Non-pressure chronic ulcer of other part of left lower  leg with fat layer exposed Asymptomatic varicose veins of bilateral lower extremities Pulmonary hypertension, unspecified Disorder of arteries and arterioles, unspecified Ventricular septal defect Plan 04/08/2022: The wound is about the same size today. It is very clean and has [name] good base of granulation tissue. No debridement was necessary today. I think we can discontinue use of the mupirocin. I am going to change her dressing to Prisma silver collagen to see if we can increase the rate of wound closure. I agreed to let her try wearing her own compression stockings this week and see how the  wound responds. Follow-up in 1 week's time. Electronic Signature(s) Signed: 04/08/2022 10:12:52 AM By: Fredirick Maudlin MD FACS Entered By: Fredirick Maudlin on 04/08/2022 10:12:52 -------------------------------------------------------------------------------- HxROS Details Patient Name: Date of Service: [name]. 04/08/2022 10:00 [name] Medical Record Number: [id number] Patient Account Number: [id number] Date of Birth/Sex: Treating RN: [birth year] (81 y.o. 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 HBO Extended History Items Eyes: Cataracts Immunizations Pneumococcal Vaccine: Received Pneumococcal Vaccination: Yes Received Pneumococcal Vaccination On or After 60th Birthday: Yes Implantable Devices No devices added Hospitalization / Surgery History Type  of Hospitalization/Surgery Bilateral Joint Replacements; Bilateral foot surgery (as per pt.) Family and Social History Unknown History: Yes; Never smoker; Marital Status - Married; Alcohol Use: Rarely; Drug Use: No History; Caffeine Use: Daily - coffee; Financial Concerns: No; Food, Clothing or Shelter Needs: No; Support System Lacking: No; Transportation Concerns: No Electronic Signature(s) Signed: 04/08/2022 12:53:30 PM By: Fredirick Maudlin MD FACS Signed: 04/08/2022 4:34:16 PM By: Adline Peals Entered By: Fredirick Maudlin on 04/08/2022 10:11:22 -------------------------------------------------------------------------------- SuperBill Details Patient Name: Date of Service: [name] 04/08/2022 Medical Record Number: [id number] Patient Account Number: [id number] Date of Birth/Sex: Treating RN: May 20, 1941 (81 y.o. [name] Primary Care Provider: Marton Redwood Other Clinician: Referring Provider: Treating Provider/Extender: Bethena Roys in Treatment: 4 Diagnosis Coding ICD-10 Codes Code Description [phone] Non-pressure chronic ulcer of other part of left lower leg with fat layer exposed I83.93 Asymptomatic varicose veins of bilateral lower extremities I27.20 Pulmonary hypertension, unspecified I77.9 Disorder of arteries and arterioles, unspecified Q21.0 Ventricular septal defect Facility Procedures CPT4 Code: [id number] Description: 99213 - WOUND CARE VISIT-LEV 3 EST PT Modifier: Quantity: 1 Physician Procedures : CPT4 Code Description Modifier [id number] - WC PHYS LEVEL 3 - EST PT ICD-10 Diagnosis Description L97.822 Non-pressure chronic ulcer of other part of left lower leg with fat layer exposed I83.93 Asymptomatic varicose veins of bilateral lower  extremities I27.20 Pulmonary hypertension, unspecified I77.9 Disorder of arteries and arterioles, unspecified Quantity: 1 Electronic Signature(s) Signed: 04/08/2022 12:53:30 PM  By: Fredirick Maudlin MD FACS Signed: 04/08/2022 4:34:16 PM By: Adline Peals Previous Signature: 04/08/2022 10:13:25 AM Version By: Fredirick Maudlin MD FACS Entered By: Adline Peals on 04/08/2022 10:25:04

## 2022-04-12 DIAGNOSIS — L97929 Non-pressure chronic ulcer of unspecified part of left lower leg with unspecified severity: Secondary | ICD-10-CM | POA: Diagnosis not present

## 2022-04-15 ENCOUNTER — Encounter (HOSPITAL_BASED_OUTPATIENT_CLINIC_OR_DEPARTMENT_OTHER): Payer: PPO | Attending: General Surgery | Admitting: General Surgery

## 2022-04-15 DIAGNOSIS — I272 Pulmonary hypertension, unspecified: Secondary | ICD-10-CM | POA: Diagnosis not present

## 2022-04-15 DIAGNOSIS — I779 Disorder of arteries and arterioles, unspecified: Secondary | ICD-10-CM | POA: Insufficient documentation

## 2022-04-15 DIAGNOSIS — Q210 Ventricular septal defect: Secondary | ICD-10-CM | POA: Insufficient documentation

## 2022-04-15 DIAGNOSIS — L97822 Non-pressure chronic ulcer of other part of left lower leg with fat layer exposed: Secondary | ICD-10-CM | POA: Diagnosis not present

## 2022-04-15 DIAGNOSIS — I8393 Asymptomatic varicose veins of bilateral lower extremities: Secondary | ICD-10-CM | POA: Diagnosis not present

## 2022-04-15 NOTE — Progress Notes (Signed)
MASSIEL, STIPP ([id number]) Visit Report for 04/15/2022 Chief Complaint Document Details Patient Name: Date of Service: [name] 04/15/2022 7:45 A M Medical Record Number: [id number] Patient Account Number: [id number] Date of Birth/Sex: Treating RN: 01/23/1941 (81 y.o. F) Primary Care Provider: Marton Redwood Other Clinician: Referring Provider: Treating Provider/Extender: Bethena Roys in Treatment: 5 Information Obtained from: Patient Chief Complaint Patient seen for complaints of Non-Healing Wound. Electronic Signature(s) Signed: 04/15/2022 8:34:53 AM By: Fredirick Maudlin MD FACS Entered By: Fredirick Maudlin on 04/15/2022 08:34:53 -------------------------------------------------------------------------------- HPI Details Patient Name: Date of Service: [name]. 04/15/2022 7:45 A M Medical Record Number: [id number] Patient Account Number: [id number] Date of Birth/Sex: Treating RN: 1940/12/29 (81 y.o. F) Primary Care Provider: Marton Redwood Other Clinician: Referring Provider: Treating Provider/Extender: Bethena Roys in Treatment: 5 History of Present Illness HPI Description: ADMISSION 03/11/2022 This is an 81 year old woman with a past medical history notable for carotid artery disease, pulmonary hypertension, varicose veins, multiple orthopedic surgical procedures, and a perimembranous VSD who presents today with a nonhealing wound on her left anterior tibial surface. It occurred about 2 months ago when a vacuum cleaner implement fell from the closet and struck her in the leg. This resulted in a hematoma. Due to her multiple orthopedic procedures, she initially was seen at [facility]. They performed an x-ray that was negative and apparently basically told her that it would take some time to resolve. I am not entirely sure how the hematoma ended up being evacuated but she says that her primary care doctor put her  on doxycycline and has had her applying mupirocin with Prisma silver collagen to the site. She says in the past 7 days, since that treatment was initiated, she has noted significant improvement. It does not seem to have much drainage nor is there any odor. The periwound skin is in good condition. 03/18/2022: The wound is smaller today. There is just a bit of slough accumulation on the surface. We have been using mupirocin and Hydrofera Blue. 03/25/2022: The wound has contracted quite a bit today. There is no significant slough accumulation and there is a nice bit of granulation tissue. We have been using mupirocin with Hydrofera Blue and 3 layer compression. She asked if she could wear her compression hose instead of the 3 layer. 04/01/2022: The wound continues to contract. There is no slough and the surface is healthy with good granulation tissue. We have been using mupirocin with Hydrofera Blue and 3 layer compression. 04/08/2022: The wound is about the same size today but has a clean surface with good granulation tissue. We have been using mupirocin under Hydrofera Blue and 3 layer compression. She asked again today if she could wear some other compression stockings rather than our wrap. 04/15/2022: The wound is quite a bit smaller today. Last week, I allowed her to wear her own compression stocking and just applied a foam border dressing over the Prisma. It is clean without any slough accumulation in the periwound skin is in excellent condition. Electronic Signature(s) Signed: 04/15/2022 8:35:32 AM By: Fredirick Maudlin MD FACS Entered By: Fredirick Maudlin on 04/15/2022 08:35:31 -------------------------------------------------------------------------------- Physical Exam Details Patient Name: Date of Service: [name]. 04/15/2022 7:45 A M Medical Record Number: [id number] Patient Account Number: [id number] Date of Birth/Sex: Treating RN: 25-Aug-1941 (81 y.o. F) Primary Care Provider: Marton Redwood Other Clinician: Referring Provider: Treating Provider/Extender: Bethena Roys in Treatment:  5 Constitutional She is hypertensive, but asymptomatic.. . . . No acute distress. Respiratory Normal work of breathing on room air. Notes 04/15/2022: The wound is quite a bit smaller today. It is just a couple of millimeters and has a good surface of granulation tissue. Periwound skin is in excellent condition and there is no concern for infection. Electronic Signature(s) Signed: 04/15/2022 8:36:55 AM By: Fredirick Maudlin MD FACS Entered By: Fredirick Maudlin on 04/15/2022 08:36:55 -------------------------------------------------------------------------------- Physician Orders Details Patient Name: Date of Service: [name]. 04/15/2022 7:45 A M Medical Record Number: [id number] Patient Account Number: [id number] Date of Birth/Sex: Treating RN: [birth year] (81 y.o. [name] Primary Care Provider: Marton Redwood Other Clinician: Referring Provider: Treating Provider/Extender: Bethena Roys in Treatment: 5 Verbal / Phone Orders: No Diagnosis Coding ICD-10 Coding Code Description [phone] Non-pressure chronic ulcer of other part of left lower leg with fat layer exposed I83.93 Asymptomatic varicose veins of bilateral lower extremities I27.20 Pulmonary hypertension, unspecified I77.9 Disorder of arteries and arterioles, unspecified Q21.0 Ventricular septal defect Follow-up Appointments ppointment in 1 week. - Dr Celine Ahr Room 2 - 6/9 at 7:45 AM Return A Bathing/ Shower/ Hygiene May shower and wash wound with soap and water. - before dressing changes Wound Treatment Wound #1 - Lower Leg Wound Laterality: Left, Medial Cleanser: Soap and Water Every Other Day/30 Days Discharge Instructions: May shower and wash wound with dial antibacterial soap and water prior to dressing change. Cleanser: Wound Cleanser Every Other Day/30  Days Discharge Instructions: Cleanse the wound with wound cleanser prior to applying a clean dressing using gauze sponges, not tissue or cotton balls. Prim Dressing: Promogran Prisma Matrix, 4.34 (sq in) (silver collagen) (Generic) Every Other Day/30 Days ary Discharge Instructions: Moisten collagen with saline or hydrogel Secondary Dressing: Bordered Gauze, 2x2 in (Generic) Every Other Day/30 Days Discharge Instructions: Apply over primary dressing as directed. Electronic Signature(s) Signed: 04/15/2022 8:37:04 AM By: Fredirick Maudlin MD FACS Entered By: Fredirick Maudlin on 04/15/2022 08:37:04 -------------------------------------------------------------------------------- Problem List Details Patient Name: Date of Service: [name]. 04/15/2022 7:45 A M Medical Record Number: [id number] Patient Account Number: [id number] Date of Birth/Sex: Treating RN: 20-Oct-1941 (81 y.o. F) Primary Care Provider: Marton Redwood Other Clinician: Referring Provider: Treating Provider/Extender: Bethena Roys in Treatment: 5 Active Problems ICD-10 Encounter Code Description Active Date MDM Diagnosis [phone] Non-pressure chronic ulcer of other part of left lower leg with fat layer exposed4/28/2023 No Yes I83.93 Asymptomatic varicose veins of bilateral lower extremities 03/11/2022 No Yes I27.20 Pulmonary hypertension, unspecified 03/11/2022 No Yes I77.9 Disorder of arteries and arterioles, unspecified 03/11/2022 No Yes Q21.0 Ventricular septal defect 03/11/2022 No Yes Inactive Problems Resolved Problems Electronic Signature(s) Signed: 04/15/2022 8:33:47 AM By: Fredirick Maudlin MD FACS Entered By: Fredirick Maudlin on 04/15/2022 08:33:47 -------------------------------------------------------------------------------- Progress Note Details Patient Name: Date of Service: [name]. 04/15/2022 7:45 A M Medical Record Number: [id number] Patient Account Number:  [id number] Date of Birth/Sex: Treating RN: 07-05-1941 (81 y.o. F) Primary Care Provider: Marton Redwood Other Clinician: Referring Provider: Treating Provider/Extender: Bethena Roys in Treatment: 5 Subjective Chief Complaint Information obtained from Patient Patient seen for complaints of Non-Healing Wound. History of Present Illness (HPI) ADMISSION 03/11/2022 This is an 81 year old woman with a past medical history notable for carotid artery disease, pulmonary hypertension, varicose veins, multiple orthopedic surgical procedures, and a perimembranous VSD who presents today with a nonhealing wound on her left anterior tibial surface.  It occurred about 2 months ago when a vacuum cleaner implement fell from the closet and struck her in the leg. This resulted in a hematoma. Due to her multiple orthopedic procedures, she initially was seen at [facility]. They performed an x-ray that was negative and apparently basically told her that it would take some time to resolve. I am not entirely sure how the hematoma ended up being evacuated but she says that her primary care doctor put her on doxycycline and has had her applying mupirocin with Prisma silver collagen to the site. She says in the past 7 days, since that treatment was initiated, she has noted significant improvement. It does not seem to have much drainage nor is there any odor. The periwound skin is in good condition. 03/18/2022: The wound is smaller today. There is just a bit of slough accumulation on the surface. We have been using mupirocin and Hydrofera Blue. 03/25/2022: The wound has contracted quite a bit today. There is no significant slough accumulation and there is a nice bit of granulation tissue. We have been using mupirocin with Hydrofera Blue and 3 layer compression. She asked if she could wear her compression hose instead of the 3 layer. 04/01/2022: The wound continues to contract. There is no slough and  the surface is healthy with good granulation tissue. We have been using mupirocin with Hydrofera Blue and 3 layer compression. 04/08/2022: The wound is about the same size today but has a clean surface with good granulation tissue. We have been using mupirocin under Hydrofera Blue and 3 layer compression. She asked again today if she could wear some other compression stockings rather than our wrap. 04/15/2022: The wound is quite a bit smaller today. Last week, I allowed her to wear her own compression stocking and just applied a foam border dressing over the Prisma. It is clean without any slough accumulation in the periwound skin is in excellent condition. Patient History Information obtained from Patient. Family History Unknown History. Social History Never smoker, Marital Status - Married, Alcohol Use - Rarely, Drug Use - No History, Caffeine Use - Daily - coffee. Medical History Eyes Patient has history of Cataracts - Had cataracts removed Cardiovascular Patient has history of Hypertension Hospitalization/Surgery History - Bilateral Joint Replacements; Bilateral foot surgery (as per pt.). Medical A Surgical History Notes nd Respiratory Pulmonary Hypertension Cardiovascular Loop Recorder Cryptogenic Stroke-status post ILR Dyslipidemia Small perimembranous Ventricular Septal Defect Heart Murmur Objective Constitutional She is hypertensive, but asymptomatic.Marland Kitchen No acute distress. Vitals Time Taken: 7:53 AM, Height: 60 in, Weight: 102 lbs, BMI: 19.9, Temperature: 98.5 F, Pulse: 72 bpm, Respiratory Rate: 16 breaths/min, Blood Pressure: 158/72 mmHg. Respiratory Normal work of breathing on room air. General Notes: 04/15/2022: The wound is quite a bit smaller today. It is just a couple of millimeters and has a good surface of granulation tissue. Periwound skin is in excellent condition and there is no concern for infection. Integumentary (Hair, Skin) Wound #1 status is Open. Original cause  of wound was Trauma. The date acquired was: 01/07/2022. The wound has been in treatment 5 weeks. The wound is located on the Left,Medial Lower Leg. The wound measures 0.5cm length x 0.4cm width x 0.1cm depth; 0.157cm^2 area and 0.016cm^3 volume. There is Fat Layer (Subcutaneous Tissue) exposed. There is no tunneling or undermining noted. There is a medium amount of serosanguineous drainage noted. The wound margin is distinct with the outline attached to the wound base. There is large (67-100%) red granulation within the wound  bed. There is no necrotic tissue within the wound bed. Assessment Active Problems ICD-10 Non-pressure chronic ulcer of other part of left lower leg with fat layer exposed Asymptomatic varicose veins of bilateral lower extremities Pulmonary hypertension, unspecified Disorder of arteries and arterioles, unspecified Ventricular septal defect Plan Follow-up Appointments: Return Appointment in 1 week. - Dr Celine Ahr Room 2 - 6/9 at 7:45 AM Bathing/ Shower/ Hygiene: May shower and wash wound with soap and water. - before dressing changes WOUND #1: - Lower Leg Wound Laterality: Left, Medial Cleanser: Soap and Water Every Other Day/30 Days Discharge Instructions: May shower and wash wound with dial antibacterial soap and water prior to dressing change. Cleanser: Wound Cleanser Every Other Day/30 Days Discharge Instructions: Cleanse the wound with wound cleanser prior to applying a clean dressing using gauze sponges, not tissue or cotton balls. Prim Dressing: Promogran Prisma Matrix, 4.34 (sq in) (silver collagen) (Generic) Every Other Day/30 Days ary Discharge Instructions: Moisten collagen with saline or hydrogel Secondary Dressing: Bordered Gauze, 2x2 in (Generic) Every Other Day/30 Days Discharge Instructions: Apply over primary dressing as directed. 04/15/2022: The wound is quite a bit smaller today. It is just a couple of millimeters and has a good surface of granulation  tissue. Periwound skin is in excellent condition and there is no concern for infection. No debridement was necessary today. We will continue using the Prisma silver collagen with a foam border dressing. The patient will wear her own compression stockings. Follow-up in 1 week. Electronic Signature(s) Signed: 04/15/2022 8:37:27 AM By: Fredirick Maudlin MD FACS Entered By: Fredirick Maudlin on 04/15/2022 08:37:27 -------------------------------------------------------------------------------- HxROS Details Patient Name: Date of Service: [name]. 04/15/2022 7:45 A M Medical Record Number: [id number] Patient Account Number: [id number] Date of Birth/Sex: Treating RN: 1941-01-03 (81 y.o. F) Primary Care Provider: Marton Redwood Other Clinician: Referring Provider: Treating Provider/Extender: Bethena Roys in Treatment: 5 Information Obtained From Patient Eyes Medical History: Positive for: Cataracts - Had cataracts removed Respiratory Medical History: Past Medical History Notes: Pulmonary Hypertension Cardiovascular Medical History: Positive for: Hypertension Past Medical History Notes: Loop Recorder Cryptogenic Stroke-status post ILR Dyslipidemia Small perimembranous Ventricular Septal Defect Heart Murmur HBO Extended History Items Eyes: Cataracts Immunizations Pneumococcal Vaccine: Received Pneumococcal Vaccination: Yes Received Pneumococcal Vaccination On or After 60th Birthday: Yes Implantable Devices No devices added Hospitalization / Surgery History Type of Hospitalization/Surgery Bilateral Joint Replacements; Bilateral foot surgery (as per pt.) Family and Social History Unknown History: Yes; Never smoker; Marital Status - Married; Alcohol Use: Rarely; Drug Use: No History; Caffeine Use: Daily - coffee; Financial Concerns: No; Food, Clothing or Shelter Needs: No; Support System Lacking: No; Transportation Concerns: No Electronic  Signature(s) Signed: 04/15/2022 9:37:46 AM By: Fredirick Maudlin MD FACS Entered By: Fredirick Maudlin on 04/15/2022 08:35:36 -------------------------------------------------------------------------------- SuperBill Details Patient Name: Date of Service: [name] 04/15/2022 Medical Record Number: [id number] Patient Account Number: [id number] Date of Birth/Sex: Treating RN: Jun 24, 1941 (81 y.o. F) Primary Care Provider: Marton Redwood Other Clinician: Referring Provider: Treating Provider/Extender: Bethena Roys in Treatment: 5 Diagnosis Coding ICD-10 Codes Code Description [phone] Non-pressure chronic ulcer of other part of left lower leg with fat layer exposed I83.93 Asymptomatic varicose veins of bilateral lower extremities I27.20 Pulmonary hypertension, unspecified I77.9 Disorder of arteries and arterioles, unspecified Q21.0 Ventricular septal defect Facility Procedures Physician Procedures : CPT4 Code Description Modifier [id number] - WC PHYS LEVEL 3 - EST PT ICD-10 Diagnosis Description L97.822 Non-pressure chronic ulcer of other  part of left lower leg with fat layer exposed I83.93 Asymptomatic varicose veins of bilateral lower  extremities I77.9 Disorder of arteries and arterioles, unspecified I27.20 Pulmonary hypertension, unspecified Quantity: 1 Electronic Signature(s) Signed: 04/15/2022 9:37:46 AM By: Fredirick Maudlin MD FACS Signed: 04/15/2022 5:10:34 PM By: Adline Peals Previous Signature: 04/15/2022 8:38:32 AM Version By: Fredirick Maudlin MD FACS Entered By: Adline Peals on 04/15/2022 08:47:43

## 2022-04-15 NOTE — Progress Notes (Signed)
[name], [name] ([id number]) Visit Report for 04/15/2022 Arrival Information Details Patient Name: Date of Service: [name] 04/15/2022 7:45 A M Medical Record Number: [id number] Patient Account Number: [id number] Date of Birth/Sex: Treating RN: [birth year] (81 y.o. [name] Primary Care Ilia Dimaano: Marton Redwood Other Clinician: Referring Renai Lopata: Treating Kielan Dreisbach/Extender: Bethena Roys in Treatment: 5 Visit Information History Since Last Visit Added or deleted any medications: No Patient Arrived: Ambulatory Any new allergies or adverse reactions: No Arrival Time: 07:48 Had a fall or experienced change in No Accompanied By: self activities of daily living that may affect Transfer Assistance: None risk of falls: Patient Identification Verified: Yes Signs or symptoms of abuse/neglect since last visito No Secondary Verification Process Completed: Yes Hospitalized since last visit: No Patient Requires Transmission-Based Precautions: No Implantable device outside of the clinic excluding No Patient Has Alerts: Yes cellular tissue based products placed in the center Patient Alerts: Patient on Blood Thinner since last visit: Has Dressing in Place as Prescribed: No Pain Present Now: No Electronic Signature(s) Signed: 04/15/2022 5:10:34 PM By: Adline Peals Entered By: Adline Peals on 04/15/2022 07:53:41 -------------------------------------------------------------------------------- Clinic Level of Care Assessment Details Patient Name: Date of Service: [name] 04/15/2022 7:45 A M Medical Record Number: [id number] Patient Account Number: [id number] Date of Birth/Sex: Treating RN: 1941-10-26 (81 y.o. [name] Primary Care Gunner Iodice: Marton Redwood Other Clinician: Referring Letina Luckett: Treating Cariann Kinnamon/Extender: Bethena Roys in Treatment: 5 Clinic Level of Care Assessment Items TOOL  4 Quantity Score X- 1 0 Use when only an EandM is performed on FOLLOW-UP visit ASSESSMENTS - Nursing Assessment / Reassessment X- 1 10 Reassessment of Co-morbidities (includes updates in patient status) X- 1 5 Reassessment of Adherence to Treatment Plan ASSESSMENTS - Wound and Skin A ssessment / Reassessment X - Simple Wound Assessment / Reassessment - one wound 1 5 '[]'$  - 0 Complex Wound Assessment / Reassessment - multiple wounds '[]'$  - 0 Dermatologic / Skin Assessment (not related to wound area) ASSESSMENTS - Focused Assessment X- 1 5 Circumferential Edema Measurements - multi extremities '[]'$  - 0 Nutritional Assessment / Counseling / Intervention X- 1 5 Lower Extremity Assessment (monofilament, tuning fork, pulses) '[]'$  - 0 Peripheral Arterial Disease Assessment (using hand held doppler) ASSESSMENTS - Ostomy and/or Continence Assessment and Care '[]'$  - 0 Incontinence Assessment and Management '[]'$  - 0 Ostomy Care Assessment and Management (repouching, etc.) PROCESS - Coordination of Care X - Simple Patient / Family Education for ongoing care 1 15 '[]'$  - 0 Complex (extensive) Patient / Family Education for ongoing care X- 1 10 Staff obtains Programmer, systems, Records, T Results / Process Orders est '[]'$  - 0 Staff telephones HHA, Nursing Homes / Clarify orders / etc '[]'$  - 0 Routine Transfer to another Facility (non-emergent condition) '[]'$  - 0 Routine Hospital Admission (non-emergent condition) '[]'$  - 0 New Admissions / Biomedical engineer / Ordering NPWT Apligraf, etc. , '[]'$  - 0 Emergency Hospital Admission (emergent condition) X- 1 10 Simple Discharge Coordination '[]'$  - 0 Complex (extensive) Discharge Coordination PROCESS - Special Needs '[]'$  - 0 Pediatric / Minor Patient Management '[]'$  - 0 Isolation Patient Management '[]'$  - 0 Hearing / Language / Visual special needs '[]'$  - 0 Assessment of Community assistance (transportation, D/C planning, etc.) '[]'$  - 0 Additional assistance / Altered  mentation '[]'$  - 0 Support Surface(s) Assessment (bed, cushion, seat, etc.) INTERVENTIONS - Wound Cleansing / Measurement X - Simple Wound Cleansing - one wound 1  5 '[]'$  - 0 Complex Wound Cleansing - multiple wounds X- 1 5 Wound Imaging (photographs - any number of wounds) '[]'$  - 0 Wound Tracing (instead of photographs) X- 1 5 Simple Wound Measurement - one wound '[]'$  - 0 Complex Wound Measurement - multiple wounds INTERVENTIONS - Wound Dressings X - Small Wound Dressing one or multiple wounds 1 10 '[]'$  - 0 Medium Wound Dressing one or multiple wounds '[]'$  - 0 Large Wound Dressing one or multiple wounds '[]'$  - 0 Application of Medications - topical '[]'$  - 0 Application of Medications - injection INTERVENTIONS - Miscellaneous '[]'$  - 0 External ear exam '[]'$  - 0 Specimen Collection (cultures, biopsies, blood, body fluids, etc.) '[]'$  - 0 Specimen(s) / Culture(s) sent or taken to Lab for analysis '[]'$  - 0 Patient Transfer (multiple staff / Civil Service fast streamer / Similar devices) '[]'$  - 0 Simple Staple / Suture removal (25 or less) '[]'$  - 0 Complex Staple / Suture removal (26 or more) '[]'$  - 0 Hypo / Hyperglycemic Management (close monitor of Blood Glucose) '[]'$  - 0 Ankle / Brachial Index (ABI) - do not check if billed separately X- 1 5 Vital Signs Has the patient been seen at the hospital within the last three years: Yes Total Score: 95 Level Of Care: New/Established - Level 3 Electronic Signature(s) Signed: 04/15/2022 5:10:34 PM By: Adline Peals Entered By: Adline Peals on 04/15/2022 08:47:36 -------------------------------------------------------------------------------- Encounter Discharge Information Details Patient Name: Date of Service: [name]. 04/15/2022 7:45 A M Medical Record Number: [id number] Patient Account Number: [id number] Date of Birth/Sex: Treating RN: August 24, 1941 (81 y.o. [name] Primary Care Beckie Viscardi: Marton Redwood Other Clinician: Referring  Brittinee Risk: Treating Kitt Ledet/Extender: Bethena Roys in Treatment: 5 Encounter Discharge Information Items Discharge Condition: Stable Ambulatory Status: Ambulatory Discharge Destination: Home Transportation: Private Auto Accompanied By: self Schedule Follow-up Appointment: Yes Clinical Summary of Care: Patient Declined Electronic Signature(s) Signed: 04/15/2022 5:10:34 PM By: Sabas Sous By: Adline Peals on 04/15/2022 08:14:10 -------------------------------------------------------------------------------- Lower Extremity Assessment Details Patient Name: Date of Service: [name] 04/15/2022 7:45 A M Medical Record Number: [id number] Patient Account Number: [id number] Date of Birth/Sex: Treating RN: 25-May-1941 (81 y.o. [name] Primary Care Pecolia Marando: Marton Redwood Other Clinician: Referring Gareld Obrecht: Treating Ramir Malerba/Extender: Bethena Roys in Treatment: 5 Edema Assessment Assessed: Shirlyn Goltz: No] [Right: No] Edema: [Left: N] [Right: o] Calf Left: Right: Point of Measurement: 29 cm From Medial Instep 31 cm Ankle Left: Right: Point of Measurement: 8 cm From Medial Instep 20 cm Vascular Assessment Pulses: Dorsalis Pedis Palpable: [Left:Yes] Electronic Signature(s) Signed: 04/15/2022 5:10:34 PM By: Adline Peals Entered By: Adline Peals on 04/15/2022 07:55:23 -------------------------------------------------------------------------------- Multi Wound Chart Details Patient Name: Date of Service: [name]. 04/15/2022 7:45 A M Medical Record Number: [id number] Patient Account Number: [id number] Date of Birth/Sex: Treating RN: Sep 08, 1941 (81 y.o. F) Primary Care Daishaun Ayre: Marton Redwood Other Clinician: Referring Micheala Morissette: Treating Kylyn Sookram/Extender: Bethena Roys in Treatment: 5 Vital Signs Height(in): 60 Pulse(bpm): 72 Weight(lbs): 102 Blood  Pressure(mmHg): 158/72 Body Mass Index(BMI): 19.9 Temperature(F): 98.5 Respiratory Rate(breaths/min): 16 Photos: [N/A:N/A] Left, Medial Lower Leg N/A N/A Wound Location: Trauma N/A N/A Wounding Event: Venous Leg Ulcer N/A N/A Primary Etiology: Cataracts, Hypertension N/A N/A Comorbid History: 01/07/2022 N/A N/A Date Acquired: 5 N/A N/A Weeks of Treatment: Open N/A N/A Wound Status: No N/A N/A Wound Recurrence: 0.5x0.4x0.1 N/A N/A Measurements L x W x D (cm) 0.157 N/A N/A  A (cm) : rea 0.016 N/A N/A Volume (cm) : 89.00% N/A N/A % Reduction in A rea: 94.40% N/A N/A % Reduction in Volume: Full Thickness Without Exposed N/A N/A Classification: Support Structures Medium N/A N/A Exudate Amount: Serosanguineous N/A N/A Exudate Type: red, brown N/A N/A Exudate Color: Distinct, outline attached N/A N/A Wound Margin: Large (67-100%) N/A N/A Granulation Amount: Red N/A N/A Granulation Quality: None Present (0%) N/A N/A Necrotic Amount: Fat Layer (Subcutaneous Tissue): Yes N/A N/A Exposed Structures: Fascia: No Tendon: No Muscle: No Joint: No Bone: No Medium (34-66%) N/A N/A Epithelialization: Treatment Notes Wound #1 (Lower Leg) Wound Laterality: Left, Medial Cleanser Soap and Water Discharge Instruction: May shower and wash wound with dial antibacterial soap and water prior to dressing change. Wound Cleanser Discharge Instruction: Cleanse the wound with wound cleanser prior to applying a clean dressing using gauze sponges, not tissue or cotton balls. Peri-Wound Care Topical Primary Dressing Promogran Prisma Matrix, 4.34 (sq in) (silver collagen) Discharge Instruction: Moisten collagen with saline or hydrogel Secondary Dressing Bordered Gauze, 2x2 in Discharge Instruction: Apply over primary dressing as directed. Secured With Compression Wrap Compression Stockings Environmental education officer) Signed: 04/15/2022 8:34:46 AM By: Fredirick Maudlin MD  FACS Entered By: Fredirick Maudlin on 04/15/2022 08:34:45 -------------------------------------------------------------------------------- Multi-Disciplinary Care Plan Details Patient Name: Date of Service: [name]. 04/15/2022 7:45 A M Medical Record Number: [id number] Patient Account Number: [id number] Date of Birth/Sex: Treating RN: 1940/12/01 (81 y.o. [name] Primary Care Torii Royse: Marton Redwood Other Clinician: Referring Aleanna Menge: Treating Dajon Lazar/Extender: Bethena Roys in Treatment: 5 Active Inactive Wound/Skin Impairment Nursing Diagnoses: Knowledge deficit related to ulceration/compromised skin integrity Goals: Patient/caregiver will verbalize understanding of skin care regimen Date Initiated: 03/11/2022 Target Resolution Date: 05/06/2022 Goal Status: Active Interventions: Assess ulceration(s) every visit Treatment Activities: Skin care regimen initiated : 03/11/2022 Notes: Electronic Signature(s) Signed: 04/15/2022 5:10:34 PM By: Adline Peals Entered By: Adline Peals on 04/15/2022 07:58:22 -------------------------------------------------------------------------------- Pain Assessment Details Patient Name: Date of Service: [name]. 04/15/2022 7:45 A M Medical Record Number: [id number] Patient Account Number: [id number] Date of Birth/Sex: Treating RN: 1941-03-22 (81 y.o. [name] Primary Care Vaughn Beaumier: Marton Redwood Other Clinician: Referring Izzy Doubek: Treating Marshon Bangs/Extender: Bethena Roys in Treatment: 5 Active Problems Location of Pain Severity and Description of Pain Patient Has Paino No Site Locations Rate the pain. Current Pain Level: 0 Pain Management and Medication Current Pain Management: Electronic Signature(s) Signed: 04/15/2022 5:10:34 PM By: Adline Peals Entered By: Adline Peals on 04/15/2022  07:54:58 -------------------------------------------------------------------------------- Patient/Caregiver Education Details Patient Name: Date of Service: [name] 6/2/2023andnbsp7:45 A M Medical Record Number: [id number] Patient Account Number: [id number] Date of Birth/Gender: Treating RN: 1941/06/13 (81 y.o. [name] Primary Care Physician: Marton Redwood Other Clinician: Referring Physician: Treating Physician/Extender: Bethena Roys in Treatment: 5 Education Assessment Education Provided To: Patient Education Topics Provided Wound/Skin Impairment: Methods: Explain/Verbal Responses: Reinforcements needed, State content correctly Electronic Signature(s) Signed: 04/15/2022 5:10:34 PM By: Sabas Sous By: Adline Peals on 04/15/2022 07:58:35 -------------------------------------------------------------------------------- Wound Assessment Details Patient Name: Date of Service: [name]. 04/15/2022 7:45 A M Medical Record Number: [id number] Patient Account Number: [id number] Date of Birth/Sex: Treating RN: 09/07/1941 (81 y.o. [name] Primary Care Shaterra Sanzone: Marton Redwood Other Clinician: Referring Eriyah Fernando: Treating Izaiha Lo/Extender: Bethena Roys in Treatment: 5 Wound Status Wound Number: 1 Primary Etiology: Venous Leg Ulcer Wound Location: Left,  Medial Lower Leg Wound Status: Open Wounding Event: Trauma Comorbid History: Cataracts, Hypertension Date Acquired: 01/07/2022 Weeks Of Treatment: 5 Clustered Wound: No Photos Wound Measurements Length: (cm) 0.5 Width: (cm) 0.4 Depth: (cm) 0.1 Area: (cm) 0.157 Volume: (cm) 0.016 % Reduction in Area: 89% % Reduction in Volume: 94.4% Epithelialization: Medium (34-66%) Tunneling: No Undermining: No Wound Description Classification: Full Thickness Without Exposed Support Structures Wound Margin: Distinct,  outline attached Exudate Amount: Medium Exudate Type: Serosanguineous Exudate Color: red, brown Foul Odor After Cleansing: No Slough/Fibrino No Wound Bed Granulation Amount: Large (67-100%) Exposed Structure Granulation Quality: Red Fascia Exposed: No Necrotic Amount: None Present (0%) Fat Layer (Subcutaneous Tissue) Exposed: Yes Tendon Exposed: No Muscle Exposed: No Joint Exposed: No Bone Exposed: No Treatment Notes Wound #1 (Lower Leg) Wound Laterality: Left, Medial Cleanser Soap and Water Discharge Instruction: May shower and wash wound with dial antibacterial soap and water prior to dressing change. Wound Cleanser Discharge Instruction: Cleanse the wound with wound cleanser prior to applying a clean dressing using gauze sponges, not tissue or cotton balls. Peri-Wound Care Topical Primary Dressing Promogran Prisma Matrix, 4.34 (sq in) (silver collagen) Discharge Instruction: Moisten collagen with saline or hydrogel Secondary Dressing Bordered Gauze, 2x2 in Discharge Instruction: Apply over primary dressing as directed. Secured With Compression Wrap Compression Stockings Environmental education officer) Signed: 04/15/2022 5:10:34 PM By: Adline Peals Entered By: Adline Peals on 04/15/2022 07:57:56 -------------------------------------------------------------------------------- Vitals Details Patient Name: Date of Service: [name]. 04/15/2022 7:45 A M Medical Record Number: [id number] Patient Account Number: [id number] Date of Birth/Sex: Treating RN: 14-Oct-1941 (81 y.o. [name] Primary Care Ona Roehrs: Marton Redwood Other Clinician: Referring Naomee Nowland: Treating Merisa Julio/Extender: Bethena Roys in Treatment: 5 Vital Signs Time Taken: 07:53 Temperature (F): 98.5 Height (in): 60 Pulse (bpm): 72 Weight (lbs): 102 Respiratory Rate (breaths/min): 16 Body Mass Index (BMI): 19.9 Blood Pressure (mmHg):  158/72 Reference Range: 80 - 120 mg / dl Electronic Signature(s) Signed: 04/15/2022 5:10:34 PM By: Adline Peals Entered By: Adline Peals on 04/15/2022 07:53:58

## 2022-04-22 ENCOUNTER — Encounter (HOSPITAL_BASED_OUTPATIENT_CLINIC_OR_DEPARTMENT_OTHER): Payer: PPO | Admitting: General Surgery

## 2022-04-22 DIAGNOSIS — L97822 Non-pressure chronic ulcer of other part of left lower leg with fat layer exposed: Secondary | ICD-10-CM | POA: Diagnosis not present

## 2022-04-22 NOTE — Progress Notes (Signed)
[name] ([id number]) Visit Report for 04/22/2022 Chief Complaint Document Details Patient Name: Date of Service: [name] 04/22/2022 7:45 A M Medical Record Number: [id number] Patient Account Number: [id number] Date of Birth/Sex: Treating RN: 1941/03/28 (81 y.o. F) Primary Care Provider: Marton Redwood Other Clinician: Referring Provider: Treating Provider/Extender: Bethena Roys in Treatment: 6 Information Obtained from: Patient Chief Complaint Patient seen for complaints of Non-Healing Wound. Electronic Signature(s) Signed: 04/22/2022 7:56:52 AM By: Fredirick Maudlin MD FACS Entered By: Fredirick Maudlin on 04/22/2022 07:56:52 -------------------------------------------------------------------------------- HPI Details Patient Name: Date of Service: [name]. 04/22/2022 7:45 A M Medical Record Number: [id number] Patient Account Number: [id number] Date of Birth/Sex: Treating RN: 1941/06/06 (81 y.o. F) Primary Care Provider: Marton Redwood Other Clinician: Referring Provider: Treating Provider/Extender: Bethena Roys in Treatment: 6 History of Present Illness HPI Description: ADMISSION 03/11/2022 This is an 81 year old woman with a past medical history notable for carotid artery disease, pulmonary hypertension, varicose veins, multiple orthopedic surgical procedures, and a perimembranous VSD who presents today with a nonhealing wound on her left anterior tibial surface. It occurred about 2 months ago when a vacuum cleaner implement fell from the closet and struck her in the leg. This resulted in a hematoma. Due to her multiple orthopedic procedures, she initially was seen at [facility]. They performed an x-ray that was negative and apparently basically told her that it would take some time to resolve. I am not entirely sure how the hematoma ended up being evacuated but she says that her primary care doctor put her  on doxycycline and has had her applying mupirocin with Prisma silver collagen to the site. She says in the past 7 days, since that treatment was initiated, she has noted significant improvement. It does not seem to have much drainage nor is there any odor. The periwound skin is in good condition. 03/18/2022: The wound is smaller today. There is just a bit of slough accumulation on the surface. We have been using mupirocin and Hydrofera Blue. 03/25/2022: The wound has contracted quite a bit today. There is no significant slough accumulation and there is a nice bit of granulation tissue. We have been using mupirocin with Hydrofera Blue and 3 layer compression. She asked if she could wear her compression hose instead of the 3 layer. 04/01/2022: The wound continues to contract. There is no slough and the surface is healthy with good granulation tissue. We have been using mupirocin with Hydrofera Blue and 3 layer compression. 04/08/2022: The wound is about the same size today but has a clean surface with good granulation tissue. We have been using mupirocin under Hydrofera Blue and 3 layer compression. She asked again today if she could wear some other compression stockings rather than our wrap. 04/15/2022: The wound is quite a bit smaller today. Last week, I allowed her to wear her own compression stocking and just applied a foam border dressing over the Prisma. It is clean without any slough accumulation in the periwound skin is in excellent condition. 04/22/2022: Her wound has healed. Electronic Signature(s) Signed: 04/22/2022 7:57:31 AM By: Fredirick Maudlin MD FACS Signed: 04/22/2022 7:57:31 AM By: Fredirick Maudlin MD FACS Entered By: Fredirick Maudlin on 04/22/2022 07:57:31 -------------------------------------------------------------------------------- Physical Exam Details Patient Name: Date of Service: [name]. 04/22/2022 7:45 A M Medical Record Number: [id number] Patient Account Number:  [id number] Date of Birth/Sex: Treating RN: 30-Oct-1941 (81 y.o. F) Primary Care Provider: Brigitte Pulse,  Gwyndolyn Saxon Other Clinician: Referring Provider: Treating Provider/Extender: Bethena Roys in Treatment: 6 Constitutional Slightly hypertensive. . . . No acute distress.Marland Kitchen Respiratory Normal work of breathing on room air.. Notes 04/22/2022: Her wound has healed. Electronic Signature(s) Signed: 04/22/2022 7:58:11 AM By: Fredirick Maudlin MD FACS Entered By: Fredirick Maudlin on 04/22/2022 07:58:11 -------------------------------------------------------------------------------- Physician Orders Details Patient Name: Date of Service: [name]. 04/22/2022 7:45 A M Medical Record Number: [id number] Patient Account Number: [id number] Date of Birth/Sex: Treating RN: 10-06-1941 (80 y.o. [name] Primary Care Provider: Marton Redwood Other Clinician: Referring Provider: Treating Provider/Extender: Bethena Roys in Treatment: 6 Verbal / Phone Orders: No Diagnosis Coding ICD-10 Coding Code Description [phone] Non-pressure chronic ulcer of other part of left lower leg with fat layer exposed I83.93 Asymptomatic varicose veins of bilateral lower extremities I27.20 Pulmonary hypertension, unspecified I77.9 Disorder of arteries and arterioles, unspecified Q21.0 Ventricular septal defect Discharge From [facility] Services Discharge from [place]!!! Edema Control - Lymphedema / SCD / Other Patient to wear own compression stockings every day. Moisturize legs daily. Electronic Signature(s) Signed: 04/22/2022 7:58:20 AM By: Fredirick Maudlin MD FACS Entered By: Fredirick Maudlin on 04/22/2022 07:58:20 -------------------------------------------------------------------------------- Problem List Details Patient Name: Date of Service: [name]. 04/22/2022 7:45 A M Medical Record Number: [id number] Patient Account  Number: [id number] Date of Birth/Sex: Treating RN: 01-May-1941 (81 y.o. F) Primary Care Provider: Marton Redwood Other Clinician: Referring Provider: Treating Provider/Extender: Bethena Roys in Treatment: 6 Active Problems ICD-10 Encounter Code Description Active Date MDM Diagnosis [phone] Non-pressure chronic ulcer of other part of left lower leg with fat layer exposed4/28/2023 No Yes I83.93 Asymptomatic varicose veins of bilateral lower extremities 03/11/2022 No Yes I27.20 Pulmonary hypertension, unspecified 03/11/2022 No Yes I77.9 Disorder of arteries and arterioles, unspecified 03/11/2022 No Yes Q21.0 Ventricular septal defect 03/11/2022 No Yes Inactive Problems Resolved Problems Electronic Signature(s) Signed: 04/22/2022 7:56:39 AM By: Fredirick Maudlin MD FACS Entered By: Fredirick Maudlin on 04/22/2022 07:56:39 -------------------------------------------------------------------------------- Progress Note Details Patient Name: Date of Service: [name]. 04/22/2022 7:45 A M Medical Record Number: [id number] Patient Account Number: [id number] Date of Birth/Sex: Treating RN: 1941-02-04 (81 y.o. F) Primary Care Provider: Marton Redwood Other Clinician: Referring Provider: Treating Provider/Extender: Bethena Roys in Treatment: 6 Subjective Chief Complaint Information obtained from Patient Patient seen for complaints of Non-Healing Wound. History of Present Illness (HPI) ADMISSION 03/11/2022 This is an 81 year old woman with a past medical history notable for carotid artery disease, pulmonary hypertension, varicose veins, multiple orthopedic surgical procedures, and a perimembranous VSD who presents today with a nonhealing wound on her left anterior tibial surface. It occurred about 2 months ago when a vacuum cleaner implement fell from the closet and struck her in the leg. This resulted in a hematoma. Due to her multiple  orthopedic procedures, she initially was seen at [facility]. They performed an x-ray that was negative and apparently basically told her that it would take some time to resolve. I am not entirely sure how the hematoma ended up being evacuated but she says that her primary care doctor put her on doxycycline and has had her applying mupirocin with Prisma silver collagen to the site. She says in the past 7 days, since that treatment was initiated, she has noted significant improvement. It does not seem to have much drainage nor is there any odor. The periwound skin is in good condition.  03/18/2022: The wound is smaller today. There is just a bit of slough accumulation on the surface. We have been using mupirocin and Hydrofera Blue. 03/25/2022: The wound has contracted quite a bit today. There is no significant slough accumulation and there is a nice bit of granulation tissue. We have been using mupirocin with Hydrofera Blue and 3 layer compression. She asked if she could wear her compression hose instead of the 3 layer. 04/01/2022: The wound continues to contract. There is no slough and the surface is healthy with good granulation tissue. We have been using mupirocin with Hydrofera Blue and 3 layer compression. 04/08/2022: The wound is about the same size today but has a clean surface with good granulation tissue. We have been using mupirocin under Hydrofera Blue and 3 layer compression. She asked again today if she could wear some other compression stockings rather than our wrap. 04/15/2022: The wound is quite a bit smaller today. Last week, I allowed her to wear her own compression stocking and just applied a foam border dressing over the Prisma. It is clean without any slough accumulation in the periwound skin is in excellent condition. 04/22/2022: Her wound has healed. Patient History Information obtained from Patient. Family History Unknown History. Social History Never smoker, Marital Status -  Married, Alcohol Use - Rarely, Drug Use - No History, Caffeine Use - Daily - coffee. Medical History Eyes Patient has history of Cataracts - Had cataracts removed Cardiovascular Patient has history of Hypertension Hospitalization/Surgery History - Bilateral Joint Replacements; Bilateral foot surgery (as per pt.). Medical A Surgical History Notes nd Respiratory Pulmonary Hypertension Cardiovascular Loop Recorder Cryptogenic Stroke-status post ILR Dyslipidemia Small perimembranous Ventricular Septal Defect Heart Murmur Objective Constitutional Slightly hypertensive. No acute distress.. Vitals Time Taken: 7:44 AM, Height: 60 in, Weight: 102 lbs, BMI: 19.9, Temperature: 98.1 F, Pulse: 67 bpm, Respiratory Rate: 16 breaths/min, Blood Pressure: 146/78 mmHg. Respiratory Normal work of breathing on room air.. General Notes: 04/22/2022: Her wound has healed. Integumentary (Hair, Skin) Wound #1 status is Healed - Epithelialized. Original cause of wound was Trauma. The date acquired was: 01/07/2022. The wound has been in treatment 6 weeks. The wound is located on the Left,Medial Lower Leg. The wound measures 0cm length x 0cm width x 0cm depth; 0cm^2 area and 0cm^3 volume. There is no tunneling or undermining noted. There is a none present amount of drainage noted. The wound margin is distinct with the outline attached to the wound base. There is no granulation within the wound bed. There is no necrotic tissue within the wound bed. Assessment Active Problems ICD-10 Non-pressure chronic ulcer of other part of left lower leg with fat layer exposed Asymptomatic varicose veins of bilateral lower extremities Pulmonary hypertension, unspecified Disorder of arteries and arterioles, unspecified Ventricular septal defect Plan Discharge From [facility] Services: Discharge from [place]!!! Edema Control - Lymphedema / SCD / Other: Patient to wear own compression stockings every  day. Moisturize legs daily. 04/22/2022: Her wound has healed. She will be discharged from the wound care center. I have recommended that she continue to wear her compression stockings daily. She will follow-up as needed. Electronic Signature(s) Signed: 04/22/2022 7:58:47 AM By: Fredirick Maudlin MD FACS Entered By: Fredirick Maudlin on 04/22/2022 07:58:46 -------------------------------------------------------------------------------- HxROS Details Patient Name: Date of Service: [name]. 04/22/2022 7:45 A M Medical Record Number: [id number] Patient Account Number: [id number] Date of Birth/Sex: Treating RN: [birth year] (81 y.o. F) Primary Care Provider: Marton Redwood Other Clinician: Referring  Provider: Treating Provider/Extender: Bethena Roys in Treatment: 6 Information Obtained From Patient Eyes Medical History: Positive for: Cataracts - Had cataracts removed Respiratory Medical History: Past Medical History Notes: Pulmonary Hypertension Cardiovascular Medical History: Positive for: Hypertension Past Medical History Notes: Loop Recorder Cryptogenic Stroke-status post ILR Dyslipidemia Small perimembranous Ventricular Septal Defect Heart Murmur HBO Extended History Items Eyes: Cataracts Immunizations Pneumococcal Vaccine: Received Pneumococcal Vaccination: Yes Received Pneumococcal Vaccination On or After 60th Birthday: Yes Implantable Devices No devices added Hospitalization / Surgery History Type of Hospitalization/Surgery Bilateral Joint Replacements; Bilateral foot surgery (as per pt.) Family and Social History Unknown History: Yes; Never smoker; Marital Status - Married; Alcohol Use: Rarely; Drug Use: No History; Caffeine Use: Daily - coffee; Financial Concerns: No; Food, Clothing or Shelter Needs: No; Support System Lacking: No; Transportation Concerns: No Electronic Signature(s) Signed: 04/22/2022 9:18:56 AM By: Fredirick Maudlin MD  FACS Entered By: Fredirick Maudlin on 04/22/2022 07:57:46 -------------------------------------------------------------------------------- SuperBill Details Patient Name: Date of Service: [name] 04/22/2022 Medical Record Number: [id number] Patient Account Number: [id number] Date of Birth/Sex: Treating RN: October 23, 1941 (81 y.o. [name] Primary Care Provider: Marton Redwood Other Clinician: Referring Provider: Treating Provider/Extender: Bethena Roys in Treatment: 6 Diagnosis Coding ICD-10 Codes Code Description [phone] Non-pressure chronic ulcer of other part of left lower leg with fat layer exposed I83.93 Asymptomatic varicose veins of bilateral lower extremities I27.20 Pulmonary hypertension, unspecified I77.9 Disorder of arteries and arterioles, unspecified Q21.0 Ventricular septal defect Facility Procedures CPT4 Code: [id number] Description: 99213 - WOUND CARE VISIT-LEV 3 EST PT Modifier: Quantity: 1 Physician Procedures : CPT4 Code Description Modifier [id number] - WC PHYS LEVEL 3 - EST PT ICD-10 Diagnosis Description L97.822 Non-pressure chronic ulcer of other part of left lower leg with fat layer exposed I83.93 Asymptomatic varicose veins of bilateral lower  extremities I27.20 Pulmonary hypertension, unspecified I77.9 Disorder of arteries and arterioles, unspecified Quantity: 1 Electronic Signature(s) Signed: 04/22/2022 8:08:17 AM By: Fredirick Maudlin MD FACS Entered By: Fredirick Maudlin on 04/22/2022 08:08:17

## 2022-04-25 NOTE — Progress Notes (Signed)
[name], [name] ([id number]) Visit Report for 04/22/2022 Arrival Information Details Patient Name: Date of Service: [name] 04/22/2022 7:45 A M Medical Record Number: [id number] Patient Account Number: [id number] Date of Birth/Sex: Treating RN: [birth year] (81 y.o. [name] Primary Care Lacorey Brusca: Marton Redwood Other Clinician: Referring Shontelle Muska: Treating Jaelin Fackler/Extender: Bethena Roys in Treatment: 6 Visit Information History Since Last Visit Added or deleted any medications: No Patient Arrived: Ambulatory Any new allergies or adverse reactions: No Arrival Time: 07:43 Had a fall or experienced change in No Accompanied By: self activities of daily living that may affect Transfer Assistance: None risk of falls: Patient Identification Verified: Yes Signs or symptoms of abuse/neglect since last visito No Secondary Verification Process Completed: Yes Hospitalized since last visit: No Patient Requires Transmission-Based Precautions: No Implantable device outside of the clinic excluding No Patient Has Alerts: Yes cellular tissue based products placed in the center Patient Alerts: Patient on Blood Thinner since last visit: Has Dressing in Place as Prescribed: Yes Pain Present Now: No Electronic Signature(s) Signed: 04/25/2022 5:17:06 PM By: Adline Peals Entered By: Adline Peals on 04/22/2022 07:44:39 -------------------------------------------------------------------------------- Clinic Level of Care Assessment Details Patient Name: Date of Service: [name] 04/22/2022 7:45 A M Medical Record Number: [id number] Patient Account Number: [id number] Date of Birth/Sex: Treating RN: 11-02-1941 (81 y.o. [name] Primary Care Epifania Littrell: Marton Redwood Other Clinician: Referring Shaquanda Graves: Treating Rogan Ecklund/Extender: Bethena Roys in Treatment: 6 Clinic Level of Care Assessment  Items TOOL 4 Quantity Score X- 1 0 Use when only an EandM is performed on FOLLOW-UP visit ASSESSMENTS - Nursing Assessment / Reassessment X- 1 10 Reassessment of Co-morbidities (includes updates in patient status) X- 1 5 Reassessment of Adherence to Treatment Plan ASSESSMENTS - Wound and Skin A ssessment / Reassessment X - Simple Wound Assessment / Reassessment - one wound 1 5 '[]'$  - 0 Complex Wound Assessment / Reassessment - multiple wounds '[]'$  - 0 Dermatologic / Skin Assessment (not related to wound area) ASSESSMENTS - Focused Assessment X- 1 5 Circumferential Edema Measurements - multi extremities '[]'$  - 0 Nutritional Assessment / Counseling / Intervention X- 1 5 Lower Extremity Assessment (monofilament, tuning fork, pulses) '[]'$  - 0 Peripheral Arterial Disease Assessment (using hand held doppler) ASSESSMENTS - Ostomy and/or Continence Assessment and Care '[]'$  - 0 Incontinence Assessment and Management '[]'$  - 0 Ostomy Care Assessment and Management (repouching, etc.) PROCESS - Coordination of Care X - Simple Patient / Family Education for ongoing care 1 15 '[]'$  - 0 Complex (extensive) Patient / Family Education for ongoing care X- 1 10 Staff obtains Programmer, systems, Records, T Results / Process Orders est '[]'$  - 0 Staff telephones HHA, Nursing Homes / Clarify orders / etc '[]'$  - 0 Routine Transfer to another Facility (non-emergent condition) '[]'$  - 0 Routine Hospital Admission (non-emergent condition) '[]'$  - 0 New Admissions / Biomedical engineer / Ordering NPWT Apligraf, etc. , '[]'$  - 0 Emergency Hospital Admission (emergent condition) X- 1 10 Simple Discharge Coordination '[]'$  - 0 Complex (extensive) Discharge Coordination PROCESS - Special Needs '[]'$  - 0 Pediatric / Minor Patient Management '[]'$  - 0 Isolation Patient Management '[]'$  - 0 Hearing / Language / Visual special needs '[]'$  - 0 Assessment of Community assistance (transportation, D/C planning, etc.) '[]'$  - 0 Additional  assistance / Altered mentation '[]'$  - 0 Support Surface(s) Assessment (bed, cushion, seat, etc.) INTERVENTIONS - Wound Cleansing / Measurement X - Simple Wound Cleansing - one wound 1  5 '[]'$  - 0 Complex Wound Cleansing - multiple wounds X- 1 5 Wound Imaging (photographs - any number of wounds) '[]'$  - 0 Wound Tracing (instead of photographs) X- 1 5 Simple Wound Measurement - one wound '[]'$  - 0 Complex Wound Measurement - multiple wounds INTERVENTIONS - Wound Dressings '[]'$  - 0 Small Wound Dressing one or multiple wounds '[]'$  - 0 Medium Wound Dressing one or multiple wounds '[]'$  - 0 Large Wound Dressing one or multiple wounds '[]'$  - 0 Application of Medications - topical '[]'$  - 0 Application of Medications - injection INTERVENTIONS - Miscellaneous '[]'$  - 0 External ear exam '[]'$  - 0 Specimen Collection (cultures, biopsies, blood, body fluids, etc.) '[]'$  - 0 Specimen(s) / Culture(s) sent or taken to Lab for analysis '[]'$  - 0 Patient Transfer (multiple staff / Civil Service fast streamer / Similar devices) '[]'$  - 0 Simple Staple / Suture removal (25 or less) '[]'$  - 0 Complex Staple / Suture removal (26 or more) '[]'$  - 0 Hypo / Hyperglycemic Management (close monitor of Blood Glucose) '[]'$  - 0 Ankle / Brachial Index (ABI) - do not check if billed separately X- 1 5 Vital Signs Has the patient been seen at the hospital within the last three years: Yes Total Score: 85 Level Of Care: New/Established - Level 3 Electronic Signature(s) Signed: 04/25/2022 5:17:06 PM By: Adline Peals Entered By: Adline Peals on 04/22/2022 07:57:53 -------------------------------------------------------------------------------- Encounter Discharge Information Details Patient Name: Date of Service: [name]. 04/22/2022 7:45 A M Medical Record Number: [id number] Patient Account Number: [id number] Date of Birth/Sex: Treating RN: 1941/01/29 (81 y.o. [name] Primary Care Hannan Hutmacher: Marton Redwood Other  Clinician: Referring Thoma Paulsen: Treating Mylen Mangan/Extender: Bethena Roys in Treatment: 6 Encounter Discharge Information Items Discharge Condition: Stable Ambulatory Status: Ambulatory Discharge Destination: Home Transportation: Private Auto Accompanied By: self Schedule Follow-up Appointment: Yes Clinical Summary of Care: Patient Declined Electronic Signature(s) Signed: 04/25/2022 5:17:06 PM By: Sabas Sous By: Adline Peals on 04/22/2022 07:58:21 -------------------------------------------------------------------------------- Lower Extremity Assessment Details Patient Name: Date of Service: [name]. 04/22/2022 7:45 A M Medical Record Number: [id number] Patient Account Number: [id number] Date of Birth/Sex: Treating RN: 06-Sep-1941 (81 y.o. [name] Primary Care Runa Whittingham: Marton Redwood Other Clinician: Referring Margrette Wynia: Treating Devanshi Califf/Extender: Bethena Roys in Treatment: 6 Edema Assessment Assessed: Shirlyn Goltz: No] [Right: No] Edema: [Left: N] [Right: o] Calf Left: Right: Point of Measurement: 29 cm From Medial Instep 30.4 cm Ankle Left: Right: Point of Measurement: 8 cm From Medial Instep 20 cm Vascular Assessment Pulses: Dorsalis Pedis Palpable: [Left:Yes] Electronic Signature(s) Signed: 04/25/2022 5:17:06 PM By: Adline Peals Entered By: Adline Peals on 04/22/2022 07:47:00 -------------------------------------------------------------------------------- Multi Wound Chart Details Patient Name: Date of Service: [name]. 04/22/2022 7:45 A M Medical Record Number: [id number] Patient Account Number: [id number] Date of Birth/Sex: Treating RN: [birth year] (81 y.o. F) Primary Care Koleton Duchemin: Marton Redwood Other Clinician: Referring Marsella Suman: Treating Sritha Chauncey/Extender: Bethena Roys in Treatment: 6 Vital Signs Height(in): 60 Pulse(bpm):  60 Weight(lbs): 102 Blood Pressure(mmHg): 146/78 Body Mass Index(BMI): 19.9 Temperature(F): 98.1 Respiratory Rate(breaths/min): 16 Photos: [N/A:N/A] Left, Medial Lower Leg N/A N/A Wound Location: Trauma N/A N/A Wounding Event: Venous Leg Ulcer N/A N/A Primary Etiology: Cataracts, Hypertension N/A N/A Comorbid History: 01/07/2022 N/A N/A Date Acquired: 6 N/A N/A Weeks of Treatment: Healed - Epithelialized N/A N/A Wound Status: No N/A N/A Wound Recurrence: 0x0x0 N/A N/A Measurements L x W x D (cm) 0 N/A  N/A A (cm) : rea 0 N/A N/A Volume (cm) : 100.00% N/A N/A % Reduction in A rea: 100.00% N/A N/A % Reduction in Volume: Full Thickness Without Exposed N/A N/A Classification: Support Structures None Present N/A N/A Exudate Amount: Distinct, outline attached N/A N/A Wound Margin: None Present (0%) N/A N/A Granulation Amount: None Present (0%) N/A N/A Necrotic Amount: Fascia: No N/A N/A Exposed Structures: Fat Layer (Subcutaneous Tissue): No Tendon: No Muscle: No Joint: No Bone: No Large (67-100%) N/A N/A Epithelialization: Treatment Notes Electronic Signature(s) Signed: 04/22/2022 7:56:46 AM By: Fredirick Maudlin MD FACS Signed: 04/22/2022 7:56:46 AM By: Fredirick Maudlin MD FACS Entered By: Fredirick Maudlin on 04/22/2022 07:56:46 -------------------------------------------------------------------------------- Multi-Disciplinary Care Plan Details Patient Name: Date of Service: [name]. 04/22/2022 7:45 A M Medical Record Number: [id number] Patient Account Number: [id number] Date of Birth/Sex: Treating RN: August 19, 1941 (81 y.o. [name] Primary Care Christyl Osentoski: Marton Redwood Other Clinician: Referring Ayari Liwanag: Treating Elayjah Chaney/Extender: Bethena Roys in Treatment: 6 Active Inactive Electronic Signature(s) Signed: 04/25/2022 5:17:06 PM By: Sabas Sous By: Adline Peals on 04/22/2022  07:55:28 -------------------------------------------------------------------------------- Pain Assessment Details Patient Name: Date of Service: [name]. 04/22/2022 7:45 A M Medical Record Number: [id number] Patient Account Number: [id number] Date of Birth/Sex: Treating RN: May 18, 1941 (81 y.o. [name] Primary Care Zurich Carreno: Marton Redwood Other Clinician: Referring Camisha Srey: Treating Idelle Reimann/Extender: Bethena Roys in Treatment: 6 Active Problems Location of Pain Severity and Description of Pain Patient Has Paino No Site Locations Rate the pain. Current Pain Level: 0 Pain Management and Medication Current Pain Management: Electronic Signature(s) Signed: 04/25/2022 5:17:06 PM By: Adline Peals Entered By: Adline Peals on 04/22/2022 07:45:31 -------------------------------------------------------------------------------- Patient/Caregiver Education Details Patient Name: Date of Service: [name] 6/9/2023andnbsp7:45 A M Medical Record Number: [id number] Patient Account Number: [id number] Date of Birth/Gender: Treating RN: 1941-04-21 (81 y.o. [name] Primary Care Physician: Marton Redwood Other Clinician: Referring Physician: Treating Physician/Extender: Bethena Roys in Treatment: 6 Education Assessment Education Provided To: Patient Education Topics Provided Wound/Skin Impairment: Methods: Explain/Verbal Responses: Reinforcements needed, State content correctly Electronic Signature(s) Signed: 04/25/2022 5:17:06 PM By: Adline Peals Entered By: Adline Peals on 04/22/2022 07:50:08 -------------------------------------------------------------------------------- Wound Assessment Details Patient Name: Date of Service: [name]. 04/22/2022 7:45 A M Medical Record Number: [id number] Patient Account Number: [id number] Date of Birth/Sex: Treating  RN: Sep 20, 1941 (81 y.o. [name] Primary Care Kennice Finnie: Marton Redwood Other Clinician: Referring Rhondalyn Clingan: Treating Daemyn Gariepy/Extender: Bethena Roys in Treatment: 6 Wound Status Wound Number: 1 Primary Etiology: Venous Leg Ulcer Wound Location: Left, Medial Lower Leg Wound Status: Healed - Epithelialized Wounding Event: Trauma Comorbid History: Cataracts, Hypertension Date Acquired: 01/07/2022 Weeks Of Treatment: 6 Clustered Wound: No Photos Wound Measurements Length: (cm) Width: (cm) Depth: (cm) Area: (cm) Volume: (cm) 0 % Reduction in Area: 100% 0 % Reduction in Volume: 100% 0 Epithelialization: Large (67-100%) 0 Tunneling: No 0 Undermining: No Wound Description Classification: Full Thickness Without Exposed Support Structures Wound Margin: Distinct, outline attached Exudate Amount: None Present Foul Odor After Cleansing: No Slough/Fibrino No Wound Bed Granulation Amount: None Present (0%) Exposed Structure Necrotic Amount: None Present (0%) Fascia Exposed: No Fat Layer (Subcutaneous Tissue) Exposed: No Tendon Exposed: No Muscle Exposed: No Joint Exposed: No Bone Exposed: No Electronic Signature(s) Signed: 04/25/2022 5:17:06 PM By: Adline Peals Entered By: Adline Peals on 04/22/2022 07:55:17 -------------------------------------------------------------------------------- [place] Details Patient Name: Date of  Service: [name]. 04/22/2022 7:45 A M Medical Record Number: [id number] Patient Account Number: [id number] Date of Birth/Sex: Treating RN: Jun 14, 1941 (81 y.o. [name] Primary Care Jacquelene Kopecky: Marton Redwood Other Clinician: Referring Murl Zogg: Treating Yesica Kemler/Extender: Bethena Roys in Treatment: 6 Vital Signs Time Taken: 07:44 Temperature (F): 98.1 Height (in): 60 Pulse (bpm): 67 Weight (lbs): 102 Respiratory Rate (breaths/min): 16 Body Mass Index  (BMI): 19.9 Blood Pressure (mmHg): 146/78 Reference Range: 80 - 120 mg / dl Electronic Signature(s) Signed: 04/25/2022 5:17:06 PM By: Adline Peals Entered By: Adline Peals on 04/22/2022 07:44:56

## 2022-04-28 ENCOUNTER — Telehealth: Payer: Self-pay | Admitting: Cardiovascular Disease

## 2022-04-28 NOTE — Telephone Encounter (Signed)
  Per MyChart scheduling message:  Would it be possible for you to ask Dr. Loletha Grayer about the necessity of making this appointment.  The battery in  my Loop Recorder stopped a couple months ago and I thought I would discuss with him again whether or not I should consider replacing the battery.  Is there an alternative to waiting until October to see him?  I have put 08/17/22 on my calendar until we make a decision about this.  Thank you.  K

## 2022-04-28 NOTE — Telephone Encounter (Signed)
Called pt to let her know Dr. Loletha Grayer is out of the office until later this month. Message will be sent to Dr. Lurline Del box for review.

## 2022-05-02 DIAGNOSIS — D1801 Hemangioma of skin and subcutaneous tissue: Secondary | ICD-10-CM | POA: Diagnosis not present

## 2022-05-02 DIAGNOSIS — L570 Actinic keratosis: Secondary | ICD-10-CM | POA: Diagnosis not present

## 2022-05-02 DIAGNOSIS — Z85828 Personal history of other malignant neoplasm of skin: Secondary | ICD-10-CM | POA: Diagnosis not present

## 2022-05-02 DIAGNOSIS — L821 Other seborrheic keratosis: Secondary | ICD-10-CM | POA: Diagnosis not present

## 2022-05-02 DIAGNOSIS — D692 Other nonthrombocytopenic purpura: Secondary | ICD-10-CM | POA: Diagnosis not present

## 2022-05-09 NOTE — Telephone Encounter (Signed)
Called [name]. Has decided to leave the loop recorder in place, rather than remove it. She will continue her routine follow up with Dr. Rennis Golden.

## 2022-06-03 DIAGNOSIS — H35373 Puckering of macula, bilateral: Secondary | ICD-10-CM | POA: Diagnosis not present

## 2022-06-03 DIAGNOSIS — Z961 Presence of intraocular lens: Secondary | ICD-10-CM | POA: Diagnosis not present

## 2022-06-03 DIAGNOSIS — H35342 Macular cyst, hole, or pseudohole, left eye: Secondary | ICD-10-CM | POA: Diagnosis not present

## 2022-06-06 DIAGNOSIS — M13841 Other specified arthritis, right hand: Secondary | ICD-10-CM | POA: Diagnosis not present

## 2022-06-06 DIAGNOSIS — S63003A Unspecified subluxation of unspecified wrist and hand, initial encounter: Secondary | ICD-10-CM | POA: Diagnosis not present

## 2022-06-06 DIAGNOSIS — M19041 Primary osteoarthritis, right hand: Secondary | ICD-10-CM | POA: Diagnosis not present

## 2022-07-01 DIAGNOSIS — N3941 Urge incontinence: Secondary | ICD-10-CM | POA: Diagnosis not present

## 2022-07-01 DIAGNOSIS — R351 Nocturia: Secondary | ICD-10-CM | POA: Diagnosis not present

## 2022-07-01 DIAGNOSIS — N3281 Overactive bladder: Secondary | ICD-10-CM | POA: Diagnosis not present

## 2022-08-02 ENCOUNTER — Other Ambulatory Visit (HOSPITAL_BASED_OUTPATIENT_CLINIC_OR_DEPARTMENT_OTHER): Payer: Self-pay

## 2022-08-02 MED ORDER — INFLUENZA VAC A&B SA ADJ QUAD 0.5 ML IM PRSY
PREFILLED_SYRINGE | INTRAMUSCULAR | 0 refills | Status: DC
  Filled 2022-08-02: qty 0.5, 1d supply, fill #0

## 2022-08-16 NOTE — Progress Notes (Signed)
Cardiology Office Note:    Date:  08/22/2022   ID:  [name], DOB November 11, 1941, MRN [id number]  PCP:  Ginger Organ., MD  [facility] Cardiologist:  Pixie Casino, MD  [place] Electrophysiologist:  None   Referring MD: Ginger Organ., MD   No chief complaint on file.    History of Present Illness:    [name] is a 81 y.o. female with a hx of a tiny restrictive membranous VSD, mild pulmonary artery hypertension, cryptogenic stroke 2019, status post loop recorder which has not shown atrial fibrillation, but did record a single 4-second episode of regular paroxysmal atrial tachycardia in April 2021 and a 5-second sinus pause in July 2021.  Both of those events were asymptomatic.  The loop recorder has reached end of service as of 03/08/2022.  She has considered whether or not to repeat move the loop recorder and has decided to leave it in.  Generally feeling quite well and denies any new episodes of syncope any focal neurological deficits or any cardiovascular complaints in general.  She last saw Dr. Debara Pickett in April of this year when she had a slow healing posttraumatic ulceration on her left leg, on a background of peripheral venous insufficiency with varicose veins.  This has since healed.  On chronic aspirin and statin therapy and very low-dose ACE inhibitor for hypertension.    Past Medical History:  Diagnosis Date   Arthritis    Heart murmur    Stroke [facility])     Past Surgical History:  Procedure Laterality Date   ABDOMINAL HYSTERECTOMY     age 56   APPENDECTOMY     DOPPLER ECHOCARDIOGRAPHY  2013   Duplex Doppler  2013   EXCISIONAL TOTAL HIP ARTHROPLASTY WITH ANTIBIOTIC SPACERS  09/25/2012   Procedure: EXCISIONAL TOTAL HIP ARTHROPLASTY WITH ANTIBIOTIC SPACERS;  Surgeon: [name], MD;  Location: [facility];  Service: Orthopedics;  Laterality: Left;  RESECTION LEFT TOTAL HIP ARTHROPLASTY AND PLACEMENT OF ANTIBIOTIC SPACERs   EYE SURGERY   02/2010   bilateral cataracts   INCISION AND DRAINAGE HIP  11/11/2011   Procedure: IRRIGATION AND DEBRIDEMENT HIP WITH POLY EXCHANGE;  Surgeon: [name];  Location: [facility];  Service: Orthopedics;  Laterality: Left;  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   Calcium Carbonate-Vitamin D (CALCIUM + D PO) Take 1 tablet by mouth daily.    lisinopril (ZESTRIL) 5 MG tablet TAKE 1 TABLET BY MOUTH EVERY DAY   MYRBETRIQ 50 MG TB24 Take 50 mg by mouth daily.    rosuvastatin (CRESTOR) 10 MG tablet Take 10 mg by mouth daily.     Allergies:   Bactrim   Social History   Socioeconomic History   Marital status: Married    Spouse name: Not on file   Number of children: Not on file   Years of education: Not on file   Highest education level: Not on file  Occupational History   Not on file  Tobacco Use   Smoking status: Never   Smokeless tobacco: Never  Substance and Sexual Activity   Alcohol use: Yes    Alcohol/week: 1.0 standard drink of alcohol    Types: 1 Glasses of wine per week    Comment: occ.   Drug use: No   Sexual activity: Not on file  Other Topics Concern   Not on file  Social History Narrative   Not on file   Social Determinants of Health   Financial Resource Strain: Not on file  Food Insecurity: Not on file  Transportation Needs: Not on file  Physical Activity: Not on file  Stress: Not on file  Social Connections: Not on file     Family History: The patient's family history includes Failure to thrive in her  mother; Stroke in her father.  ROS:   Please see the history of present illness.     All other systems reviewed and are negative.  EKGs/Labs/Other Studies Reviewed:    The following studies were reviewed today: Loop recorder download performed within the last couple of days showing no episodes of arrhythmia  EKG:  EKG is ordered today.  It shows normal sinus rhythm and questionable left atrial abnormality, otherwise normal  Recent Labs: No results found for requested labs within last 365 days.  Recent Lipid Panel    Component Value Date/Time   CHOL 197 07/09/2018 1343   TRIG 76 07/09/2018 1343   HDL 83 07/09/2018 1343   CHOLHDL 2.4 07/09/2018 1343   LDLCALC 99 07/09/2018 1343  Labs performed in 2022 and Dr. Raul Del office Cholesterol 151, triglycerides 49, HDL 83, LDL 58 Creatinine 0.8, potassium 5.1, hemoglobin 12.9, TSH 1.7, normal liver function test  Physical Exam:    VS:  BP 138/70 (BP Location: Left Arm, Patient Position: Sitting, Cuff Size: Normal)   Pulse 85   Ht 5' (1.524 m)   Wt 103 lb 6.4 oz (46.9 kg)   SpO2 97%   BMI 20.19 kg/m     Wt Readings from Last 3 Encounters:  08/17/22 103 lb 6.4 oz (46.9 kg)  03/02/22 103 lb 9.6 oz (47 kg)  05/28/21 104 lb 12.8 oz (47.5 kg)     General: Alert, oriented x3, no distress, very slender, petite.  Appears younger than stated age. Head: no evidence of trauma, PERRL, EOMI, no exophtalmos or lid lag, no myxedema, no xanthelasma; normal ears, nose and oropharynx Neck: normal jugular venous pulsations and no hepatojugular reflux; brisk carotid pulses without delay and no carotid bruits Chest: clear to auscultation, no signs of consolidation by percussion or palpation, normal fremitus, symmetrical and full respiratory excursions Cardiovascular: normal position and quality of the apical impulse, regular rhythm, normal first and second heart sounds, 3-4/6 widely radiating systolic murmur of ventricular septal defect, heard both  anteriorly and in the interscapular area, there appears to be a separate 2/6 early peaking ejection type murmur heard at the right upper sternal border radiating towards the base of the neck, no diastolic murmurs, rubs or gallops Abdomen: no tenderness or distention, no masses by palpation, no abnormal pulsatility or arterial bruits, normal bowel sounds, no hepatosplenomegaly Extremities: no clubbing, cyanosis or edema; 2+ radial, ulnar and brachial pulses bilaterally; 2+ right femoral, posterior tibial and dorsalis pedis pulses; 2+ left femoral, posterior tibial and dorsalis pedis pulses; no subclavian or femoral bruits Neurological: grossly nonfocal Psych: Normal mood and affect    ASSESSMENT:    1. Sinus pause   2. History of stroke  3. Encounter for loop recorder at end of battery life   4. Hypertension, essential   5. Mixed hyperlipidemia   6. VSD (ventricular septal defect), perimembranous   7. Aortic valve sclerosis      PLAN:    In order of problems listed above:  Sinus pause: This occurred only once and was completely asymptomatic.  Has not been seen again in over a year of additional monitoring. Hx CVA: Atrial fibrillation was not detected in over 3 years of loop recorder monitoring.  This makes it highly unlikely that her previous stroke was related to atrial fibrillation. ILR: Reached of service in April 2023.  We will not be replaced. HTN: Adequate control HLP: On statin all her lipid parameters are within appropriate range. VSD: Very loud murmur of restrictive VSD which is not hemodynamically significant. Aortic valve sclerosis: She has a different systolic murmur in aortic focus radiating towards the carotids, with a typical crescendo-decrescendo pattern.  Would recommend repeating an echocardiogram in another couple of years to make sure she has not developed aortic stenosis.   Medication Adjustments/Labs and Tests Ordered: Current medicines are reviewed at length  with the patient today.  Concerns regarding medicines are outlined above.  No orders of the defined types were placed in this encounter.   No orders of the defined types were placed in this encounter.    Patient Instructions  Medication Instructions:  No changes *If you need a refill on your cardiac medications before your next appointment, please call your pharmacy*   Lab Work: None ordered If you have labs (blood work) drawn today and your tests are completely normal, you will receive your results only by: [place] (if you have MyChart) OR A paper copy in the mail If you have any lab test that is abnormal or we need to change your treatment, we will call you to review the results.   Testing/Procedures: None ordered   Follow-Up: At [facility], you and your health needs are our priority.  As part of our continuing mission to provide you with exceptional heart care, we have created designated Provider Care Teams.  These Care Teams include your primary Cardiologist (physician) and Advanced Practice Providers (APPs -  Physician Assistants and Nurse Practitioners) who all work together to provide you with the care you need, when you need it.  We recommend signing up for the patient portal called "MyChart".  Sign up information is provided on this After Visit Summary.  MyChart is used to connect with patients for Virtual Visits (Telemedicine).  Patients are able to view lab/test results, encounter notes, upcoming appointments, etc.  Non-urgent messages can be sent to your provider as well.   To learn more about what you can do with MyChart, go to [email or web address].    Your next appointment:   Follow up as needed with Dr. Sallyanne Kuster  Important Information About Sugar         Signed, [name], MD  08/22/2022 3:31 PM    [place]

## 2022-08-17 ENCOUNTER — Encounter: Payer: Self-pay | Admitting: Cardiovascular Disease

## 2022-08-17 ENCOUNTER — Ambulatory Visit: Payer: PPO | Attending: Cardiovascular Disease | Admitting: Cardiovascular Disease

## 2022-08-17 VITALS — BP 138/70 | HR 85 | Ht 60.0 in | Wt 103.4 lb

## 2022-08-17 DIAGNOSIS — E782 Mixed hyperlipidemia: Secondary | ICD-10-CM | POA: Diagnosis not present

## 2022-08-17 DIAGNOSIS — Z4509 Encounter for adjustment and management of other cardiac device: Secondary | ICD-10-CM

## 2022-08-17 DIAGNOSIS — Q210 Ventricular septal defect: Secondary | ICD-10-CM | POA: Diagnosis not present

## 2022-08-17 DIAGNOSIS — I10 Essential (primary) hypertension: Secondary | ICD-10-CM

## 2022-08-17 DIAGNOSIS — I455 Other specified heart block: Secondary | ICD-10-CM

## 2022-08-17 DIAGNOSIS — I358 Other nonrheumatic aortic valve disorders: Secondary | ICD-10-CM | POA: Diagnosis not present

## 2022-08-17 DIAGNOSIS — Z8673 Personal history of transient ischemic attack (TIA), and cerebral infarction without residual deficits: Secondary | ICD-10-CM | POA: Diagnosis not present

## 2022-08-17 NOTE — Patient Instructions (Signed)
Medication Instructions:  No changes *If you need a refill on your cardiac medications before your next appointment, please call your pharmacy*   Lab Work: None ordered If you have labs (blood work) drawn today and your tests are completely normal, you will receive your results only by: [place] (if you have MyChart) OR A paper copy in the mail If you have any lab test that is abnormal or we need to change your treatment, we will call you to review the results.   Testing/Procedures: None ordered   Follow-Up: At [facility], you and your health needs are our priority.  As part of our continuing mission to provide you with exceptional heart care, we have created designated Provider Care Teams.  These Care Teams include your primary Cardiologist (physician) and Advanced Practice Providers (APPs -  Physician Assistants and Nurse Practitioners) who all work together to provide you with the care you need, when you need it.  We recommend signing up for the patient portal called "MyChart".  Sign up information is provided on this After Visit Summary.  MyChart is used to connect with patients for Virtual Visits (Telemedicine).  Patients are able to view lab/test results, encounter notes, upcoming appointments, etc.  Non-urgent messages can be sent to your provider as well.   To learn more about what you can do with MyChart, go to [email or web address].    Your next appointment:   Follow up as needed with Dr. Sallyanne Kuster  Important Information About [place]

## 2022-08-30 ENCOUNTER — Other Ambulatory Visit: Payer: Self-pay | Admitting: Cardiovascular Disease

## 2022-09-06 ENCOUNTER — Other Ambulatory Visit (HOSPITAL_BASED_OUTPATIENT_CLINIC_OR_DEPARTMENT_OTHER): Payer: Self-pay

## 2022-09-06 DIAGNOSIS — M542 Cervicalgia: Secondary | ICD-10-CM | POA: Diagnosis not present

## 2022-09-06 DIAGNOSIS — M4316 Spondylolisthesis, lumbar region: Secondary | ICD-10-CM | POA: Diagnosis not present

## 2022-09-06 MED ORDER — COMIRNATY 30 MCG/0.3ML IM SUSY
PREFILLED_SYRINGE | INTRAMUSCULAR | 0 refills | Status: DC
  Filled 2022-09-06: qty 0.3, 1d supply, fill #0

## 2022-09-12 DIAGNOSIS — M4807 Spinal stenosis, lumbosacral region: Secondary | ICD-10-CM | POA: Diagnosis not present

## 2022-09-12 DIAGNOSIS — M419 Scoliosis, unspecified: Secondary | ICD-10-CM | POA: Diagnosis not present

## 2022-09-16 DIAGNOSIS — M4807 Spinal stenosis, lumbosacral region: Secondary | ICD-10-CM | POA: Diagnosis not present

## 2022-09-16 DIAGNOSIS — M419 Scoliosis, unspecified: Secondary | ICD-10-CM | POA: Diagnosis not present

## 2022-09-19 DIAGNOSIS — M4807 Spinal stenosis, lumbosacral region: Secondary | ICD-10-CM | POA: Diagnosis not present

## 2022-09-19 DIAGNOSIS — M419 Scoliosis, unspecified: Secondary | ICD-10-CM | POA: Diagnosis not present

## 2022-09-21 DIAGNOSIS — M419 Scoliosis, unspecified: Secondary | ICD-10-CM | POA: Diagnosis not present

## 2022-09-21 DIAGNOSIS — M4807 Spinal stenosis, lumbosacral region: Secondary | ICD-10-CM | POA: Diagnosis not present

## 2022-09-26 DIAGNOSIS — M419 Scoliosis, unspecified: Secondary | ICD-10-CM | POA: Diagnosis not present

## 2022-09-26 DIAGNOSIS — M4807 Spinal stenosis, lumbosacral region: Secondary | ICD-10-CM | POA: Diagnosis not present

## 2022-09-27 DIAGNOSIS — H35342 Macular cyst, hole, or pseudohole, left eye: Secondary | ICD-10-CM | POA: Diagnosis not present

## 2022-09-27 DIAGNOSIS — H35373 Puckering of macula, bilateral: Secondary | ICD-10-CM | POA: Diagnosis not present

## 2022-09-27 DIAGNOSIS — Z961 Presence of intraocular lens: Secondary | ICD-10-CM | POA: Diagnosis not present

## 2022-09-28 DIAGNOSIS — M419 Scoliosis, unspecified: Secondary | ICD-10-CM | POA: Diagnosis not present

## 2022-09-28 DIAGNOSIS — M4807 Spinal stenosis, lumbosacral region: Secondary | ICD-10-CM | POA: Diagnosis not present

## 2022-10-03 DIAGNOSIS — M4807 Spinal stenosis, lumbosacral region: Secondary | ICD-10-CM | POA: Diagnosis not present

## 2022-10-03 DIAGNOSIS — M419 Scoliosis, unspecified: Secondary | ICD-10-CM | POA: Diagnosis not present

## 2022-10-05 DIAGNOSIS — M4807 Spinal stenosis, lumbosacral region: Secondary | ICD-10-CM | POA: Diagnosis not present

## 2022-10-05 DIAGNOSIS — M419 Scoliosis, unspecified: Secondary | ICD-10-CM | POA: Diagnosis not present

## 2022-10-11 DIAGNOSIS — Z6821 Body mass index (BMI) 21.0-21.9, adult: Secondary | ICD-10-CM | POA: Diagnosis not present

## 2022-10-11 DIAGNOSIS — Z01419 Encounter for gynecological examination (general) (routine) without abnormal findings: Secondary | ICD-10-CM | POA: Diagnosis not present

## 2022-10-11 DIAGNOSIS — M8588 Other specified disorders of bone density and structure, other site: Secondary | ICD-10-CM | POA: Diagnosis not present

## 2022-10-11 DIAGNOSIS — N958 Other specified menopausal and perimenopausal disorders: Secondary | ICD-10-CM | POA: Diagnosis not present

## 2022-10-11 DIAGNOSIS — Z13820 Encounter for screening for osteoporosis: Secondary | ICD-10-CM | POA: Diagnosis not present

## 2022-10-11 DIAGNOSIS — R29890 Loss of height: Secondary | ICD-10-CM | POA: Diagnosis not present

## 2022-10-11 DIAGNOSIS — Z1231 Encounter for screening mammogram for malignant neoplasm of breast: Secondary | ICD-10-CM | POA: Diagnosis not present

## 2022-12-06 DIAGNOSIS — Z85828 Personal history of other malignant neoplasm of skin: Secondary | ICD-10-CM | POA: Diagnosis not present

## 2022-12-06 DIAGNOSIS — D485 Neoplasm of uncertain behavior of skin: Secondary | ICD-10-CM | POA: Diagnosis not present

## 2022-12-06 DIAGNOSIS — L570 Actinic keratosis: Secondary | ICD-10-CM | POA: Diagnosis not present

## 2022-12-07 DIAGNOSIS — R7989 Other specified abnormal findings of blood chemistry: Secondary | ICD-10-CM | POA: Diagnosis not present

## 2022-12-07 DIAGNOSIS — I10 Essential (primary) hypertension: Secondary | ICD-10-CM | POA: Diagnosis not present

## 2022-12-07 DIAGNOSIS — M8580 Other specified disorders of bone density and structure, unspecified site: Secondary | ICD-10-CM | POA: Diagnosis not present

## 2022-12-07 DIAGNOSIS — E785 Hyperlipidemia, unspecified: Secondary | ICD-10-CM | POA: Diagnosis not present

## 2022-12-07 DIAGNOSIS — D649 Anemia, unspecified: Secondary | ICD-10-CM | POA: Diagnosis not present

## 2022-12-08 DIAGNOSIS — H903 Sensorineural hearing loss, bilateral: Secondary | ICD-10-CM | POA: Diagnosis not present

## 2022-12-08 DIAGNOSIS — H6123 Impacted cerumen, bilateral: Secondary | ICD-10-CM | POA: Diagnosis not present

## 2022-12-14 DIAGNOSIS — D692 Other nonthrombocytopenic purpura: Secondary | ICD-10-CM | POA: Diagnosis not present

## 2022-12-14 DIAGNOSIS — R82998 Other abnormal findings in urine: Secondary | ICD-10-CM | POA: Diagnosis not present

## 2022-12-14 DIAGNOSIS — E785 Hyperlipidemia, unspecified: Secondary | ICD-10-CM | POA: Diagnosis not present

## 2022-12-14 DIAGNOSIS — Z8673 Personal history of transient ischemic attack (TIA), and cerebral infarction without residual deficits: Secondary | ICD-10-CM | POA: Diagnosis not present

## 2022-12-14 DIAGNOSIS — Q210 Ventricular septal defect: Secondary | ICD-10-CM | POA: Diagnosis not present

## 2022-12-14 DIAGNOSIS — Z1331 Encounter for screening for depression: Secondary | ICD-10-CM | POA: Diagnosis not present

## 2022-12-14 DIAGNOSIS — N3281 Overactive bladder: Secondary | ICD-10-CM | POA: Diagnosis not present

## 2022-12-14 DIAGNOSIS — M19042 Primary osteoarthritis, left hand: Secondary | ICD-10-CM | POA: Diagnosis not present

## 2022-12-14 DIAGNOSIS — M8580 Other specified disorders of bone density and structure, unspecified site: Secondary | ICD-10-CM | POA: Diagnosis not present

## 2022-12-14 DIAGNOSIS — G629 Polyneuropathy, unspecified: Secondary | ICD-10-CM | POA: Diagnosis not present

## 2022-12-14 DIAGNOSIS — I10 Essential (primary) hypertension: Secondary | ICD-10-CM | POA: Diagnosis not present

## 2022-12-14 DIAGNOSIS — Z0000 Encounter for general adult medical examination without abnormal findings: Secondary | ICD-10-CM | POA: Diagnosis not present

## 2022-12-14 DIAGNOSIS — R200 Anesthesia of skin: Secondary | ICD-10-CM | POA: Diagnosis not present

## 2022-12-14 DIAGNOSIS — Z1339 Encounter for screening examination for other mental health and behavioral disorders: Secondary | ICD-10-CM | POA: Diagnosis not present

## 2022-12-27 ENCOUNTER — Encounter (HOSPITAL_BASED_OUTPATIENT_CLINIC_OR_DEPARTMENT_OTHER): Payer: Self-pay | Admitting: Internal Medicine

## 2023-01-13 DIAGNOSIS — M2021 Hallux rigidus, right foot: Secondary | ICD-10-CM | POA: Diagnosis not present

## 2023-01-13 DIAGNOSIS — M79672 Pain in left foot: Secondary | ICD-10-CM | POA: Diagnosis not present

## 2023-01-13 DIAGNOSIS — M79671 Pain in right foot: Secondary | ICD-10-CM | POA: Diagnosis not present

## 2023-01-13 DIAGNOSIS — M7741 Metatarsalgia, right foot: Secondary | ICD-10-CM | POA: Diagnosis not present

## 2023-01-13 DIAGNOSIS — L84 Corns and callosities: Secondary | ICD-10-CM | POA: Diagnosis not present

## 2023-01-25 DIAGNOSIS — Z96643 Presence of artificial hip joint, bilateral: Secondary | ICD-10-CM | POA: Diagnosis not present

## 2023-01-25 DIAGNOSIS — M25561 Pain in right knee: Secondary | ICD-10-CM | POA: Diagnosis not present

## 2023-03-09 ENCOUNTER — Ambulatory Visit: Payer: PPO | Attending: Internal Medicine | Admitting: Internal Medicine

## 2023-03-09 ENCOUNTER — Encounter: Payer: Self-pay | Admitting: Internal Medicine

## 2023-03-09 VITALS — BP 132/62 | HR 65 | Ht 60.0 in | Wt 102.0 lb

## 2023-03-09 DIAGNOSIS — E782 Mixed hyperlipidemia: Secondary | ICD-10-CM | POA: Diagnosis not present

## 2023-03-09 DIAGNOSIS — I10 Essential (primary) hypertension: Secondary | ICD-10-CM | POA: Diagnosis not present

## 2023-03-09 DIAGNOSIS — I358 Other nonrheumatic aortic valve disorders: Secondary | ICD-10-CM | POA: Diagnosis not present

## 2023-03-09 DIAGNOSIS — R011 Cardiac murmur, unspecified: Secondary | ICD-10-CM | POA: Diagnosis not present

## 2023-03-09 DIAGNOSIS — Q210 Ventricular septal defect: Secondary | ICD-10-CM | POA: Diagnosis not present

## 2023-03-09 NOTE — Progress Notes (Signed)
OFFICE NOTE  Chief Complaint:  Follow-up  Primary Care Physician: Cleatis Polka., MD  HPI:  [name] is a pleasant 82 year old female who was previously followed by Dr. Clarene [name]. I last saw her in April 2014 for follow-up of a small membranous VSD. She also has very mild pulmonary hypertension. She does have of course a loud murmur which is associated with small VSDs. Her last echo was in 13 and EF was 55% or greater at the time. Normal wall motion was noted. There was mild mitral annular calcification with mild to moderate mitral regurgitation. There was mild tricuspid regurgitation as well. She is overall asymptomatic. She denies chest pain or worsening shortness of breath. She is on limited medications including aspirin, calcium and Myrbetriq.  03/21/2016  [name] returns today for follow-up. She's done extremely well. 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 In the past she's had mild pulmonary hypertension however her echo last year was not able to estimate pulmonary pressures. Her EF is remains stable. Overall she is doing well without any symptoms of shortness of breath or exercise intolerance. She denies any chest pain. She does have lower extremity varicose veins and has a history of vein stripping in the past. She occasionally gets some swelling of her legs.  04/11/2018  [name] was seen today in follow-up.'s been 2 years since I last saw her.  She has a stable small membranous VSD.  EF was unchanged by echo in 2017.  She denies worsening shortness of breath or chest pain or any change in exercise capacity.  She still struggles with varicose veins.  She also has some toe deformity on the left foot and is contemplating surgery.  She rarely gets swelling of her legs.  Interestingly her blood pressure has been elevated.  It was notably high in her PCPs office in December at 181/90.  She also saw her GYN who noted her blood pressure was  elevated.  In addition her blood pressure is elevated here.  She has a list of blood pressure she took at home which I reviewed.  While there are occasional blood pressures in the low 100s in general they are between 135-155 systolic.  Overall, I feel that she may be developing some hypertension, possibly due to arterial sclerosis or aging.  Her PCP had considered treating her blood pressure.  I think it is time for her to be treated.  Labs reviewed from November 2018 showed total cholesterol 205, HDL 79, LDL 114 and triglycerides 60.  07/03/2018  [name] returns today for follow-up.  She called in over the weekend noting that she had had some recent speech difficulty.  Her speech was garbled and it was noted by her husband [name] Mclean, MD who is a retired Midwife.  The episode was transient although very upsetting for her.  She had been on aspirin in the past but has not recently taken it.  In fact she is only on a few medications.  She is compliant with lisinopril and has seen a marked improvement in blood pressure 136/60.  According to her husband who did a brief neurologic exam she had no focal neurologic deficits.  She does have a history of VSD but no known atrial septal defect or PFO.  In addition she had mild to moderate bilateral carotid artery disease by ultrasound in 2016.  Denies any palpitations or arrhythmias that she is aware of.  08/20/2018  [name] returns today for follow-up.  She underwent 30-day monitoring which demonstrated a short run of tachycardia which was mildly irregular concerning for possible A. fib however duration was less than a few seconds.  She did not have any other episodes.  She did have an MRI which showed a hypointense lesion in the left parietal area suggestive of possible subacute stroke.  This may fit with her symptom pattern.  She is currently on aspirin however I am highly suspicious that this may have been a cardioembolic event.  I agree with her  neurologist recommendation to start Crestor 5 mg with goal LDL less than 70.  Based on the fact that there was very infrequent A. fib, there is not enough evidence to consider starting long-term anticoagulation at this point.  However due to my increased suspicions, I think she is a good candidate for an implanted loop recorder.  11/27/2018  [name] is seen today for routine follow-up as well as preoperative risk assessment.  She is doing well without any obvious recurrence of atrial fibrillation.  She did have an implanted loop recorder placed and to interrogation so far not demonstrated any A. fib.  She has been struggling with hip pain and is contemplating surgery in the next few weeks by Dr. Marlene [name] and [name].  From a cardiac standpoint I feel that she is acceptable risk for surgery.  We did perform an EKG today which shows normal sinus rhythm.  She was asked to hold her aspirin prior to the procedure and I would recommend holding it for a minimum of 7 days.  11/27/2019  [name] returns today for follow-up.  I recently had seen her for virtual visit.  I increased her lisinopril some more for elevated blood pressure.  She says at home it still runs in the 130s to 140s.  Currently she is only on 10 mg daily however she reports has been having some dry cough intermittently which could be side effect.  After an increase of her rosuvastatin by her PCP, her LDL now is 75, total cholesterol 166, triglycerides 47 HDL 82.  She denies any further stroke or TIA events.  Her implanted loop recorder has failed to show any significant arrhythmias.  10/22/2020  [name] is seen today in follow-up.  She continues to do well.  She has an upcoming surgery however with Dr.Hewitt due to concerns about arthritis and difficulty walking with her feet.  She has had interval improvement in her numbers.  Her cholesterol is down even further with a total 151, triglycerides 83, HDL 49 and LDL 58.  Her APO B was 51 and  A1c 4.9, these labs coming from Dr. Alver [name] office at [facility].  From a cardiac standpoint she should be okay to go ahead with her surgery.  03/02/2022  [name] returns today for follow-up.  Overall she seems to be doing fairly well.  She says she has not had any further TIA or stroke events although does have a fear of this happening.  Her implanted loop recorder continues to have battery life more than 3 years out and she is still having remote pacer checks.  These of all demonstrated no arrhythmias.  She tells me today that several weeks ago she had injury where she dropped a vacuum cleaner extension on her left leg.  Subsequent to that she developed a hematoma.  Then she was seen at orthopedic urgent care.  They did x-rays but no other work was done.  The hematoma then opened up and she developed a sore on the leg.  She has been putting mupirocin on it and covering with a Band-Aid.  I evaluated it today.  This appears to be a venous ulcer which is slowly healing.  She has notable lower extremity varicosities and this is in the area of the left gaiter area.  03/09/2023  [name] is seen today in follow-up.  She seems to be doing well.  Fortunately after working with the wound care clinic her leg ulcer has healed.  She has had no further presyncopal or neurologic episodes.  Her implanted loop recorder battery has timed out.  She elected to leave it in.  She saw my partner Dr. Salena Saner fairly recently who noted that she had a second sounding murmur that was different from her VSD.  He had suggested she have a repeat echo to rule out aortic stenosis.  Blood pressure is well-controlled.  Her lipids are at goal.  She denies any shortness of breath or chest pain.  PMHx:  Past Medical History:  Diagnosis Date   Arthritis    Heart murmur    Stroke     Past Surgical History:  Procedure Laterality Date   ABDOMINAL HYSTERECTOMY     age 61   APPENDECTOMY     DOPPLER ECHOCARDIOGRAPHY  2013    Duplex Doppler  2013   EXCISIONAL TOTAL HIP ARTHROPLASTY WITH ANTIBIOTIC SPACERS  09/25/2012   Procedure: EXCISIONAL TOTAL HIP ARTHROPLASTY WITH ANTIBIOTIC SPACERS;  Surgeon: [name], MD;  Location: [facility];  Service: Orthopedics;  Laterality: Left;  RESECTION LEFT TOTAL HIP ARTHROPLASTY AND PLACEMENT OF ANTIBIOTIC SPACERs   EYE SURGERY  02/2010   bilateral cataracts   INCISION AND DRAINAGE HIP  11/11/2011   Procedure: IRRIGATION AND DEBRIDEMENT HIP WITH POLY EXCHANGE;  Surgeon: [name];  Location: [facility];  Service: Orthopedics;  Laterality: Left;  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 She has never used smokeless tobacco. She reports current alcohol use of about 1.0 standard drink of alcohol per week. She reports that she does not use drugs.  ALLERGIES:  Allergies  Allergen Reactions   Bactrim Rash    ROS: Pertinent items noted in HPI and remainder of comprehensive ROS otherwise negative.  HOME MEDS: Current Outpatient Medications  Medication Sig Dispense Refill   aspirin EC 81 MG tablet Take 81 mg by mouth daily.     Calcium Carbonate-Vitamin D (CALCIUM + D PO) Take 1 tablet by mouth daily.      lisinopril (ZESTRIL) 5 MG tablet TAKE 1 TABLET BY MOUTH EVERY DAY 90 tablet 3   MYRBETRIQ 50 MG TB24 Take 50 mg by mouth daily.      rosuvastatin (CRESTOR) 10 MG tablet Take 10 mg by mouth daily.     No current facility-administered medications for this visit.     LABS/IMAGING: No results found for this or any  previous visit (from the past 48 hour(s)). No results found.  WEIGHTS: Wt Readings from Last 3 Encounters:  03/09/23 102 lb (46.3 kg)  08/17/22 103 lb 6.4 oz (46.9 kg)  03/02/22 103 lb 9.6 oz (47 kg)    VITALS: BP 132/62   Pulse 65   Ht 5' (1.524 m)   Wt 102 lb (46.3 kg)   SpO2 100%   BMI 19.92 kg/m   EXAM: General appearance: alert and no distress Neck: no carotid bruit, no JVD, and thyroid not enlarged, symmetric, no tenderness/mass/nodules Lungs: clear to auscultation bilaterally Heart: regular rate and rhythm, S1, S2 normal, and systolic murmur: holosystolic 3/6, blowing at 2nd right intercostal space Abdomen: soft, non-tender; bowel sounds normal; no masses,  no organomegaly Extremities: varicose veins noted and well-circumscribed left gaiter area venous stasis ulceration measuring about 1/2 inch in diameter and roughly 1/4 inch deep. Pulses: 2+ and symmetric Skin: Lower extremity venous stasis changes and ulceration as described above Neurologic: Grossly normal Psych: Pleasant  EKG: Normal sinus rhythm at 68-  personally reviewed  ASSESSMENT: Aortic murmur Left gaiter area venous stasis ulcer - healed Cryptogenic stroke-status post ILR Essential hypertension Dyslipidemia Small perimembranous VSD Mild pulmonary hypertension Varicose veins  PLAN: 1.   [name] seems to be doing well without any further strokelike symptoms.  She had no events noted on her loop recorder which is timed out.  She has a loud perimembranous VSD.  It was thought that she might also have a murmur of aortic stenosis.  I cannot discern any difference but note that the murmur is typically earlier peaking.  If she has AS it is very mild.  Will go ahead and repeat an echo since she has not had 1 in 5 years.  Follow-up with me annually or sooner as necessary.  Chrystie Nose, MD, [facility], FACP  [place]  [facility]  Medical  Director of the Advanced Lipid Disorders &   Cardiovascular Risk Reduction Clinic Diplomate of the American Board of Clinical Lipidology Attending Cardiologist  Direct Dial: [phone]  Fax: [phone]  Website:  [email or web address]   [name] 03/09/2023, 8:10 AM

## 2023-03-09 NOTE — Patient Instructions (Signed)
Medication Instructions:  Your physician recommends that you continue on your current medications as directed. Please refer to the Current Medication list given to you today.  *If you need a refill on your cardiac medications before your next appointment, please call your pharmacy*  Testing/Procedures: Your physician has requested that you have an echocardiogram. Echocardiography is a painless test that uses sound waves to create images of your heart. It provides your doctor with information about the size and shape of your heart and how well your heart's chambers and valves are working. This procedure takes approximately one hour. There are no restrictions for this procedure. Please do NOT wear cologne, perfume, aftershave, or lotions (deodorant is allowed). Please arrive 15 minutes prior to your appointment time.  Follow-Up: At [facility], you and your health needs are our priority.  As part of our continuing mission to provide you with exceptional heart care, we have created designated Provider Care Teams.  These Care Teams include your primary Cardiologist (physician) and Advanced Practice Providers (APPs -  Physician Assistants and Nurse Practitioners) who all work together to provide you with the care you need, when you need it.  We recommend signing up for the patient portal called "MyChart".  Sign up information is provided on this After Visit Summary.  MyChart is used to connect with patients for Virtual Visits (Telemedicine).  Patients are able to view lab/test results, encounter notes, upcoming appointments, etc.  Non-urgent messages can be sent to your provider as well.   To learn more about what you can do with MyChart, go to [email or web address].    Your next appointment:   12 month(s)  Provider:   Chrystie Nose, MD

## 2023-04-07 ENCOUNTER — Ambulatory Visit (HOSPITAL_COMMUNITY): Payer: PPO | Attending: Cardiology

## 2023-04-07 DIAGNOSIS — Q210 Ventricular septal defect: Secondary | ICD-10-CM

## 2023-04-07 DIAGNOSIS — R011 Cardiac murmur, unspecified: Secondary | ICD-10-CM

## 2023-04-07 LAB — ECHOCARDIOGRAM COMPLETE
Area-P 1/2: 3.42 cm2
Calc EF: 61.7 %
P 1/2 time: 410 msec
S' Lateral: 2.4 cm
Single Plane A2C EF: 59.1 %
Single Plane A4C EF: 63.9 %

## 2023-05-24 DIAGNOSIS — Z85828 Personal history of other malignant neoplasm of skin: Secondary | ICD-10-CM | POA: Diagnosis not present

## 2023-05-24 DIAGNOSIS — L817 Pigmented purpuric dermatosis: Secondary | ICD-10-CM | POA: Diagnosis not present

## 2023-05-24 DIAGNOSIS — L812 Freckles: Secondary | ICD-10-CM | POA: Diagnosis not present

## 2023-05-24 DIAGNOSIS — L821 Other seborrheic keratosis: Secondary | ICD-10-CM | POA: Diagnosis not present

## 2023-07-04 DIAGNOSIS — R351 Nocturia: Secondary | ICD-10-CM | POA: Diagnosis not present

## 2023-07-04 DIAGNOSIS — N3281 Overactive bladder: Secondary | ICD-10-CM | POA: Diagnosis not present

## 2023-08-03 ENCOUNTER — Other Ambulatory Visit (HOSPITAL_BASED_OUTPATIENT_CLINIC_OR_DEPARTMENT_OTHER): Payer: Self-pay

## 2023-08-03 MED ORDER — COMIRNATY 30 MCG/0.3ML IM SUSY
PREFILLED_SYRINGE | INTRAMUSCULAR | 0 refills | Status: AC
  Filled 2023-08-03: qty 0.3, 1d supply, fill #0

## 2023-08-03 MED ORDER — FLUAD 0.5 ML IM SUSY
PREFILLED_SYRINGE | INTRAMUSCULAR | 0 refills | Status: AC
  Filled 2023-08-03: qty 0.5, 1d supply, fill #0

## 2023-08-22 ENCOUNTER — Other Ambulatory Visit (HOSPITAL_BASED_OUTPATIENT_CLINIC_OR_DEPARTMENT_OTHER): Payer: Self-pay

## 2023-08-22 MED ORDER — RSVPREF3 VAC RECOMB ADJUVANTED 120 MCG/0.5ML IM SUSR
0.5000 mL | Freq: Once | INTRAMUSCULAR | 0 refills | Status: AC
  Filled 2023-08-22: qty 0.5, 1d supply, fill #0

## 2023-08-23 ENCOUNTER — Other Ambulatory Visit (HOSPITAL_BASED_OUTPATIENT_CLINIC_OR_DEPARTMENT_OTHER): Payer: Self-pay

## 2023-09-27 DIAGNOSIS — H35342 Macular cyst, hole, or pseudohole, left eye: Secondary | ICD-10-CM | POA: Diagnosis not present

## 2023-09-27 DIAGNOSIS — H35373 Puckering of macula, bilateral: Secondary | ICD-10-CM | POA: Diagnosis not present

## 2023-09-27 DIAGNOSIS — Z961 Presence of intraocular lens: Secondary | ICD-10-CM | POA: Diagnosis not present

## 2023-09-27 DIAGNOSIS — H524 Presbyopia: Secondary | ICD-10-CM | POA: Diagnosis not present

## 2023-09-27 DIAGNOSIS — H52223 Regular astigmatism, bilateral: Secondary | ICD-10-CM | POA: Diagnosis not present

## 2023-10-09 ENCOUNTER — Other Ambulatory Visit: Payer: Self-pay | Admitting: *Deleted

## 2023-10-09 MED ORDER — LISINOPRIL 5 MG PO TABS: 5.0000 mg | ORAL_TABLET | Freq: Every day | ORAL | 1 refills | Status: DC

## 2023-10-16 DIAGNOSIS — Z01419 Encounter for gynecological examination (general) (routine) without abnormal findings: Secondary | ICD-10-CM | POA: Diagnosis not present

## 2023-10-16 DIAGNOSIS — Z6821 Body mass index (BMI) 21.0-21.9, adult: Secondary | ICD-10-CM | POA: Diagnosis not present

## 2023-10-16 DIAGNOSIS — Z1231 Encounter for screening mammogram for malignant neoplasm of breast: Secondary | ICD-10-CM | POA: Diagnosis not present

## 2023-10-18 ENCOUNTER — Other Ambulatory Visit (HOSPITAL_COMMUNITY): Payer: Self-pay

## 2023-10-18 ENCOUNTER — Other Ambulatory Visit (HOSPITAL_BASED_OUTPATIENT_CLINIC_OR_DEPARTMENT_OTHER): Payer: Self-pay

## 2023-10-18 ENCOUNTER — Other Ambulatory Visit: Payer: Self-pay

## 2023-12-11 DIAGNOSIS — M2022 Hallux rigidus, left foot: Secondary | ICD-10-CM | POA: Diagnosis not present

## 2023-12-11 DIAGNOSIS — L84 Corns and callosities: Secondary | ICD-10-CM | POA: Diagnosis not present

## 2023-12-13 DIAGNOSIS — M19042 Primary osteoarthritis, left hand: Secondary | ICD-10-CM | POA: Diagnosis not present

## 2023-12-13 DIAGNOSIS — M19041 Primary osteoarthritis, right hand: Secondary | ICD-10-CM | POA: Diagnosis not present

## 2023-12-13 DIAGNOSIS — S63003A Unspecified subluxation of unspecified wrist and hand, initial encounter: Secondary | ICD-10-CM | POA: Diagnosis not present

## 2023-12-14 DIAGNOSIS — D649 Anemia, unspecified: Secondary | ICD-10-CM | POA: Diagnosis not present

## 2023-12-14 DIAGNOSIS — M8580 Other specified disorders of bone density and structure, unspecified site: Secondary | ICD-10-CM | POA: Diagnosis not present

## 2023-12-14 DIAGNOSIS — I10 Essential (primary) hypertension: Secondary | ICD-10-CM | POA: Diagnosis not present

## 2023-12-14 DIAGNOSIS — E785 Hyperlipidemia, unspecified: Secondary | ICD-10-CM | POA: Diagnosis not present

## 2023-12-20 ENCOUNTER — Other Ambulatory Visit (HOSPITAL_BASED_OUTPATIENT_CLINIC_OR_DEPARTMENT_OTHER): Payer: Self-pay

## 2023-12-21 DIAGNOSIS — Z23 Encounter for immunization: Secondary | ICD-10-CM | POA: Diagnosis not present

## 2023-12-21 DIAGNOSIS — Q210 Ventricular septal defect: Secondary | ICD-10-CM | POA: Diagnosis not present

## 2023-12-21 DIAGNOSIS — Z8673 Personal history of transient ischemic attack (TIA), and cerebral infarction without residual deficits: Secondary | ICD-10-CM | POA: Diagnosis not present

## 2023-12-21 DIAGNOSIS — M8580 Other specified disorders of bone density and structure, unspecified site: Secondary | ICD-10-CM | POA: Diagnosis not present

## 2023-12-21 DIAGNOSIS — M19042 Primary osteoarthritis, left hand: Secondary | ICD-10-CM | POA: Diagnosis not present

## 2023-12-21 DIAGNOSIS — N3281 Overactive bladder: Secondary | ICD-10-CM | POA: Diagnosis not present

## 2023-12-21 DIAGNOSIS — I10 Essential (primary) hypertension: Secondary | ICD-10-CM | POA: Diagnosis not present

## 2023-12-21 DIAGNOSIS — E785 Hyperlipidemia, unspecified: Secondary | ICD-10-CM | POA: Diagnosis not present

## 2023-12-21 DIAGNOSIS — G629 Polyneuropathy, unspecified: Secondary | ICD-10-CM | POA: Diagnosis not present

## 2023-12-21 DIAGNOSIS — Z1331 Encounter for screening for depression: Secondary | ICD-10-CM | POA: Diagnosis not present

## 2023-12-21 DIAGNOSIS — R82998 Other abnormal findings in urine: Secondary | ICD-10-CM | POA: Diagnosis not present

## 2023-12-21 DIAGNOSIS — Z1339 Encounter for screening examination for other mental health and behavioral disorders: Secondary | ICD-10-CM | POA: Diagnosis not present

## 2023-12-21 DIAGNOSIS — Z0000 Encounter for general adult medical examination without abnormal findings: Secondary | ICD-10-CM | POA: Diagnosis not present

## 2024-01-15 DIAGNOSIS — L84 Corns and callosities: Secondary | ICD-10-CM | POA: Diagnosis not present

## 2024-03-30 ENCOUNTER — Other Ambulatory Visit: Payer: Self-pay | Admitting: Cardiovascular Disease

## 2024-05-08 DIAGNOSIS — H6121 Impacted cerumen, right ear: Secondary | ICD-10-CM | POA: Diagnosis not present

## 2024-05-08 DIAGNOSIS — I10 Essential (primary) hypertension: Secondary | ICD-10-CM | POA: Diagnosis not present

## 2024-05-08 DIAGNOSIS — R490 Dysphonia: Secondary | ICD-10-CM | POA: Diagnosis not present

## 2024-05-08 DIAGNOSIS — R590 Localized enlarged lymph nodes: Secondary | ICD-10-CM | POA: Diagnosis not present

## 2024-05-08 DIAGNOSIS — R1319 Other dysphagia: Secondary | ICD-10-CM | POA: Diagnosis not present

## 2024-05-16 ENCOUNTER — Encounter: Payer: Self-pay | Admitting: Internal Medicine

## 2024-05-16 ENCOUNTER — Ambulatory Visit: Attending: Internal Medicine | Admitting: Internal Medicine

## 2024-05-16 VITALS — BP 142/68 | HR 63 | Ht 60.0 in | Wt 99.4 lb

## 2024-05-16 DIAGNOSIS — R011 Cardiac murmur, unspecified: Secondary | ICD-10-CM | POA: Diagnosis not present

## 2024-05-16 DIAGNOSIS — I358 Other nonrheumatic aortic valve disorders: Secondary | ICD-10-CM

## 2024-05-16 DIAGNOSIS — I10 Essential (primary) hypertension: Secondary | ICD-10-CM

## 2024-05-16 DIAGNOSIS — Q210 Ventricular septal defect: Secondary | ICD-10-CM | POA: Diagnosis not present

## 2024-05-16 NOTE — Progress Notes (Signed)
 OFFICE NOTE  Chief Complaint:  Follow-up  Primary Care Physician: [name]., MD  HPI:  [name] is a pleasant 83 year old female who was previously followed by [name]. I last saw her in April 2014 for follow-up of a small membranous VSD. She also has very mild pulmonary hypertension. She does have of course a loud murmur which is associated with small VSDs. Her last echo was in 13 and EF was 55% or greater at the time. Normal wall motion was noted. There was mild mitral annular calcification with mild to moderate mitral regurgitation. There was mild tricuspid regurgitation as well. She is overall asymptomatic. She denies chest pain or worsening shortness of breath. She is on limited medications including aspirin , calcium  and Myrbetriq.  03/21/2016  [name] returns today for follow-up. She's done extremely well. 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 In the past she's had mild pulmonary hypertension however her echo last year was not able to estimate pulmonary pressures. Her EF is remains stable. Overall she is doing well without any symptoms of shortness of breath or exercise intolerance. She denies any chest pain. She does have lower extremity varicose veins and has a history of vein stripping in the past. She occasionally gets some swelling of her legs.  04/11/2018  [name] was seen today in follow-up.'s been 2 years since I last saw her.  She has a stable small membranous VSD.  EF was unchanged by echo in 2017.  She denies worsening shortness of breath or chest pain or any change in exercise capacity.  She still struggles with varicose veins.  She also has some toe deformity on the left foot and is contemplating surgery.  She rarely gets swelling of her legs.  Interestingly her blood pressure has been elevated.  It was notably high in her PCPs office in December at 181/90.  She also saw her GYN who noted her blood pressure was  elevated.  In addition her blood pressure is elevated here.  She has a list of blood pressure she took at home which I reviewed.  While there are occasional blood pressures in the low 100s in general they are between 135-155 systolic.  Overall, I feel that she may be developing some hypertension, possibly due to arterial sclerosis or aging.  Her PCP had considered treating her blood pressure.  I think it is time for her to be treated.  Labs reviewed from November 2018 showed total cholesterol 205, HDL 79, LDL 114 and triglycerides 60.  07/03/2018  [name] returns today for follow-up.  She called in over the weekend noting that she had had some recent speech difficulty.  Her speech was garbled and it was noted by her husband [name] Pontes, MD who is a retired Midwife.  The episode was transient although very upsetting for her.  She had been on aspirin  in the past but has not recently taken it.  In fact she is only on a few medications.  She is compliant with lisinopril  and has seen a marked improvement in blood pressure 136/60.  According to her husband who did a brief neurologic exam she had no focal neurologic deficits.  She does have a history of VSD but no known atrial septal defect or PFO.  In addition she had mild to moderate bilateral carotid artery disease by ultrasound in 2016.  Denies any palpitations or arrhythmias that she is aware of.  08/20/2018  [name] returns today for follow-up.  She underwent 30-day monitoring which demonstrated a short run of tachycardia which was mildly irregular concerning for possible A. fib however duration was less than a few seconds.  She did not have any other episodes.  She did have an MRI which showed a hypointense lesion in the left parietal area suggestive of possible subacute stroke.  This may fit with her symptom pattern.  She is currently on aspirin  however I am highly suspicious that this may have been a cardioembolic event.  I agree with her  neurologist recommendation to start Crestor  5 mg with goal LDL less than 70.  Based on the fact that there was very infrequent A. fib, there is not enough evidence to consider starting long-term anticoagulation at this point.  However due to my increased suspicions, I think she is a good candidate for an implanted loop recorder.  11/27/2018  [name] is seen today for routine follow-up as well as preoperative risk assessment.  She is doing well without any obvious recurrence of atrial fibrillation.  She did have an implanted loop recorder placed and to interrogation so far not demonstrated any A. fib.  She has been struggling with hip pain and is contemplating surgery in the next few weeks by [name] and [name].  From a cardiac standpoint I feel that she is acceptable risk for surgery.  We did perform an EKG today which shows normal sinus rhythm.  She was asked to hold her aspirin  prior to the procedure and I would recommend holding it for a minimum of 7 days.  11/27/2019  [name] returns today for follow-up.  I recently had seen her for virtual visit.  I increased her lisinopril  some more for elevated blood pressure.  She says at home it still runs in the 130s to 140s.  Currently she is only on 10 mg daily however she reports has been having some dry cough intermittently which could be side effect.  After an increase of her rosuvastatin  by her PCP, her LDL now is 75, total cholesterol 166, triglycerides 47 HDL 82.  She denies any further stroke or TIA events.  Her implanted loop recorder has failed to show any significant arrhythmias.  10/22/2020  [name] is seen today in follow-up.  She continues to do well.  She has an upcoming surgery however with [name] due to concerns about arthritis and difficulty walking with her feet.  She has had interval improvement in her numbers.  Her cholesterol is down even further with a total 151, triglycerides 83, HDL 49 and LDL 58.  Her APO B was 51 and  A1c 4.9, these labs coming from [name] office at [facility]  From a cardiac standpoint she should be okay to go ahead with her surgery.  03/02/2022  [name] returns today for follow-up.  Overall she seems to be doing fairly well.  She says she has not had any further TIA or stroke events although does have a fear of this happening.  Her implanted loop recorder continues to have battery life more than 3 years out and she is still having remote pacer checks.  These of all demonstrated no arrhythmias.  She tells me today that several weeks ago she had injury where she dropped a vacuum cleaner extension on her left leg.  Subsequent to that she developed a hematoma.  Then she was seen at orthopedic urgent care.  They did x-rays but no other work was done.  The hematoma then opened up and she developed a sore on the leg.  She has been putting mupirocin  on it and covering with a Band-Aid.  I evaluated it today.  This appears to be a venous ulcer which is slowly healing.  She has notable lower extremity varicosities and this is in the area of the left gaiter area.  03/09/2023  [name] is seen today in follow-up.  She seems to be doing well.  Fortunately after working with the wound care clinic her leg ulcer has healed.  She has had no further presyncopal or neurologic episodes.  Her implanted loop recorder battery has timed out.  She elected to leave it in.  She saw my partner Dr. JAYSON fairly recently who noted that she had a second sounding murmur that was different from her VSD.  He had suggested she have a repeat echo to rule out aortic stenosis.  Blood pressure is well-controlled.  Her lipids are at goal.  She denies any shortness of breath or chest pain.  05/16/2024  [name] is seen today for follow-up.  Overall she seems to be doing fairly well.  She had some concerns about swallowing recently and saw her PCP about this and has a scheduled follow-up with ENT later this month.  She gets some  discomfort over her lateral ankles at times which I think is related to chronic venous disease.  She had a prior venous stasis ulcer.  She has a stable murmur associated with her VSD and by echo last year had aortic valve sclerosis without evidence of any stenosis.  The VSD is restrictive without any significant shunting.  Blood pressure was elevated somewhat today however she usually takes her blood pressure medicine in the afternoon.  PMHx:  Past Medical History:  Diagnosis Date   Arthritis    Heart murmur    Stroke [facility])     Past Surgical History:  Procedure Laterality Date   ABDOMINAL HYSTERECTOMY     age 64   APPENDECTOMY     DOPPLER ECHOCARDIOGRAPHY  2013   Duplex Doppler  2013   EXCISIONAL TOTAL HIP ARTHROPLASTY WITH ANTIBIOTIC SPACERS  09/25/2012   Procedure: EXCISIONAL TOTAL HIP ARTHROPLASTY WITH ANTIBIOTIC SPACERS;  Surgeon: Donnice JONETTA Car, MD;  Location: [facility];  Service: Orthopedics;  Laterality: Left;  RESECTION LEFT TOTAL HIP ARTHROPLASTY AND PLACEMENT OF ANTIBIOTIC SPACERs   EYE SURGERY  02/2010   bilateral cataracts   INCISION AND DRAINAGE HIP  11/11/2011   Procedure: IRRIGATION AND DEBRIDEMENT HIP WITH POLY EXCHANGE;  Surgeon: Donnice JONETTA Car;  Location: [facility];  Service: Orthopedics;  Laterality: Left;  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 She has never used smokeless tobacco. She reports current alcohol  use of about 1.0 standard drink of alcohol  per week. She reports that  she does not use drugs.  ALLERGIES:  Allergies  Allergen Reactions   Bactrim Rash   Sulfamethoxazole-Trimethoprim Dermatitis  ROS: Pertinent items noted in HPI and remainder of comprehensive ROS otherwise negative.  HOME MEDS: Current Outpatient Medications  Medication Sig Dispense Refill   aspirin  EC 81 MG tablet Take 81 mg by mouth daily.     Calcium  Carbonate-Vitamin D  (CALCIUM  + D PO) Take 1 tablet by mouth daily.      lisinopril  (ZESTRIL ) 5 MG tablet TAKE 1 TABLET (5 MG TOTAL) BY MOUTH DAILY. 90 tablet 0   MYRBETRIQ 50 MG TB24 Take 50 mg by mouth daily.      rosuvastatin  (CRESTOR ) 10 MG tablet Take 10 mg by mouth daily.     COVID-19 mRNA vaccine, Pfizer, (COMIRNATY ) syringe Inject into the muscle. 0.3 mL 0   influenza vaccine adjuvanted (FLUAD ) 0.5 ML injection Inject into the muscle. 0.5 mL 0   No current facility-administered medications for this visit.    LABS/IMAGING: No results found for this or any previous visit (from the past 48 hours). No results found.  WEIGHTS: Wt Readings from Last 3 Encounters:  05/16/24 99 lb 6.4 oz (45.1 kg)  03/09/23 102 lb (46.3 kg)  08/17/22 103 lb 6.4 oz (46.9 kg)    VITALS: BP (!) 142/68 (BP Location: Left Arm, Patient Position: Sitting)   Pulse 63   Ht 5' (1.524 m)   Wt 99 lb 6.4 oz (45.1 kg)   SpO2 98%   BMI 19.41 kg/m   EXAM: General appearance: alert and no distress Neck: no carotid bruit, no JVD, and thyroid  not enlarged, symmetric, no tenderness/mass/nodules Lungs: clear to auscultation bilaterally Heart: regular rate and rhythm, S1, S2 normal, and systolic murmur: holosystolic 3/6, blowing at 2nd right intercostal space Abdomen: soft, non-tender; bowel sounds normal; no masses,  no organomegaly Extremities: varicose veins noted and venous stasis dermatitis noted Pulses: 2+ and symmetric Skin: Lower extremity ecchymoses and hemosiderin deposition Neurologic: Grossly normal Psych: Pleasant  EKG: EKG  Interpretation Date/Time:  Thursday May 16 2024 09:00:20 EDT Ventricular Rate:  63 PR Interval:  168 QRS Duration:  70 QT Interval:  390 QTC Calculation: 399 R Axis:   66  Text Interpretation: Normal sinus rhythm Normal ECG When compared with ECG of 19-Oct-2009 16:55, No significant change was found Confirmed by Mona Kent [phone]) on 05/16/2024 9:12:10 AM    ASSESSMENT: Aortic sclerosis Left gaiter area venous stasis ulcer - healed Cryptogenic stroke-status post ILR Essential hypertension Dyslipidemia Small perimembranous VSD Mild pulmonary hypertension Varicose veins  PLAN: 1.   [name] seems to be doing well although recently has had some swallowing issues.  She has a plan to see ENT later this month.  She has a stable small perimembranous VSD without any significant shunting and aortic valve sclerosis.  No further stroke or TIA symptoms.  No further venous stasis ulcers although she reports some numbness and tingling of her lower extremities which is likely neuropathy.  No changes recommended today to her medications.  Follow-up with me annually or sooner as necessary.  Kent KYM Mona, MD, [facility], FNLA, FACP  [place]  [facility]  Medical Director of the Advanced Lipid Disorders &  Cardiovascular Risk Reduction Clinic Diplomate of the American Board of Clinical Lipidology Attending Cardiologist  Direct Dial: [phone]  Fax: [phone]  Website:  [email or web address]  Kent BROCKS Chanta Bauers 05/16/2024, 9:12 AM

## 2024-05-16 NOTE — Patient Instructions (Signed)
 Medication Instructions:  Your physician recommends that you continue on your current medications as directed. Please refer to the Current Medication list given to you today.  *If you need a refill on your cardiac medications before your next appointment, please call your pharmacy*  Follow-Up: At [facility], you and your health needs are our priority.  As part of our continuing mission to provide you with exceptional heart care, our providers are all part of one team.  This team includes your primary Cardiologist (physician) and Advanced Practice Providers or APPs (Physician Assistants and Nurse Practitioners) who all work together to provide you with the care you need, when you need it.  Your next appointment:   1 year follow up with Dr. Mona

## 2024-05-20 ENCOUNTER — Other Ambulatory Visit: Payer: Self-pay | Admitting: Registered Nurse

## 2024-05-20 DIAGNOSIS — R490 Dysphonia: Secondary | ICD-10-CM

## 2024-05-20 DIAGNOSIS — R1319 Other dysphagia: Secondary | ICD-10-CM

## 2024-05-20 DIAGNOSIS — R590 Localized enlarged lymph nodes: Secondary | ICD-10-CM

## 2024-05-23 ENCOUNTER — Ambulatory Visit
Admission: RE | Admit: 2024-05-23 | Discharge: 2024-05-23 | Disposition: A | Discharge status: Home / Self Care | Source: Ambulatory Visit | Attending: Registered Nurse | Admitting: Registered Nurse

## 2024-05-23 DIAGNOSIS — R590 Localized enlarged lymph nodes: Secondary | ICD-10-CM | POA: Diagnosis not present

## 2024-05-23 DIAGNOSIS — R490 Dysphonia: Secondary | ICD-10-CM

## 2024-05-23 DIAGNOSIS — R1319 Other dysphagia: Secondary | ICD-10-CM | POA: Diagnosis not present

## 2024-05-23 MED ORDER — IOPAMIDOL (ISOVUE-300) INJECTION 61%
75.0000 mL | Freq: Once | INTRAVENOUS | Status: AC | PRN
  Administered 2024-05-23: 75 mL via INTRAVENOUS

## 2024-06-06 DIAGNOSIS — D1801 Hemangioma of skin and subcutaneous tissue: Secondary | ICD-10-CM | POA: Diagnosis not present

## 2024-06-06 DIAGNOSIS — Z85828 Personal history of other malignant neoplasm of skin: Secondary | ICD-10-CM | POA: Diagnosis not present

## 2024-06-06 DIAGNOSIS — L720 Epidermal cyst: Secondary | ICD-10-CM | POA: Diagnosis not present

## 2024-06-06 DIAGNOSIS — L570 Actinic keratosis: Secondary | ICD-10-CM | POA: Diagnosis not present

## 2024-06-06 DIAGNOSIS — L812 Freckles: Secondary | ICD-10-CM | POA: Diagnosis not present

## 2024-06-06 DIAGNOSIS — D692 Other nonthrombocytopenic purpura: Secondary | ICD-10-CM | POA: Diagnosis not present

## 2024-06-06 DIAGNOSIS — L853 Xerosis cutis: Secondary | ICD-10-CM | POA: Diagnosis not present

## 2024-06-06 DIAGNOSIS — L821 Other seborrheic keratosis: Secondary | ICD-10-CM | POA: Diagnosis not present

## 2024-06-12 ENCOUNTER — Encounter (INDEPENDENT_AMBULATORY_CARE_PROVIDER_SITE_OTHER): Payer: Self-pay | Admitting: Otolaryngology

## 2024-06-12 ENCOUNTER — Ambulatory Visit (INDEPENDENT_AMBULATORY_CARE_PROVIDER_SITE_OTHER): Admitting: Otolaryngology

## 2024-06-12 VITALS — BP 118/63 | HR 77 | Ht 60.0 in | Wt 98.0 lb

## 2024-06-12 DIAGNOSIS — R1310 Dysphagia, unspecified: Secondary | ICD-10-CM | POA: Diagnosis not present

## 2024-06-12 DIAGNOSIS — H903 Sensorineural hearing loss, bilateral: Secondary | ICD-10-CM

## 2024-06-12 DIAGNOSIS — H6121 Impacted cerumen, right ear: Secondary | ICD-10-CM

## 2024-06-13 DIAGNOSIS — H903 Sensorineural hearing loss, bilateral: Secondary | ICD-10-CM | POA: Insufficient documentation

## 2024-06-13 DIAGNOSIS — H6121 Impacted cerumen, right ear: Secondary | ICD-10-CM | POA: Insufficient documentation

## 2024-06-13 DIAGNOSIS — R1310 Dysphagia, unspecified: Secondary | ICD-10-CM | POA: Insufficient documentation

## 2024-06-13 NOTE — Progress Notes (Signed)
 Patient ID: [name], female   DOB: 1940-12-11, 83 y.o.   MRN: [id number]  CC: Clogging sensation in the right ear, hearing loss, recurrent choking sensation   HPI: The patient is an 83 year old female who returns today for follow-up evaluation.  The patient was last seen in January 2024.  At that time, she was complaining of bilateral hearing loss.  She was noted to have bilateral high-frequency sensorineural hearing loss and cerumen impaction.  She was treated with cerumen disimpaction.  The hearing amplification options were discussed.  The patient presents today complaining of increasing clogging sensation in her right ear for the past 6 weeks.  She denies any recent change in her hearing.  In addition, she also complains of intermittent choking sensation in her throat.  Her neck CT scan was negative.  She was recently started empirically on pantoprazole.  Exam: General: Communicates without difficulty, well nourished, no acute distress. Head: Normocephalic, no evidence injury, no tenderness, facial buttresses intact without stepoff. Face/sinus: No tenderness to palpation and percussion. Facial movement is normal and symmetric. Eyes: PERRL, EOMI. No scleral icterus, conjunctivae clear. Neuro: CN II exam reveals vision grossly intact.  No nystagmus at any point of gaze. Ears: Auricles well formed without lesions.  Right ear cerumen impaction.  The left ear canal and tympanic membrane are normal.  Nose: External evaluation reveals normal support and skin without lesions.  Dorsum is intact.  Anterior rhinoscopy reveals congested mucosa over anterior aspect of inferior turbinates and intact septum.  No purulence noted. Oral:  Oral cavity and oropharynx are intact, symmetric, without erythema or edema.  Mucosa is moist without lesions. Neck: Full range of motion without pain.  There is no significant lymphadenopathy.  No masses palpable.  Thyroid  bed within normal limits to palpation.  Parotid glands  and submandibular glands equal bilaterally without mass.  Trachea is midline. Neuro:  CN 2-12 grossly intact.   Procedure: Right ear cerumen disimpaction Anesthesia: None Description: Under the operating microscope, the cerumen is carefully removed with a combination of cerumen currette, alligator forceps, and suction catheters.  After the cerumen is removed, the TMs are noted to be normal.  No mass, erythema, or lesions. The patient tolerated the procedure well.    Assessment: 1.  Right ear cerumen impaction.  After the disimpaction procedure, both tympanic membranes and middle ear spaces are noted to be normal. 2.  Subjectively stable bilateral high-frequency hearing loss. 3.  Dysphagia/choking sensation in throat.  Her recent neck CT scan was negative.  No obvious abnormality is noted on today's examination.  Plan: 1.  Otomicroscopy with right ear cerumen disimpaction. 2.  The physical exam findings are reviewed with the patient. 3.  Continue with her pantoprazole daily. 4.  The patient may see Dr. Tobie or Dr. Soldotova for further evaluation and treatment of her dysphagia.

## 2024-06-27 ENCOUNTER — Other Ambulatory Visit: Payer: Self-pay | Admitting: Cardiovascular Disease

## 2024-07-05 ENCOUNTER — Institutional Professional Consult (permissible substitution) (INDEPENDENT_AMBULATORY_CARE_PROVIDER_SITE_OTHER): Admitting: Otolaryngology

## 2024-07-09 ENCOUNTER — Encounter (INDEPENDENT_AMBULATORY_CARE_PROVIDER_SITE_OTHER): Payer: Self-pay | Admitting: Otolaryngology

## 2024-07-09 ENCOUNTER — Ambulatory Visit (INDEPENDENT_AMBULATORY_CARE_PROVIDER_SITE_OTHER): Admitting: Otolaryngology

## 2024-07-09 VITALS — BP 112/64 | HR 70

## 2024-07-09 DIAGNOSIS — R09A2 Foreign body sensation, throat: Secondary | ICD-10-CM | POA: Diagnosis not present

## 2024-07-09 DIAGNOSIS — J383 Other diseases of vocal cords: Secondary | ICD-10-CM

## 2024-07-09 DIAGNOSIS — R490 Dysphonia: Secondary | ICD-10-CM

## 2024-07-09 DIAGNOSIS — R0982 Postnasal drip: Secondary | ICD-10-CM | POA: Diagnosis not present

## 2024-07-09 NOTE — Progress Notes (Signed)
 ENT CONSULT:  Reason for Consult: hoarseness and globus sensation   HPI: Discussed the use of AI scribe software for clinical note transcription with the patient, who gave verbal consent to proceed.  History of Present Illness [name] is an 83 year old female who presents with hoarseness and globus sensation   She has been experiencing hoarseness and changes in her voice, which she initially attributed to age-related changes. These changes are particularly noticeable when singing in church, as her voice sounds different to her. Additionally, she occasionally feels a sensation when swallowing, especially when lying flat on her back.  She has a history of arthritis in her neck and back. She sought medical attention to rule out any underlying issues since she cannot visually inspect her throat herself. Prior to this visit, she consulted with PCP who ordered a CT neck scan, which returned negative results. She was prescribed a medication, PPI, to be taken for ten days without changes in her sx. Denies heartburn.   She mentions a personal theory that her loss of height and shifting of organs might be contributing to her symptoms. She acknowledges that she should exercise more and has dietary habits that include eating cheese crackers and occasionally drinking wine.   Records Reviewed:  Cards visit with Dr Mona 05/16/24 [name] is a pleasant 83 year old female who was previously followed by Dr. Morgan. I last saw her in April 2014 for follow-up of a small membranous VSD. She also has very mild pulmonary hypertension. She does have of course a loud murmur which is associated with small VSDs. Her last echo was in 13 and EF was 55% or greater at the time. Normal wall motion was noted. There was mild mitral annular calcification with mild to moderate mitral regurgitation. There was mild tricuspid regurgitation as well. She is overall asymptomatic. She denies chest pain or worsening  shortness of breath. She is on limited medications including aspirin , calcium  and Myrbetriq.    1. [name] seems to be doing well although recently has had some swallowing issues. She has a plan to see ENT later this month. She has a stable small perimembranous VSD without any significant shunting and aortic valve sclerosis. No further stroke or TIA symptoms. No further venous stasis ulcers although she reports some numbness and tingling of her lower extremities which is likely neuropathy. No changes recommended today to her medications.  Past Medical History:  Diagnosis Date   Arthritis    Heart murmur    Stroke [name])     Past Surgical History:  Procedure Laterality Date   ABDOMINAL HYSTERECTOMY     age 63   APPENDECTOMY     DOPPLER ECHOCARDIOGRAPHY  2013   Duplex Doppler  2013   EXCISIONAL TOTAL HIP ARTHROPLASTY WITH ANTIBIOTIC SPACERS  09/25/2012   Procedure: EXCISIONAL TOTAL HIP ARTHROPLASTY WITH ANTIBIOTIC SPACERS;  Surgeon: Donnice JONETTA Car, MD;  Location: [facility];  Service: Orthopedics;  Laterality: Left;  RESECTION LEFT TOTAL HIP ARTHROPLASTY AND PLACEMENT OF ANTIBIOTIC SPACERs   EYE SURGERY  02/2010   bilateral cataracts   INCISION AND DRAINAGE HIP  11/11/2011   Procedure: IRRIGATION AND DEBRIDEMENT HIP WITH POLY EXCHANGE;  Surgeon: Donnice JONETTA Car;  Location: [facility];  Service: Orthopedics;  Laterality: Left;  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 She has never used smokeless  tobacco. She reports current alcohol  use of about 1.0 standard drink of alcohol  per week. She reports that she does not use drugs.  Allergies:  Allergies  Allergen Reactions   Bactrim Rash   Sulfamethoxazole-Trimethoprim Dermatitis    Medications: I have reviewed the patient's current medications.  The PMH, PSH, Medications, Allergies, and SH were reviewed and updated.  ROS: Constitutional: Negative for fever, weight loss and weight gain. Cardiovascular: Negative for chest pain and dyspnea on exertion. Respiratory: Is not experiencing shortness of breath at rest. Gastrointestinal: Negative for nausea and vomiting. Neurological: Negative for headaches. Psychiatric: The patient is not nervous/anxious  Blood pressure 112/64, pulse 70, SpO2 97%. There is no height or weight on file to calculate BMI.  PHYSICAL EXAM:  Exam: General: Well-developed, well-nourished Communication and Voice: Clear pitch and clarity Respiratory Respiratory effort: Equal inspiration and expiration without stridor Cardiovascular Peripheral Vascular: Warm extremities with equal color/perfusion Eyes: No nystagmus with equal extraocular motion bilaterally Neuro/Psych/Balance: Patient oriented to person, place, and time; Appropriate mood and affect; Gait is intact with no imbalance; Cranial nerves I-XII are intact Head and Face Inspection: Normocephalic and atraumatic without mass or lesion Palpation: Facial skeleton intact without bony stepoffs Salivary Glands: No mass or tenderness Facial Strength: Facial motility symmetric and full bilaterally ENT Pinna: External ear intact and fully developed External canal: Canal is patent with intact skin Tympanic Membrane: Clear and mobile External Nose: No scar or anatomic deformity Internal Nose: Septum is straight. No polyp, or purulence. Mucosal edema and erythema present.  Bilateral inferior turbinate hypertrophy.  Lips, Teeth, and gums: Mucosa and teeth  intact and viable TMJ: No pain to palpation with full mobility Oral cavity/oropharynx: No erythema or exudate, no lesions present Nasopharynx: No mass or lesion with intact mucosa Hypopharynx: Intact mucosa without pooling of secretions Larynx Glottic: Full true vocal cord mobility without lesion or mass VF atrophy Supraglottic: Normal appearing epiglottis and AE folds Interarytenoid Space: Moderate pachydermia&edema Subglottic Space: Patent without lesion or edema Neck Neck and Trachea: Midline trachea without mass or lesion Thyroid : No mass or nodularity Lymphatics: No lymphadenopathy  Procedure: Preoperative diagnosis: globus and dysphonia  Postoperative diagnosis:   Same  VF atrophy and GERD LPR  Procedure: Flexible fiberoptic laryngoscopy  Surgeon: Elena Larry, MD  Anesthesia: Topical lidocaine  and Afrin Complications: None Condition is stable throughout exam  Indications and consent:  The patient presents to the clinic with above symptoms. Indirect laryngoscopy view was incomplete. Thus it was recommended that they undergo a flexible fiberoptic laryngoscopy. All of the risks, benefits, and potential complications were reviewed with the patient preoperatively and verbal informed consent was obtained.  Procedure: The patient was seated upright in the clinic. Topical lidocaine  and Afrin were applied to the nasal cavity. After adequate anesthesia had occurred, I then proceeded to pass the flexible telescope into the nasal cavity. The nasal cavity was patent without rhinorrhea or polyp. The nasopharynx was also patent without mass or lesion. The base of tongue was visualized and was normal. There were no signs of pooling of secretions in the piriform sinuses.  The true vocal folds were mobile bilaterally. There were no signs of glottic or supraglottic mucosal lesion or mass. There was moderate interarytenoid pachydermia and post cricoid edema. The telescope was then slowly  withdrawn and the patient tolerated the procedure throughout.    Studies Reviewed: CT neck 05/23/24 IMPRESSION: There is no evidence of mass or abnormal enhancement along the visualized aerodigestive structures in the neck.   Mildly prominent right level 2A cervical node without other suspicious features, likely reactive  Assessment/Plan: Encounter Diagnoses  Name Primary?   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 We discussed voice therapy but she would like to hold off at this time - will monitor for now  Globus sensation and GERD LPR Intermittent globus sensation likely from laryngopharyngeal reflux. Previous acid-reducing medication ineffective. Mild reflux irritation noted on scope exam today -  Reflux Gourmet after meals - diet and lifestyle changes to minimize GERD - Refer to [employer] blog for dietary and lifestyle modifications/reflux cook book  Post-nasal drainage Mild postnasal drainage observed, not causing significant symptoms. - Offer nasal spray for postnasal drainage if desired. She would like to hold off for now    Thank you for allowing me to participate in the care of this patient. Please do not hesitate to contact me with any questions or concerns.   Elena Larry, MD Otolaryngology [name] Phone: [phone] Fax: [phone]    07/09/2024, 3:12 PM

## 2024-07-09 NOTE — Patient Instructions (Signed)

## 2024-08-01 ENCOUNTER — Other Ambulatory Visit (HOSPITAL_BASED_OUTPATIENT_CLINIC_OR_DEPARTMENT_OTHER): Payer: Self-pay

## 2024-08-01 MED ORDER — COMIRNATY 30 MCG/0.3ML IM SUSY
0.3000 mL | PREFILLED_SYRINGE | Freq: Once | INTRAMUSCULAR | 0 refills | Status: AC
Start: 2024-08-01 — End: 2024-08-02
  Filled 2024-08-01: qty 0.3, 1d supply, fill #0

## 2024-08-01 MED ORDER — FLUZONE HIGH-DOSE 0.5 ML IM SUSY
0.5000 mL | PREFILLED_SYRINGE | Freq: Once | INTRAMUSCULAR | 0 refills | Status: AC
Start: 2024-08-01 — End: 2024-08-02
  Filled 2024-08-01: qty 0.5, 1d supply, fill #0

## 2024-08-02 DIAGNOSIS — Z96641 Presence of right artificial hip joint: Secondary | ICD-10-CM | POA: Diagnosis not present

## 2024-08-02 DIAGNOSIS — Z96642 Presence of left artificial hip joint: Secondary | ICD-10-CM | POA: Diagnosis not present

## 2024-08-12 DIAGNOSIS — N3281 Overactive bladder: Secondary | ICD-10-CM | POA: Diagnosis not present

## 2024-08-12 DIAGNOSIS — R351 Nocturia: Secondary | ICD-10-CM | POA: Diagnosis not present

## 2024-09-25 DIAGNOSIS — H52221 Regular astigmatism, right eye: Secondary | ICD-10-CM | POA: Diagnosis not present

## 2024-09-25 DIAGNOSIS — H35342 Macular cyst, hole, or pseudohole, left eye: Secondary | ICD-10-CM | POA: Diagnosis not present

## 2024-09-25 DIAGNOSIS — H524 Presbyopia: Secondary | ICD-10-CM | POA: Diagnosis not present

## 2024-09-25 DIAGNOSIS — H5212 Myopia, left eye: Secondary | ICD-10-CM | POA: Diagnosis not present

## 2024-09-25 DIAGNOSIS — H35373 Puckering of macula, bilateral: Secondary | ICD-10-CM | POA: Diagnosis not present

## 2024-09-25 DIAGNOSIS — Z961 Presence of intraocular lens: Secondary | ICD-10-CM | POA: Diagnosis not present

## 2024-11-20 NOTE — Progress Notes (Signed)
 [name]                                          MRN: [id number]   11/20/2024   The [facility] Quality Team Specialist reviewed this patient medical record for the purposes of chart review for care gap closure. The following were reviewed: abstraction for care gap closure-controlling blood pressure.    [facility] Quality Team

## 2024-12-10 ENCOUNTER — Other Ambulatory Visit: Payer: Self-pay | Admitting: Obstetrics and Gynecology

## 2024-12-10 DIAGNOSIS — R928 Other abnormal and inconclusive findings on diagnostic imaging of breast: Secondary | ICD-10-CM

## 2024-12-17 ENCOUNTER — Other Ambulatory Visit

## 2024-12-17 ENCOUNTER — Encounter

## 2024-12-18 ENCOUNTER — Other Ambulatory Visit: Payer: Self-pay | Admitting: Cardiovascular Disease

## 2024-12-20 NOTE — Telephone Encounter (Signed)
 In accordance with refill protocols, please review and address the following requirements before this medication refill can be authorized:  Labs  Pt needs labs within 365 days within normal range

## 2024-12-24 ENCOUNTER — Encounter

## 2024-12-24 ENCOUNTER — Other Ambulatory Visit
